# Patient Record
Sex: Female | Born: 1935 | Race: White | Hispanic: No | Smoking: Never smoker
Health system: Southern US, Community
[De-identification: ages and names within clinical notes are randomized; demographics above are authoritative.]

## PROBLEM LIST (undated history)

## (undated) DIAGNOSIS — H409 Unspecified glaucoma: Secondary | ICD-10-CM

## (undated) DIAGNOSIS — T8859XA Other complications of anesthesia, initial encounter: Secondary | ICD-10-CM

## (undated) DIAGNOSIS — T4145XA Adverse effect of unspecified anesthetic, initial encounter: Secondary | ICD-10-CM

## (undated) HISTORY — PX: CHOLECYSTECTOMY: SHX55

## (undated) HISTORY — PX: MASTOIDECTOMY: SHX711

## (undated) HISTORY — PX: ABDOMINAL HYSTERECTOMY: SHX81

---

## 1998-07-04 ENCOUNTER — Ambulatory Visit (HOSPITAL_COMMUNITY): Admission: RE | Admit: 1998-07-04 | Discharge: 1998-07-04 | Payer: Self-pay | Admitting: Obstetrics and Gynecology

## 1998-12-24 ENCOUNTER — Ambulatory Visit (HOSPITAL_COMMUNITY): Admission: RE | Admit: 1998-12-24 | Discharge: 1998-12-24 | Payer: Self-pay | Admitting: *Deleted

## 1999-07-25 ENCOUNTER — Ambulatory Visit (HOSPITAL_COMMUNITY): Admission: RE | Admit: 1999-07-25 | Discharge: 1999-07-25 | Payer: Self-pay | Admitting: Obstetrics and Gynecology

## 2000-08-02 ENCOUNTER — Ambulatory Visit (HOSPITAL_COMMUNITY): Admission: RE | Admit: 2000-08-02 | Discharge: 2000-08-02 | Payer: Self-pay | Admitting: Family Medicine

## 2000-08-09 ENCOUNTER — Encounter: Admission: RE | Admit: 2000-08-09 | Discharge: 2000-08-09 | Payer: Self-pay | Admitting: Family Medicine

## 2000-08-09 ENCOUNTER — Encounter: Payer: Self-pay | Admitting: Family Medicine

## 2001-08-15 ENCOUNTER — Ambulatory Visit (HOSPITAL_COMMUNITY): Admission: RE | Admit: 2001-08-15 | Discharge: 2001-08-15 | Payer: Self-pay | Admitting: Family Medicine

## 2002-09-18 ENCOUNTER — Ambulatory Visit (HOSPITAL_COMMUNITY): Admission: RE | Admit: 2002-09-18 | Discharge: 2002-09-18 | Payer: Self-pay | Admitting: Family Medicine

## 2002-09-18 ENCOUNTER — Encounter: Payer: Self-pay | Admitting: Family Medicine

## 2010-11-08 ENCOUNTER — Encounter: Payer: Self-pay | Admitting: Family Medicine

## 2013-09-28 ENCOUNTER — Emergency Department (HOSPITAL_COMMUNITY): Payer: Medicare Other

## 2013-09-28 ENCOUNTER — Inpatient Hospital Stay (HOSPITAL_COMMUNITY): Payer: Medicare Other

## 2013-09-28 ENCOUNTER — Inpatient Hospital Stay (HOSPITAL_COMMUNITY): Payer: Medicare Other | Admitting: Anesthesiology

## 2013-09-28 ENCOUNTER — Encounter (HOSPITAL_COMMUNITY): Payer: Self-pay | Admitting: Emergency Medicine

## 2013-09-28 ENCOUNTER — Inpatient Hospital Stay (HOSPITAL_COMMUNITY)
Admission: EM | Admit: 2013-09-28 | Discharge: 2013-10-02 | DRG: 470 | Disposition: A | Discharge status: Skilled Nursing Facility | Payer: Medicare Other | Attending: Internal Medicine | Admitting: Internal Medicine

## 2013-09-28 ENCOUNTER — Encounter (HOSPITAL_COMMUNITY): Payer: Medicare Other | Admitting: Anesthesiology

## 2013-09-28 ENCOUNTER — Encounter (HOSPITAL_COMMUNITY)
Admission: EM | Disposition: A | Discharge status: Skilled Nursing Facility | Payer: Self-pay | Source: Home / Self Care | Attending: Internal Medicine

## 2013-09-28 DIAGNOSIS — I10 Essential (primary) hypertension: Secondary | ICD-10-CM | POA: Diagnosis present

## 2013-09-28 DIAGNOSIS — K5900 Constipation, unspecified: Secondary | ICD-10-CM

## 2013-09-28 DIAGNOSIS — Z79899 Other long term (current) drug therapy: Secondary | ICD-10-CM

## 2013-09-28 DIAGNOSIS — N390 Urinary tract infection, site not specified: Secondary | ICD-10-CM | POA: Diagnosis present

## 2013-09-28 DIAGNOSIS — Z23 Encounter for immunization: Secondary | ICD-10-CM

## 2013-09-28 DIAGNOSIS — S72033A Displaced midcervical fracture of unspecified femur, initial encounter for closed fracture: Principal | ICD-10-CM | POA: Diagnosis present

## 2013-09-28 DIAGNOSIS — Y92009 Unspecified place in unspecified non-institutional (private) residence as the place of occurrence of the external cause: Secondary | ICD-10-CM

## 2013-09-28 DIAGNOSIS — H409 Unspecified glaucoma: Secondary | ICD-10-CM | POA: Diagnosis present

## 2013-09-28 DIAGNOSIS — S72009A Fracture of unspecified part of neck of unspecified femur, initial encounter for closed fracture: Secondary | ICD-10-CM

## 2013-09-28 DIAGNOSIS — S72002P Fracture of unspecified part of neck of left femur, subsequent encounter for closed fracture with malunion: Secondary | ICD-10-CM

## 2013-09-28 DIAGNOSIS — R296 Repeated falls: Secondary | ICD-10-CM | POA: Diagnosis present

## 2013-09-28 HISTORY — DX: Other complications of anesthesia, initial encounter: T88.59XA

## 2013-09-28 HISTORY — DX: Unspecified glaucoma: H40.9

## 2013-09-28 HISTORY — PX: HIP ARTHROPLASTY: SHX981

## 2013-09-28 HISTORY — DX: Adverse effect of unspecified anesthetic, initial encounter: T41.45XA

## 2013-09-28 LAB — CBC WITH DIFFERENTIAL/PLATELET
Basophils Absolute: 0 10*3/uL (ref 0.0–0.1)
Basophils Relative: 0 % (ref 0–1)
Eosinophils Absolute: 0 10*3/uL (ref 0.0–0.7)
HCT: 40 % (ref 36.0–46.0)
Hemoglobin: 13.3 g/dL (ref 12.0–15.0)
Lymphocytes Relative: 8 % — ABNORMAL LOW (ref 12–46)
Lymphs Abs: 1 10*3/uL (ref 0.7–4.0)
MCHC: 33.3 g/dL (ref 30.0–36.0)
Monocytes Absolute: 0.6 10*3/uL (ref 0.1–1.0)
Neutrophils Relative %: 87 % — ABNORMAL HIGH (ref 43–77)

## 2013-09-28 LAB — BASIC METABOLIC PANEL
BUN: 20 mg/dL (ref 6–23)
CO2: 24 mEq/L (ref 19–32)
GFR calc Af Amer: >90 mL/min (ref >90)
GFR calc non Af Amer: 81 mL/min — ABNORMAL LOW (ref >90)
Potassium: 3.4 mEq/L — ABNORMAL LOW (ref 3.5–5.1)
Sodium: 143 mEq/L (ref 135–145)

## 2013-09-28 LAB — ABO/RH: ABO/RH(D): O POS

## 2013-09-28 LAB — URINALYSIS, ROUTINE W REFLEX MICROSCOPIC
Bilirubin Urine: NEGATIVE
Glucose, UA: NEGATIVE mg/dL
Hgb urine dipstick: NEGATIVE
Ketones, ur: NEGATIVE mg/dL
pH: 7 (ref 5.0–8.0)

## 2013-09-28 LAB — PROTIME-INR
INR: 0.89 (ref 0.00–1.49)
Prothrombin Time: 11.9 seconds (ref 11.6–15.2)

## 2013-09-28 LAB — APTT: aPTT: 29 seconds (ref 24–37)

## 2013-09-28 LAB — URINE MICROSCOPIC-ADD ON

## 2013-09-28 LAB — TYPE AND SCREEN

## 2013-09-28 SURGERY — HEMIARTHROPLASTY, HIP, DIRECT ANTERIOR APPROACH, FOR FRACTURE
Anesthesia: General | Site: Hip | Laterality: Left

## 2013-09-28 MED ORDER — GLYCOPYRROLATE 0.2 MG/ML IJ SOLN
INTRAMUSCULAR | Status: DC | PRN
  Administered 2013-09-28: 0.4 mg via INTRAVENOUS

## 2013-09-28 MED ORDER — INFLUENZA VAC SPLIT QUAD 0.5 ML IM SUSP: 0.5000 mL | INTRAMUSCULAR | Status: DC

## 2013-09-28 MED ORDER — HYDRALAZINE HCL 20 MG/ML IJ SOLN
INTRAMUSCULAR | Status: AC
  Filled 2013-09-28: qty 2

## 2013-09-28 MED ORDER — ONDANSETRON HCL 4 MG PO TABS: 4.0000 mg | ORAL_TABLET | Freq: Four times a day (QID) | ORAL | Status: DC | PRN

## 2013-09-28 MED ORDER — EPHEDRINE SULFATE 50 MG/ML IJ SOLN
INTRAMUSCULAR | Status: AC
  Filled 2013-09-28: qty 1

## 2013-09-28 MED ORDER — SUCCINYLCHOLINE CHLORIDE 20 MG/ML IJ SOLN
INTRAMUSCULAR | Status: AC
  Filled 2013-09-28: qty 1

## 2013-09-28 MED ORDER — DEXTROSE 5 % IV SOLN
1.0000 g | INTRAVENOUS | Status: DC
  Administered 2013-09-28: 1 g via INTRAVENOUS
  Filled 2013-09-28: qty 10

## 2013-09-28 MED ORDER — HYDROCODONE-ACETAMINOPHEN 5-325 MG PO TABS: 1.0000 | ORAL_TABLET | Freq: Four times a day (QID) | ORAL | Status: DC | PRN

## 2013-09-28 MED ORDER — HYDROCODONE-ACETAMINOPHEN 5-325 MG PO TABS: 1.0000 | ORAL_TABLET | ORAL | Status: DC | PRN

## 2013-09-28 MED ORDER — CEFAZOLIN SODIUM-DEXTROSE 2-3 GM-% IV SOLR: INTRAVENOUS | Status: DC | PRN

## 2013-09-28 MED ORDER — ONDANSETRON HCL 4 MG/2ML IJ SOLN: 4.0000 mg | Freq: Four times a day (QID) | INTRAMUSCULAR | Status: DC | PRN

## 2013-09-28 MED ORDER — CEFAZOLIN SODIUM-DEXTROSE 2-3 GM-% IV SOLR: 2.0000 g | Freq: Once | INTRAVENOUS | Status: DC

## 2013-09-28 MED ORDER — CEFAZOLIN SODIUM-DEXTROSE 2-3 GM-% IV SOLR
INTRAVENOUS | Status: DC | PRN
  Administered 2013-09-28: 2 g via INTRAVENOUS

## 2013-09-28 MED ORDER — SODIUM CHLORIDE 0.9 % IJ SOLN
INTRAMUSCULAR | Status: AC
  Filled 2013-09-28: qty 10

## 2013-09-28 MED ORDER — ENOXAPARIN SODIUM 30 MG/0.3ML ~~LOC~~ SOLN: 40.0000 mg | SUBCUTANEOUS | Status: DC

## 2013-09-28 MED ORDER — FENTANYL CITRATE 0.05 MG/ML IJ SOLN
INTRAMUSCULAR | Status: AC
  Filled 2013-09-28: qty 2

## 2013-09-28 MED ORDER — ACETAMINOPHEN 325 MG PO TABS
650.0000 mg | ORAL_TABLET | Freq: Four times a day (QID) | ORAL | Status: DC | PRN
  Administered 2013-09-29 – 2013-10-01 (×6): 650 mg via ORAL
  Filled 2013-09-28 (×5): qty 2

## 2013-09-28 MED ORDER — BRINZOLAMIDE 1 % OP SUSP
1.0000 [drp] | Freq: Two times a day (BID) | OPHTHALMIC | Status: DC
  Administered 2013-09-28 – 2013-10-02 (×6): 1 [drp] via OPHTHALMIC
  Filled 2013-09-28: qty 10

## 2013-09-28 MED ORDER — ACETAMINOPHEN 650 MG RE SUPP: 650.0000 mg | Freq: Four times a day (QID) | RECTAL | Status: DC | PRN

## 2013-09-28 MED ORDER — PROPOFOL 10 MG/ML IV BOLUS
INTRAVENOUS | Status: AC
  Filled 2013-09-28: qty 20

## 2013-09-28 MED ORDER — 0.9 % SODIUM CHLORIDE (POUR BTL) OPTIME
TOPICAL | Status: DC | PRN
  Administered 2013-09-28: 1000 mL

## 2013-09-28 MED ORDER — NEOSTIGMINE METHYLSULFATE 1 MG/ML IJ SOLN
INTRAMUSCULAR | Status: DC | PRN
  Administered 2013-09-28: 3 mg via INTRAVENOUS

## 2013-09-28 MED ORDER — HYDRALAZINE HCL 20 MG/ML IJ SOLN: 5.0000 mg | Freq: Three times a day (TID) | INTRAMUSCULAR | Status: DC | PRN

## 2013-09-28 MED ORDER — LACTATED RINGERS IV SOLN
INTRAVENOUS | Status: DC | PRN
  Administered 2013-09-28: 19:00:00 via INTRAVENOUS

## 2013-09-28 MED ORDER — CEFAZOLIN SODIUM 1-5 GM-% IV SOLN
1.0000 g | Freq: Four times a day (QID) | INTRAVENOUS | Status: AC
  Administered 2013-09-28 – 2013-09-29 (×2): 1 g via INTRAVENOUS
  Filled 2013-09-28 (×2): qty 50

## 2013-09-28 MED ORDER — PHENYLEPHRINE 40 MCG/ML (10ML) SYRINGE FOR IV PUSH (FOR BLOOD PRESSURE SUPPORT)
PREFILLED_SYRINGE | INTRAVENOUS | Status: AC
  Filled 2013-09-28: qty 10

## 2013-09-28 MED ORDER — FENTANYL CITRATE 0.05 MG/ML IJ SOLN
25.0000 ug | INTRAMUSCULAR | Status: DC | PRN
Start: 2013-09-28 — End: 2013-09-28

## 2013-09-28 MED ORDER — BUPIVACAINE HCL 0.5 % IJ SOLN
INTRAMUSCULAR | Status: DC | PRN
  Administered 2013-09-28: 5 mL

## 2013-09-28 MED ORDER — CEFAZOLIN SODIUM-DEXTROSE 2-3 GM-% IV SOLR
INTRAVENOUS | Status: AC
  Filled 2013-09-28: qty 50

## 2013-09-28 MED ORDER — FENTANYL CITRATE 0.05 MG/ML IJ SOLN
INTRAMUSCULAR | Status: DC | PRN
  Administered 2013-09-28 (×4): 50 ug via INTRAVENOUS

## 2013-09-28 MED ORDER — BUPIVACAINE HCL (PF) 0.5 % IJ SOLN
INTRAMUSCULAR | Status: AC
  Filled 2013-09-28: qty 30

## 2013-09-28 MED ORDER — LIDOCAINE HCL (CARDIAC) 20 MG/ML IV SOLN
INTRAVENOUS | Status: DC | PRN
  Administered 2013-09-28: 65 mg via INTRAVENOUS

## 2013-09-28 MED ORDER — METOCLOPRAMIDE HCL 10 MG PO TABS: 5.0000 mg | ORAL_TABLET | Freq: Three times a day (TID) | ORAL | Status: DC | PRN

## 2013-09-28 MED ORDER — MORPHINE SULFATE 2 MG/ML IJ SOLN
0.5000 mg | INTRAMUSCULAR | Status: DC | PRN
Start: 2013-09-28 — End: 2013-10-02

## 2013-09-28 MED ORDER — LABETALOL HCL 5 MG/ML IV SOLN
INTRAVENOUS | Status: AC
  Filled 2013-09-28: qty 4

## 2013-09-28 MED ORDER — METOCLOPRAMIDE HCL 5 MG/ML IJ SOLN: 5.0000 mg | Freq: Three times a day (TID) | INTRAMUSCULAR | Status: DC | PRN

## 2013-09-28 MED ORDER — ENOXAPARIN SODIUM 40 MG/0.4ML ~~LOC~~ SOLN
40.0000 mg | SUBCUTANEOUS | Status: DC
  Administered 2013-09-29 – 2013-10-01 (×3): 40 mg via SUBCUTANEOUS
  Filled 2013-09-28 (×5): qty 0.4

## 2013-09-28 MED ORDER — PROPOFOL 10 MG/ML IV BOLUS
INTRAVENOUS | Status: DC | PRN
  Administered 2013-09-28: 100 mg via INTRAVENOUS

## 2013-09-28 MED ORDER — ENOXAPARIN SODIUM 40 MG/0.4ML ~~LOC~~ SOLN
40.0000 mg | SUBCUTANEOUS | Status: DC
  Filled 2013-09-28 (×2): qty 0.4

## 2013-09-28 MED ORDER — PHENOL 1.4 % MT LIQD: 1.0000 | OROMUCOSAL | Status: DC | PRN

## 2013-09-28 MED ORDER — POTASSIUM CHLORIDE CRYS ER 20 MEQ PO TBCR
40.0000 meq | EXTENDED_RELEASE_TABLET | Freq: Once | ORAL | Status: AC
  Administered 2013-09-28: 40 meq via ORAL
  Filled 2013-09-28: qty 2

## 2013-09-28 MED ORDER — EPHEDRINE SULFATE 50 MG/ML IJ SOLN
INTRAMUSCULAR | Status: DC | PRN
  Administered 2013-09-28 (×2): 10 mg via INTRAVENOUS

## 2013-09-28 MED ORDER — PHENYLEPHRINE HCL 10 MG/ML IJ SOLN
INTRAMUSCULAR | Status: DC | PRN
  Administered 2013-09-28: 20 ug via INTRAVENOUS

## 2013-09-28 MED ORDER — HYDRALAZINE HCL 10 MG PO TABS
10.0000 mg | ORAL_TABLET | Freq: Three times a day (TID) | ORAL | Status: DC
  Administered 2013-09-28 – 2013-09-29 (×2): 10 mg via ORAL
  Filled 2013-09-28 (×5): qty 1

## 2013-09-28 MED ORDER — MORPHINE SULFATE 2 MG/ML IJ SOLN: 0.5000 mg | INTRAMUSCULAR | Status: DC | PRN

## 2013-09-28 MED ORDER — CISATRACURIUM BESYLATE (PF) 10 MG/5ML IV SOLN
INTRAVENOUS | Status: DC | PRN
  Administered 2013-09-28: 3 mg via INTRAVENOUS

## 2013-09-28 MED ORDER — MENTHOL 3 MG MT LOZG: 1.0000 | LOZENGE | OROMUCOSAL | Status: DC | PRN

## 2013-09-28 SURGICAL SUPPLY — 54 items
BAG SPEC THK2 15X12 ZIP CLS (MISCELLANEOUS)
BAG ZIPLOCK 12X15 (MISCELLANEOUS) IMPLANT
BLADE SAW SGTL 18X1.27X75 (BLADE) ×2 IMPLANT
BRUSH FEMORAL CANAL (MISCELLANEOUS) ×1 IMPLANT
CAPT HIP HD POR BIPOL/UNIPOL ×1 IMPLANT
CHLORAPREP W/TINT 26ML (MISCELLANEOUS) IMPLANT
CLOTH 2% CHLOROHEXIDINE 3PK (PERSONAL CARE ITEMS) ×2 IMPLANT
DRAPE INCISE IOBAN 66X45 STRL (DRAPES) ×2 IMPLANT
DRAPE ORTHO SPLIT 77X108 STRL (DRAPES) ×4
DRAPE POUCH INSTRU U-SHP 10X18 (DRAPES) ×2 IMPLANT
DRAPE SURG ORHT 6 SPLT 77X108 (DRAPES) ×1 IMPLANT
DRAPE U-SHAPE 47X51 STRL (DRAPES) ×2 IMPLANT
DRSG AQUACEL AG ADV 3.5X 6 (GAUZE/BANDAGES/DRESSINGS) ×1 IMPLANT
DRSG EMULSION OIL 3X16 NADH (GAUZE/BANDAGES/DRESSINGS) IMPLANT
DURAPREP 26ML APPLICATOR (WOUND CARE) ×2 IMPLANT
ELECT REM PT RETURN 9FT ADLT (ELECTROSURGICAL) ×2
ELECTRODE REM PT RTRN 9FT ADLT (ELECTROSURGICAL) ×1 IMPLANT
EVACUATOR 1/8 PVC DRAIN (DRAIN) ×1 IMPLANT
FACESHIELD LNG OPTICON STERILE (SAFETY) ×4 IMPLANT
GLOVE BIOGEL PI IND STRL 7.0 (GLOVE) IMPLANT
GLOVE BIOGEL PI IND STRL 7.5 (GLOVE) ×1 IMPLANT
GLOVE BIOGEL PI IND STRL 8 (GLOVE) ×1 IMPLANT
GLOVE BIOGEL PI INDICATOR 7.0 (GLOVE) ×2
GLOVE BIOGEL PI INDICATOR 7.5 (GLOVE) ×1
GLOVE BIOGEL PI INDICATOR 8 (GLOVE) ×1
GLOVE SURG SS PI 6.5 STRL IVOR (GLOVE) ×1 IMPLANT
GLOVE SURG SS PI 7.5 STRL IVOR (GLOVE) ×4 IMPLANT
GLOVE SURG SS PI 8.0 STRL IVOR (GLOVE) ×4 IMPLANT
GOWN PREVENTION PLUS LG XLONG (DISPOSABLE) ×3 IMPLANT
GOWN PREVENTION PLUS XLARGE (GOWN DISPOSABLE) IMPLANT
GOWN STRL NON-REIN LRG LVL3 (GOWN DISPOSABLE) ×2 IMPLANT
GOWN STRL REIN XL XLG (GOWN DISPOSABLE) ×3 IMPLANT
HANDPIECE INTERPULSE COAX TIP (DISPOSABLE)
IMMOBILIZER KNEE 20 (SOFTGOODS) ×2
IMMOBILIZER KNEE 20 THIGH 36 (SOFTGOODS) IMPLANT
KIT BASIN OR (CUSTOM PROCEDURE TRAY) ×2 IMPLANT
MANIFOLD NEPTUNE II (INSTRUMENTS) ×2 IMPLANT
NS IRRIG 1000ML POUR BTL (IV SOLUTION) ×2 IMPLANT
PACK TOTAL JOINT (CUSTOM PROCEDURE TRAY) ×2 IMPLANT
PAD ABD 8X10 STRL (GAUZE/BANDAGES/DRESSINGS) ×1 IMPLANT
POSITIONER SURGICAL ARM (MISCELLANEOUS) ×2 IMPLANT
SET HNDPC FAN SPRY TIP SCT (DISPOSABLE) ×1 IMPLANT
SPONGE GAUZE 4X4 12PLY (GAUZE/BANDAGES/DRESSINGS) ×1 IMPLANT
STAPLER VISISTAT (STAPLE) ×2 IMPLANT
SUT ETHIBOND NAB CT1 #1 30IN (SUTURE) ×8 IMPLANT
SUT VIC AB 0 CT1 27 (SUTURE) ×4
SUT VIC AB 0 CT1 27XBRD ANTBC (SUTURE) ×2 IMPLANT
SUT VIC AB 1 CT1 27 (SUTURE) ×8
SUT VIC AB 1 CT1 27XBRD ANTBC (SUTURE) ×4 IMPLANT
SUT VIC AB 2-0 CT1 27 (SUTURE) ×4
SUT VIC AB 2-0 CT1 TAPERPNT 27 (SUTURE) ×2 IMPLANT
TOWER CARTRIDGE SMART MIX (DISPOSABLE) IMPLANT
TRAY FOLEY CATH 14FRSI W/METER (CATHETERS) ×2 IMPLANT
WATER STERILE IRR 1500ML POUR (IV SOLUTION) ×2 IMPLANT

## 2013-09-28 NOTE — Brief Op Note (Signed)
09/28/2013  8:14 PM  PATIENT:  Julia Hampton  79 y.o. female  PRE-OPERATIVE DIAGNOSIS:  LEFT FEMUR FRACTURE  POST-OPERATIVE DIAGNOSIS:  LEFT FEMUR FRACTURE  PROCEDURE:  Procedure(s): ARTHROPLASTY BIPOLAR HIP (Left)  SURGEON:  Surgeon(s) and Role:    * Javier Docker, MD - Primary  PHYSICIAN ASSISTANT:   ASSISTANTS: Bissell   ANESTHESIA:   general  EBL:     BLOOD ADMINISTERED:none  DRAINS: none   LOCAL MEDICATIONS USED:  MARCAINE     SPECIMEN:  No Specimen  DISPOSITION OF SPECIMEN:  N/A  COUNTS:  YES  TOURNIQUET:  * No tourniquets in log *  DICTATION: .Other Dictation: Dictation Number [id number]  PLAN OF CARE: Admit to inpatient   PATIENT DISPOSITION:  PACU - hemodynamically stable.   Delay start of Pharmacological VTE agent (>24hrs) due to surgical blood loss or risk of bleeding: no

## 2013-09-28 NOTE — ED Notes (Signed)
Foley Cath was not yet placed as pt requested to use the bedpan and did so w/o difficulty

## 2013-09-28 NOTE — Progress Notes (Signed)
Pt arrived to room 1618 from ED. Report received from Pence.

## 2013-09-28 NOTE — ED Notes (Signed)
Bed: EA54 Expected date:  Expected time:  Means of arrival:  Comments: EMS-left hip pain-fall

## 2013-09-28 NOTE — ED Notes (Signed)
Pt was on a walk with her new puppy who pulled her causing her to fall. C/O LT hip pain w/o any tenderness, rotation or shortening per EMS.  NO LOC or other areas of injury other than an abrasion to LT elbow.  PT c/o increased pain upon weight bearing.  A&Ox4.

## 2013-09-28 NOTE — Anesthesia Postprocedure Evaluation (Signed)
  Anesthesia Post-op Note  Patient: Julia Hampton  Procedure(s) Performed: Procedure(s) (LRB): ARTHROPLASTY BIPOLAR HIP (Left)  Patient Location: PACU  Anesthesia Type: General  Level of Consciousness: awake and alert   Airway and Oxygen Therapy: Patient Spontanous Breathing  Post-op Pain: mild  Post-op Assessment: Post-op Vital signs reviewed, Patient's Cardiovascular Status Stable, Respiratory Function Stable, Patent Airway and No signs of Nausea or vomiting  Last Vitals:  Filed Vitals:   09/28/13 2100  BP:   Pulse:   Temp: 36.4 C  Resp:     Post-op Vital Signs: stable   Complications: No apparent anesthesia complications

## 2013-09-28 NOTE — ED Provider Notes (Signed)
CSN: [id number]     Arrival date & time 09/28/13  1239 History   First MD Initiated Contact with Patient 09/28/13 1407     Chief Complaint  Patient presents with  . Fall  . Hip Pain   (Consider location/radiation/quality/duration/timing/severity/associated sxs/prior Treatment) Patient is a 79 y.o. female presenting with fall and hip pain. The history is provided by the patient.  Fall This is a new problem.  Hip Pain   patient presents after a fall. She states that came to the posterior dogs leash and fell onto her left hip. She's been unable to walk since. She did not hit her head. She does not have any other pains. No numbness or weakness. No chest or abdominal pain. No difficulty breathing. She is not eaten lunch yesterday.   Past Medical History  Diagnosis Date  . Glaucoma   . Complication of anesthesia     trouble waking patient up   Past Surgical History  Procedure Laterality Date  . Cholecystectomy    . Abdominal hysterectomy    . Mastoidectomy     History reviewed. No pertinent family history. History  Substance Use Topics  . Smoking status: Never Smoker   . Smokeless tobacco: Never Used  . Alcohol Use: No   OB History   Grav Para Term Preterm Abortions TAB SAB Ect Mult Living                 Review of Systems  Allergies  Review of patient's allergies indicates no known allergies.  Home Medications   Current Outpatient Rx  Name  Route  Sig  Dispense  Refill  . acetaminophen (TYLENOL) 500 MG tablet   Oral   Take 500 mg by mouth every 6 (six) hours as needed.         . bimatoprost (LUMIGAN) 0.01 % SOLN   Both Eyes   Place 1 drop into both eyes at bedtime.         . brinzolamide (AZOPT) 1 % ophthalmic suspension   Both Eyes   Place 1 drop into both eyes 2 (two) times daily.           BP 187/75  Pulse 87  Temp(Src) 97.8 F (36.6 C) (Oral)  Resp 16  SpO2 97% Physical Exam  Nursing note and vitals reviewed. Constitutional: She is oriented  to person, place, and time. She appears well-developed and well-nourished.  HENT:  Head: Normocephalic and atraumatic.  Eyes: EOM are normal. Pupils are equal, round, and reactive to light.  Neck: Normal range of motion. Neck supple.  Cardiovascular: Normal rate, regular rhythm and normal heart sounds.   No murmur heard. Pulmonary/Chest: Effort normal and breath sounds normal. No respiratory distress. She has no wheezes. She has no rales.  Abdominal: Soft. Bowel sounds are normal. She exhibits no distension. There is no tenderness. There is no rebound and no guarding.  Musculoskeletal:  Tenderness the left hip with shortening of the leg. Neurovascular intact distally. Decreased range of motion at the hip.  Neurological: She is alert and oriented to person, place, and time. No cranial nerve deficit.  Skin: Skin is warm and dry.  Psychiatric: She has a normal mood and affect. Her speech is normal.    ED Course  Procedures (including critical care time) Labs Review Labs Reviewed  URINALYSIS, ROUTINE W REFLEX MICROSCOPIC - Abnormal; Notable for the following:    APPearance CLOUDY (*)    Leukocytes, UA MODERATE (*)    All other components  within normal limits  PROTIME-INR  URINE MICROSCOPIC-ADD ON  BASIC METABOLIC PANEL  CBC WITH DIFFERENTIAL  TYPE AND SCREEN   Imaging Review Dg Hip Complete Left  09/28/2013   CLINICAL DATA:  Status post fall.  Left hip pain.  EXAM: LEFT HIP - COMPLETE 2+ VIEW  COMPARISON:  None.  FINDINGS: The patient has a subcapital left hip fracture. No other acute bony or joint abnormality is identified.  IMPRESSION: Acute subcapital left hip fracture.   Electronically Signed   By: Drusilla Kanner M.D.   On: 09/28/2013 14:13   Dg Chest Port 1 View  09/28/2013   CLINICAL DATA:  Left hip fracture. Pre-op respiratory exam.  EXAM: PORTABLE CHEST - 1 VIEW  COMPARISON:  None.  FINDINGS: The heart size and mediastinal contours are within normal limits. Both lungs are  clear. The visualized skeletal structures are unremarkable.  IMPRESSION: No active disease.   Electronically Signed   By: Myles Rosenthal M.D.   On: 09/28/2013 14:48    EKG Interpretation    Date/Time:  Thursday September 28 2013 14:26:15 EST Ventricular Rate:  101 PR Interval:  186 QRS Duration: 84 QT Interval:  371 QTC Calculation: 481 R Axis:   37 Text Interpretation:  Sinus tachycardia Confirmed by Izaan Kingbird  MD, Ninamarie Keel ([id number]) on 09/28/2013 3:14:42 PM            MDM   1. Hip fracture, unspecified laterality, closed, initial encounter    Patient with mechanical fall. Left subcapital hip fracture. Discussed with Dr. Shelle Iron, who will take patient to the OR, possibly today. Discussed with internal medicine who will admit the patient.    Juliet Rude. Rubin Payor, MD 09/28/13 1531

## 2013-09-28 NOTE — H&P (Signed)
[facility] History and Physical  Julia Hampton ZOX:[id number] DOB: [birth year] DOA: 09/28/2013  Referring physician: ED physician PCP: No primary provider on file.   Chief Complaint: left hip pain   HPI:  Pt is 79 yo female who presented to [facility] ED after an episode of fall at home, she fell on her left hip and has not been able to bear weight and walk. She denies any specific symptoms prior to the fall, no chest pain, no shortness of breath, no abdominal or urinary concerns, no specific focal neurological symptoms. In ED, she was found to have sustained left hip fracture and ortho team was consulted for further evaluation and management. [facility] asked to admit to medical floor.   Assessment and Plan:  Principal Problem:   Hip fracture - appreciate ortho input, plan for OR tonight - will place order for admission to medical floor - provide analgesia as needed - will need PT evaluation likely as early as tomorrow - will most likely need SNF for further rehabilitation  Active Problems:   HTN (hypertension) - accelerated - will place on Hydralazine scheduled and as needed   UTI (urinary tract infection) - suggestive based on UA but pt denies specific symptoms - will place on empiric ABX - obtain urine culture   Code Status: Full Family Communication: Pt at bedside Disposition Plan: Admit to medical floor    Review of Systems:  Constitutional: Negative for fever, chills and malaise/fatigue. Negative for diaphoresis.  HENT: Negative for hearing loss, ear pain, nosebleeds, congestion, sore throat, neck pain, tinnitus and ear discharge.   Eyes: Negative for blurred vision, double vision, photophobia, pain, discharge and redness.  Respiratory: Negative for cough, hemoptysis, sputum production, shortness of breath, wheezing and stridor.   Cardiovascular: Negative for chest pain, palpitations, orthopnea, claudication and leg swelling.  Gastrointestinal: Negative for nausea, vomiting  and abdominal pain. Negative for heartburn, constipation, blood in stool and melena.  Genitourinary: Negative for dysuria, urgency, frequency, hematuria and flank pain.  Musculoskeletal: Negative for myalgias.  Skin: Negative for itching and rash.  Neurological: Negative for tingling, tremors, sensory change, speech change, focal weakness, loss of consciousness and headaches.  Endo/Heme/Allergies: Negative for environmental allergies and polydipsia. Does not bruise/bleed easily.  Psychiatric/Behavioral: Negative for suicidal ideas. The patient is not nervous/anxious.      Past Medical History  Diagnosis Date  . Glaucoma   . Complication of anesthesia     trouble waking patient up    Past Surgical History  Procedure Laterality Date  . Cholecystectomy    . Abdominal hysterectomy    . Mastoidectomy      Social History:  reports that she has never smoked. She has never used smokeless tobacco. She reports that she does not drink alcohol or use illicit drugs.  No Known Allergies  History reviewed. No pertinent family history.  Prior to Admission medications   Medication Sig Start Date End Date Taking? Authorizing Provider  acetaminophen (TYLENOL) 500 MG tablet Take 500 mg by mouth every 6 (six) hours as needed.   Yes Historical Provider, MD  bimatoprost (LUMIGAN) 0.01 % SOLN Place 1 drop into both eyes at bedtime.   Yes Historical Provider, MD  brinzolamide (AZOPT) 1 % ophthalmic suspension Place 1 drop into both eyes 2 (two) times daily.    Yes Historical Provider, MD    Physical Exam: Filed Vitals:   09/28/13 1240 09/28/13 1300  BP: 207/78 187/75  Pulse: 80 87  Temp: 97.8 F (36.6 C)  TempSrc: Oral   Resp: 16   SpO2: 99% 97%    Physical Exam  Constitutional: Appears well-developed and well-nourished. No distress.  HENT: Normocephalic. External right and left ear normal. Oropharynx is clear and moist.  Eyes: Conjunctivae and EOM are normal. PERRLA, no scleral icterus.   Neck: Normal ROM. Neck supple. No JVD. No tracheal deviation. No thyromegaly.  CVS: RRR, S1/S2 +, no murmurs, no gallops, no carotid bruit.  Pulmonary: Effort and breath sounds normal, no stridor, rhonchi, wheezes, rales.  Abdominal: Soft. BS +,  no distension, tenderness, rebound or guarding.  Musculoskeletal: Normal range of motion. Tenderness over the left hip area.   Lymphadenopathy: No lymphadenopathy noted, cervical, inguinal. Neuro: Alert. Normal reflexes, muscle tone coordination. No cranial nerve deficit. Skin: Skin is warm and dry. No rash noted. Not diaphoretic. No erythema. No pallor.  Psychiatric: Normal mood and affect. Behavior, judgment, thought content normal.   Labs on Admission:  Basic Metabolic Panel: No results found for this basename: NA, K, CL, CO2, GLUCOSE, BUN, CREATININE, CALCIUM, MG, PHOS,  in the last 168 hours Liver Function Tests: No results found for this basename: AST, ALT, ALKPHOS, BILITOT, PROT, ALBUMIN,  in the last 168 hours No results found for this basename: LIPASE, AMYLASE,  in the last 168 hours No results found for this basename: AMMONIA,  in the last 168 hours CBC: No results found for this basename: WBC, NEUTROABS, HGB, HCT, MCV, PLT,  in the last 168 hours Cardiac Enzymes: No results found for this basename: CKTOTAL, CKMB, CKMBINDEX, TROPONINI,  in the last 168 hours BNP: No components found with this basename: POCBNP,  CBG: No results found for this basename: GLUCAP,  in the last 168 hours  Radiological Exams on Admission: Dg Hip Complete Left  09/28/2013   CLINICAL DATA:  Status post fall.  Left hip pain.  EXAM: LEFT HIP - COMPLETE 2+ VIEW  COMPARISON:  None.  FINDINGS: The patient has a subcapital left hip fracture. No other acute bony or joint abnormality is identified.  IMPRESSION: Acute subcapital left hip fracture.   Electronically Signed   By: Drusilla Kanner M.D.   On: 09/28/2013 14:13   Dg Chest Port 1 View  09/28/2013    CLINICAL DATA:  Left hip fracture. Pre-op respiratory exam.  EXAM: PORTABLE CHEST - 1 VIEW  COMPARISON:  None.  FINDINGS: The heart size and mediastinal contours are within normal limits. Both lungs are clear. The visualized skeletal structures are unremarkable.  IMPRESSION: No active disease.   Electronically Signed   By: Myles Rosenthal M.D.   On: 09/28/2013 14:48    EKG: Normal sinus rhythm, no ST/T wave changes  Debbora Presto, MD  [facility] Pager [phone]  If 7PM-7AM, please contact night-coverage [email or web address] Password [facility] 09/28/2013, 3:28 PM

## 2013-09-28 NOTE — Consult Note (Signed)
Reason for Consult: left hip  Pain   Referring Physician: EDP  Julia Hampton is an 79 y.o. female.  HPI: Larey Seat today No LOC. Hampton/O left hip pain  Past Medical History  Diagnosis Date  . Glaucoma   . Complication of anesthesia     trouble waking patient up    Past Surgical History  Procedure Laterality Date  . Cholecystectomy    . Abdominal hysterectomy    . Mastoidectomy      History reviewed. No pertinent family history.  Social History:  reports that she has never smoked. She has never used smokeless tobacco. She reports that she does not drink alcohol or use illicit drugs.  Allergies: No Known Allergies  Medications: I have reviewed the patient's current medications.  Results for orders placed during the hospital encounter of 09/28/13 (from the past 48 hour(s))  URINALYSIS, ROUTINE W REFLEX MICROSCOPIC     Status: Abnormal   Collection Time    09/28/13  2:39 PM      Result Value Range   Color, Urine YELLOW  YELLOW   APPearance CLOUDY (*) CLEAR   Specific Gravity, Urine 1.008  1.005 - 1.030   pH 7.0  5.0 - 8.0   Glucose, UA NEGATIVE  NEGATIVE mg/dL   Hgb urine dipstick NEGATIVE  NEGATIVE   Bilirubin Urine NEGATIVE  NEGATIVE   Ketones, ur NEGATIVE  NEGATIVE mg/dL   Protein, ur NEGATIVE  NEGATIVE mg/dL   Urobilinogen, UA 0.2  0.0 - 1.0 mg/dL   Nitrite NEGATIVE  NEGATIVE   Leukocytes, UA MODERATE (*) NEGATIVE  URINE MICROSCOPIC-ADD ON     Status: None   Collection Time    09/28/13  2:39 PM      Result Value Range   Squamous Epithelial / LPF RARE  RARE   WBC, UA 7-10  <3 WBC/hpf   Bacteria, UA RARE  RARE  BASIC METABOLIC PANEL     Status: Abnormal   Collection Time    09/28/13  2:56 PM      Result Value Range   Sodium 143  135 - 145 mEq/L   Potassium 3.4 (*) 3.5 - 5.1 mEq/L   Chloride 107  96 - 112 mEq/L   CO2 24  19 - 32 mEq/L   Glucose, Bld 95  70 - 99 mg/dL   BUN 20  6 - 23 mg/dL   Creatinine, Ser 1.61  0.50 - 1.10 mg/dL   Calcium 9.5  8.4 - 09.6 mg/dL   GFR calc non Af Amer 81 (*) >90 mL/min   GFR calc Af Amer >90  >90 mL/min   Comment: (NOTE)     The eGFR has been calculated using the CKD EPI equation.     This calculation has not been validated in all clinical situations.     eGFR's persistently <90 mL/min signify possible Chronic Kidney     Disease.  CBC WITH DIFFERENTIAL     Status: Abnormal   Collection Time    09/28/13  2:56 PM      Result Value Range   WBC 12.3 (*) 4.0 - 10.5 K/uL   RBC 4.41  3.87 - 5.11 MIL/uL   Hemoglobin 13.3  12.0 - 15.0 g/dL   HCT 04.5  40.9 - 81.1 %   MCV 90.7  78.0 - 100.0 fL   MCH 30.2  26.0 - 34.0 pg   MCHC 33.3  30.0 - 36.0 g/dL   RDW 91.4  78.2 - 95.6 %  Platelets 228  150 - 400 K/uL   Neutrophils Relative % 87 (*) 43 - 77 %   Lymphocytes Relative 8 (*) 12 - 46 %   Monocytes Relative 5  3 - 12 %   Eosinophils Relative 0  0 - 5 %   Basophils Relative 0  0 - 1 %   Neutro Abs 10.7 (*) 1.7 - 7.7 K/uL   Lymphs Abs 1.0  0.7 - 4.0 K/uL   Monocytes Absolute 0.6  0.1 - 1.0 K/uL   Eosinophils Absolute 0.0  0.0 - 0.7 K/uL   Basophils Absolute 0.0  0.0 - 0.1 K/uL   Smear Review MORPHOLOGY UNREMARKABLE    PROTIME-INR     Status: None   Collection Time    09/28/13  2:56 PM      Result Value Range   Prothrombin Time 11.9  11.6 - 15.2 seconds   INR 0.89  0.00 - 1.49  TYPE AND SCREEN     Status: None   Collection Time    09/28/13  2:56 PM      Result Value Range   ABO/RH(D) O POS     Antibody Screen NEG     Sample Expiration 10/01/2013    APTT     Status: None   Collection Time    09/28/13  3:00 PM      Result Value Range   aPTT 29  24 - 37 seconds    Dg Hip Complete Left  09/28/2013   CLINICAL DATA:  Status post fall.  Left hip pain.  EXAM: LEFT HIP - COMPLETE 2+ VIEW  COMPARISON:  None.  FINDINGS: The patient has a subcapital left hip fracture. No other acute bony or joint abnormality is identified.  IMPRESSION: Acute subcapital left hip fracture.   Electronically Signed   By: Drusilla Kanner M.D.   On: 09/28/2013 14:13   Dg Chest Port 1 View  09/28/2013   CLINICAL DATA:  Left hip fracture. Pre-op respiratory exam.  EXAM: PORTABLE CHEST - 1 VIEW  COMPARISON:  None.  FINDINGS: The heart size and mediastinal contours are within normal limits. Both lungs are clear. The visualized skeletal structures are unremarkable.  IMPRESSION: No active disease.   Electronically Signed   By: Myles Rosenthal M.D.   On: 09/28/2013 14:48    Review of Systems  Musculoskeletal: Positive for joint pain.  All other systems reviewed and are negative.   Blood pressure 176/72, pulse 79, temperature 98.5 F (36.9 Hampton), temperature source Oral, resp. rate 16, height 5' 1.5" (1.562 m), weight 43.999 kg (97 lb), SpO2 98.00%. Physical Exam  Constitutional: She is oriented to person, place, and time. She appears well-developed.  HENT:  Head: Normocephalic.  Eyes: Pupils are equal, round, and reactive to light.  Neck: Normal range of motion.  Cardiovascular: Normal rate.   Respiratory: Effort normal.  GI: Soft.  Musculoskeletal:  Left leg Er and short. Neuro intact. Pelvis stable. Pulses intact  Neurological: She is alert and oriented to person, place, and time.  Skin: Skin is warm and dry.  Psychiatric: She has a normal mood and affect.    Assessment/Plan: Left displaced femoral neck fracture. Plan left hip hemiarthroplasty. Risks discussed  Julia Hampton 09/28/2013, 5:06 PM

## 2013-09-28 NOTE — Transfer of Care (Signed)
Immediate Anesthesia Transfer of Care Note  Patient: Julia Hampton  Procedure(s) Performed: Procedure(s): ARTHROPLASTY BIPOLAR HIP (Left)  Patient Location: PACU  Anesthesia Type:General  Level of Consciousness: awake, alert , oriented and patient cooperative  Airway & Oxygen Therapy: Patient Spontanous Breathing and Patient connected to face mask oxygen  Post-op Assessment: Report given to PACU RN and Post -op Vital signs reviewed and stable  Post vital signs: stable  Complications: No apparent anesthesia complications

## 2013-09-28 NOTE — Anesthesia Preprocedure Evaluation (Signed)
Anesthesia Evaluation  Patient identified by MRN, date of birth, ID band Patient awake    Reviewed: Allergy & Precautions, H&P , NPO status , Patient's Chart, lab work & pertinent test results  Airway Mallampati: II TM Distance: >3 FB Neck ROM: Limited    Dental no notable dental hx.    Pulmonary neg pulmonary ROS,  breath sounds clear to auscultation  Pulmonary exam normal       Cardiovascular negative cardio ROS  Rhythm:Regular Rate:Tachycardia     Neuro/Psych negative neurological ROS  negative psych ROS   GI/Hepatic negative GI ROS, Neg liver ROS,   Endo/Other  negative endocrine ROS  Renal/GU negative Renal ROS  negative genitourinary   Musculoskeletal negative musculoskeletal ROS (+)   Abdominal   Peds negative pediatric ROS (+)  Hematology negative hematology ROS (+)   Anesthesia Other Findings   Reproductive/Obstetrics negative OB ROS                           Anesthesia Physical Anesthesia Plan  ASA: II  Anesthesia Plan: General   Post-op Pain Management:    Induction: Intravenous  Airway Management Planned: Oral ETT  Additional Equipment:   Intra-op Plan:   Post-operative Plan: Extubation in OR  Informed Consent: I have reviewed the patients History and Physical, chart, labs and discussed the procedure including the risks, benefits and alternatives for the proposed anesthesia with the patient or authorized representative who has indicated his/her understanding and acceptance.   Dental advisory given  Plan Discussed with: CRNA and Surgeon  Anesthesia Plan Comments:         Anesthesia Quick Evaluation

## 2013-09-28 NOTE — ED Notes (Signed)
Report received from Chyrel Masson RN.

## 2013-09-29 ENCOUNTER — Encounter (HOSPITAL_COMMUNITY): Payer: Self-pay | Admitting: Specialist

## 2013-09-29 DIAGNOSIS — I10 Essential (primary) hypertension: Secondary | ICD-10-CM

## 2013-09-29 LAB — BASIC METABOLIC PANEL
BUN: 17 mg/dL (ref 6–23)
Calcium: 8.3 mg/dL — ABNORMAL LOW (ref 8.4–10.5)
Chloride: 105 mEq/L (ref 96–112)
Creatinine, Ser: 0.62 mg/dL (ref 0.50–1.10)
GFR calc Af Amer: >90 mL/min (ref >90)
GFR calc non Af Amer: 82 mL/min — ABNORMAL LOW (ref >90)
Glucose, Bld: 145 mg/dL — ABNORMAL HIGH (ref 70–99)

## 2013-09-29 LAB — CBC
HCT: 33.9 % — ABNORMAL LOW (ref 36.0–46.0)
Hemoglobin: 11.1 g/dL — ABNORMAL LOW (ref 12.0–15.0)
MCH: 29.8 pg (ref 26.0–34.0)
MCHC: 32.7 g/dL (ref 30.0–36.0)
MCV: 91.1 fL (ref 78.0–100.0)
RBC: 3.72 MIL/uL — ABNORMAL LOW (ref 3.87–5.11)
RDW: 13.2 % (ref 11.5–15.5)

## 2013-09-29 LAB — VITAMIN D 25 HYDROXY (VIT D DEFICIENCY, FRACTURES): Vit D, 25-Hydroxy: 32 ng/mL (ref 30–89)

## 2013-09-29 LAB — URINE CULTURE: Colony Count: 30000

## 2013-09-29 MED ORDER — LISINOPRIL 5 MG PO TABS
5.0000 mg | ORAL_TABLET | Freq: Every day | ORAL | Status: DC
  Administered 2013-09-29 – 2013-10-02 (×4): 5 mg via ORAL
  Filled 2013-09-29 (×4): qty 1

## 2013-09-29 NOTE — Clinical Social Work Psychosocial (Signed)
     Clinical Social Work Department BRIEF PSYCHOSOCIAL ASSESSMENT 09/29/2013  Patient:  Julia Hampton, Julia Hampton     Account Number:  [id number]     Admit date:  09/28/2013  Clinical Social Worker:  Hattie Perch  Date/Time:  09/29/2013 12:00 M  Referred by:  Physician  Date Referred:  09/29/2013 Referred for  SNF Placement   Other Referral:   Interview type:  Patient Other interview type:    PSYCHOSOCIAL DATA Living Status:  ALONE Admitted from facility:   Level of care:   Primary support name:  Maree Krabbe Primary support relationship to patient:  CHILD, ADULT Degree of support available:   excellent    CURRENT CONCERNS Current Concerns  Post-Acute Placement   Other Concerns:    SOCIAL WORK ASSESSMENT / PLAN CSW met with patient. patient is alert and oriented X3. patient in need of snf replacement after hip repair surgery. Patient reports she fell while walking her dog and that she lives home alone. She states that she would like to go to [facility] when she is ready as she has friends who have previously been there.   Assessment/plan status:   Other assessment/ plan:   Information/referral to community resources:    PATIENTS/FAMILYS RESPONSE TO PLAN OF CARE: patient is pleasant and cooperative. Patient sitting up in chair. hopeful that she wont need to be at snf for long.

## 2013-09-29 NOTE — Progress Notes (Signed)
INITIAL NUTRITION ASSESSMENT  DOCUMENTATION CODES Per approved criteria  -Underweight   INTERVENTION: Provide [employer] Breakfast once daily Provide a snack once daily Encourage PO intake  NUTRITION DIAGNOSIS: Inadequate oral intake related to poor appetite as evidenced by pt's report and underweight BMI.   Goal: Pt to meet >/= 90% of their estimated nutrition needs   Monitor:  PO intake Weight trends Labs  Reason for Assessment: Consult  79 y.o. female  Admitting Dx: Hip fracture  ASSESSMENT: 79 yo female who presented to [facility] ED after an episode of fall at home, she fell on her left hip and has not been able to bear weight and walk.   Pt reports that she usually weighs 91 to 93 lbs and she usually eats crackers for breakfast, no lunch, and a small dinner. Pt states she never as much of an appetite but, reports her appetite is even worse today. Pt states that she drinks a lot of milk. Encouraged pt to drink [employer] to increase calories and protein in her diet; pt willing to try it while admitted.   Height: Ht Readings from Last 1 Encounters:  09/28/13 5' 1.5" (1.562 m)    Weight: Wt Readings from Last 1 Encounters:  09/28/13 97 lb (43.999 kg)    Ideal Body Weight: 108 lbs  % Ideal Body Weight: 90%  Wt Readings from Last 10 Encounters:  09/28/13 97 lb (43.999 kg)  09/28/13 97 lb (43.999 kg)    Usual Body Weight: 93 lbs  % Usual Body Weight: 104%  BMI:  Body mass index is 18.03 kg/(m^2). (Underweight)  Estimated Nutritional Needs: Kcal: 1370-1560 Protein: 50-60 grams Fluid: 1.3-1.5 L/day  Skin: left hip incision  Diet Order: General  EDUCATION NEEDS: -No education needs identified at this time   Intake/Output Summary (Last 24 hours) at 09/29/13 1256 Last data filed at 09/29/13 1100  Gross per 24 hour  Intake   1300 ml  Output   1330 ml  Net    -30 ml    Last BM: 12/11  Labs:   Recent Labs Lab 09/28/13 1456  09/29/13 0452  NA 143 137  K 3.4* 4.3  CL 107 105  CO2 24 23  BUN 20 17  CREATININE 0.66 0.62  CALCIUM 9.5 8.3*  GLUCOSE 95 145*    CBG (last 3)  No results found for this basename: GLUCAP,  in the last 72 hours  Scheduled Meds: . brinzolamide  1 drop Both Eyes BID  . enoxaparin (LOVENOX) injection  40 mg Subcutaneous Q24H  . lisinopril  5 mg Oral Daily    Continuous Infusions:   Past Medical History  Diagnosis Date  . Glaucoma   . Complication of anesthesia     trouble waking patient up    Past Surgical History  Procedure Laterality Date  . Cholecystectomy    . Abdominal hysterectomy    . Mastoidectomy      Ian Malkin RD, LDN Inpatient Clinical Dietitian Pager: [phone] After Hours Pager: [phone]

## 2013-09-29 NOTE — Evaluation (Signed)
Physical Therapy Evaluation Patient Details Name: Julia Hampton MRN: [id number] DOB: [birth year] Today's Date: 09/29/2013 Time: 4098-1191 PT Time Calculation (min): 30 min  PT Assessment / Plan / Recommendation History of Present Illness     Clinical Impression  Pt s/p L hip fx and hemiarthroplasty presents with decreased L LE strength/ROM, posterior THP and post op pain limiting functional mobility.  Pt would benefit from follow up rehab at SNF level prior to return home with limited assist    PT Assessment  Patient needs continued PT services    Follow Up Recommendations  SNF    Does the patient have the potential to tolerate intense rehabilitation      Barriers to Discharge        Equipment Recommendations  Rolling walker with 5" wheels    Recommendations for Other Services OT consult   Frequency 7X/week    Precautions / Restrictions Precautions Precautions: Posterior Hip;Fall Restrictions Weight Bearing Restrictions: No Other Position/Activity Restrictions: WBAT   Pertinent Vitals/Pain 3/10; premed, ice pack provided      Mobility  Bed Mobility Bed Mobility: Supine to Sit Supine to Sit: 4: Min assist;3: Mod assist Details for Bed Mobility Assistance: cues for sequence, use of R LE to self assist and adherence to THP Transfers Transfers: Sit to Stand;Stand to Sit Sit to Stand: 4: Min assist;3: Mod assist Stand to Sit: 4: Min assist Details for Transfer Assistance: cues for LE management and use of UEs to self assist Ambulation/Gait Ambulation/Gait Assistance: 4: Min assist;3: Mod assist Ambulation Distance (Feet): 45 Feet Assistive device: Rolling walker Ambulation/Gait Assistance Details: cues for sequence, posture, increased ER on L, and position from RW Gait Pattern: Step-to pattern;Decreased step length - right;Decreased step length - left;Shuffle;Antalgic    Exercises Total Joint Exercises Ankle Circles/Pumps: AROM;15 reps;Supine;Both Quad Sets:  AROM;Both;10 reps;Supine Heel Slides: AAROM;15 reps;Supine;Left Hip ABduction/ADduction: AAROM;Left;10 reps;Supine   PT Diagnosis: Difficulty walking  PT Problem List: Decreased strength;Decreased range of motion;Decreased activity tolerance;Decreased mobility;Decreased knowledge of use of DME;Pain PT Treatment Interventions: DME instruction;Gait training;Stair training;Functional mobility training;Therapeutic activities;Therapeutic exercise;Patient/family education     PT Goals(Current goals can be found in the care plan section) Acute Rehab PT Goals Patient Stated Goal: Rehab and home to resume active lifestyle PT Goal Formulation: With patient Time For Goal Achievement: 10/05/13 Potential to Achieve Goals: Good  Visit Information  Last PT Received On: 09/29/13 Assistance Needed: +1       Prior Functioning  Home Living Family/patient expects to be discharged to:: Skilled nursing facility Living Arrangements: Alone Prior Function Level of Independence: Independent Communication Communication: No difficulties Dominant Hand: Right    Cognition  Cognition Arousal/Alertness: Awake/alert Behavior During Therapy: WFL for tasks assessed/performed Overall Cognitive Status: Within Functional Limits for tasks assessed    Extremity/Trunk Assessment Upper Extremity Assessment Upper Extremity Assessment: Overall WFL for tasks assessed Lower Extremity Assessment Lower Extremity Assessment: LLE deficits/detail LLE Deficits / Details: hip strength 2+/5 with AAROM at hip to 85 flex and 15 abd   Balance    End of Session PT - End of Session Equipment Utilized During Treatment: Gait belt Activity Tolerance: Patient tolerated treatment well Patient left: in chair;with call bell/phone within reach;with family/visitor present Nurse Communication: Mobility status  GP     Shavontae Gibeault 09/29/2013, 1:06 PM

## 2013-09-29 NOTE — Progress Notes (Signed)
Utilization review completed.  

## 2013-09-29 NOTE — Progress Notes (Signed)
Physical Therapy Treatment Patient Details Name: Julia Hampton MRN: [id number] DOB: 1930-01-31 Today's Date: 09/29/2013 Time: 5621-3086 PT Time Calculation (min): 16 min  PT Assessment / Plan / Recommendation  History of Present Illness     PT Comments     Follow Up Recommendations  SNF     Does the patient have the potential to tolerate intense rehabilitation     Barriers to Discharge        Equipment Recommendations  Rolling walker with 5" wheels    Recommendations for Other Services OT consult  Frequency 7X/week   Progress towards PT Goals Progress towards PT goals: Progressing toward goals  Plan Current plan remains appropriate    Precautions / Restrictions Precautions Precautions: Posterior Hip;Fall Precaution Comments: Pt recalls 2/3 THP Restrictions Weight Bearing Restrictions: No Other Position/Activity Restrictions: WBAT   Pertinent Vitals/Pain 3/10; premed    Mobility  Transfers Transfers: Sit to Stand;Stand to Sit Sit to Stand: 4: Min assist;3: Mod assist Stand to Sit: 4: Min assist Details for Transfer Assistance: cues for LE management and use of UEs to self assist Ambulation/Gait Ambulation/Gait Assistance: 4: Min assist;3: Mod assist Ambulation Distance (Feet): 68 Feet Assistive device: Rolling walker Ambulation/Gait Assistance Details: cues for sequence, posture, position from RW and ER on L Gait Pattern: Step-to pattern;Decreased step length - right;Decreased step length - left;Shuffle;Antalgic Stairs: No    Exercises     PT Diagnosis:    PT Problem List:   PT Treatment Interventions:     PT Goals (current goals can now be found in the care plan section) Acute Rehab PT Goals Patient Stated Goal: Rehab and home to resume active lifestyle PT Goal Formulation: With patient Time For Goal Achievement: 10/05/13 Potential to Achieve Goals: Good  Visit Information  Last PT Received On: 09/29/13 Assistance Needed: +1    Subjective Data  Patient Stated Goal: Rehab and home to resume active lifestyle   Cognition  Cognition Arousal/Alertness: Awake/alert Behavior During Therapy: WFL for tasks assessed/performed Overall Cognitive Status: Within Functional Limits for tasks assessed    Balance     End of Session PT - End of Session Equipment Utilized During Treatment: Gait belt Activity Tolerance: Patient tolerated treatment well Patient left: Other (comment) (sitting at bedside with OT) Nurse Communication: Mobility status   GP     Julia Hampton 09/29/2013, 3:37 PM

## 2013-09-29 NOTE — Evaluation (Signed)
Occupational Therapy Evaluation Patient Details Name: Julia Hampton MRN: [id number] DOB: September 24, 1930 Today's Date: 09/29/2013 Time: 3086-5784 OT Time Calculation (min): 16 min  OT Assessment / Plan / Recommendation History of present illness admitted for L THA posterior approach   Clinical Impression   Pt was admitted for the above surgery. She will benefit from skilled OT to increase safety and independence with adls, following posterior thps.  Pt was independent with adls prior to admission and goals in acute are for supervision level    OT Assessment  Patient needs continued OT Services    Follow Up Recommendations  SNF    Barriers to Discharge      Equipment Recommendations  3 in 1 bedside comode    Recommendations for Other Services    Frequency  Min 2X/week    Precautions / Restrictions Precautions Precautions: Posterior Hip;Fall Precaution Comments: Pt recalls 2/3 THP Restrictions Weight Bearing Restrictions: No Other Position/Activity Restrictions: WBAT   Pertinent Vitals/Pain No c/o pain.   Declined ice, repositioned in bed    ADL  Grooming: Set up Where Assessed - Grooming: Unsupported sitting Upper Body Bathing: Set up Where Assessed - Upper Body Bathing: Unsupported sitting Lower Body Bathing: Minimal assistance (with AE) Where Assessed - Lower Body Bathing: Supported sit to stand Upper Body Dressing: Set up Where Assessed - Upper Body Dressing: Unsupported sitting Lower Body Dressing: Moderate assistance (with AE) Where Assessed - Lower Body Dressing: Supported sit to Statistician and Hygiene: Minimal assistance Where Assessed - Engineer, mining and Hygiene: Sit to stand from 3-in-1 or toilet Equipment Used: Reacher;Rolling walker;Sock aid Transfers/Ambulation Related to ADLs: sit to stand only from eob with min A and bed mobiity to return to supine ADL Comments: Educated on AE and THPs.  Introduced AE and pt  used Sports administrator and sock aid:  will need reinforcement.  Pt has a strong internal rotation tendency:  blanket rolled between legs in supine    OT Diagnosis: Generalized weakness  OT Problem List: Decreased activity tolerance;Decreased strength;Decreased knowledge of use of DME or AE;Decreased knowledge of precautions;Pain OT Treatment Interventions: Self-care/ADL training;DME and/or AE instruction;Therapeutic activities;Patient/family education   OT Goals(Current goals can be found in the care plan section) Acute Rehab OT Goals Patient Stated Goal: rehab then home to dog and cat OT Goal Formulation: With patient Time For Goal Achievement: 10/06/13 Potential to Achieve Goals: Good ADL Goals Pt Will Perform Lower Body Bathing: with supervision;sit to/from stand;with adaptive equipment Pt Will Perform Lower Body Dressing: with supervision;with adaptive equipment;sit to/from stand Pt Will Transfer to Toilet: with supervision;bedside commode;ambulating Pt Will Perform Toileting - Clothing Manipulation and hygiene: with supervision;sit to/from stand Additional ADL Goal #1: pt will recall 3/3 thps and not need cues during adls for these  Visit Information  Last OT Received On: 09/29/13 Assistance Needed: +1 History of Present Illness: admitted for L THA posterior approach       Prior Functioning     Home Living Family/patient expects to be discharged to:: Skilled nursing facility Living Arrangements: Alone Prior Function Level of Independence: Independent Communication Communication: No difficulties Dominant Hand: Right         Vision/Perception     Cognition  Cognition Arousal/Alertness: Awake/alert Behavior During Therapy: WFL for tasks assessed/performed Overall Cognitive Status: Within Functional Limits for tasks assessed    Extremity/Trunk Assessment Upper Extremity Assessment Upper Extremity Assessment: Overall WFL for tasks assessed     Mobility Bed Mobility Sit  to Supine:  4: Min assist;HOB flat Details for Bed Mobility Assistance: assist for LLE Transfers Sit to Stand: 4: Min assist;From bed;From elevated surface Stand to Sit: 4: Min assist Details for Transfer Assistance: cues for UE and LE position     Exercise     Balance     End of Session OT - End of Session Activity Tolerance: Patient tolerated treatment well Patient left: with call bell/phone within reach;with family/visitor present  GO     Vivek Grealish 09/29/2013, 4:07 PM Marica Otter, OTR/L [phone] 09/29/2013

## 2013-09-29 NOTE — Op Note (Signed)
NAMESIGNE, TACKITT               ACCOUNT NO.:  [id number]  MEDICAL RECORD NO.:  [id number]  LOCATION:  1618                         FACILITY:  [facility]  PHYSICIAN:  Jene Every, M.D.    DATE OF BIRTH:  23-Apr-1930  DATE OF PROCEDURE:  09/28/2013 DATE OF DISCHARGE:                              OPERATIVE REPORT   PREOPERATIVE DIAGNOSIS:  Left femoral neck fracture.  POSTOPERATIVE DIAGNOSIS:  Left femoral neck fracture.  PROCEDURE PERFORMED:  Left hip hemiarthroplasty.  ANESTHESIA:  General.  ASSISTANT:  Lanna Poche, PA.  COMPLEMENTS:  10.5 DePuy AML, 42 bipolar +1.5 neck.  ANESTHESIA:  General.  HISTORY:  An 79 year old fell sustaining a femoral neck fracture indicated for replacement by hemiarthroplasty.  Risks and benefits discussed including bleeding, infection, DVT, PE, dislocation, etc.  DESCRIPTION OF PROCEDURE:  With the patient in supine position, after induction of adequate general anesthesia, 2 g Kefzol, she was placed in right lateral decubitus position.  All bony prominences well padded. The hip holder utilized, well leg flexed and padded, Foley to gravity. Left lower extremity was prepped and draped in usual sterile fashion. Posterolateral approach to the hip was performed.  A curvilinear incision was placed on the trochanter, subcutaneous tissue was dissected by cautery utilized to achieve hemostasis.  Fascia lata identified, divided in line with skin incision.  Self-retaining retractors were placed.  Adductor tenotomy performed, external rotators identified, tagged, and reflected posteriorly.  Capsule was identified, T-shaped capsulotomy performed.  Fracture hematoma expressed.  The hip was dislocated.  The fracture noted.  Oscillating saw performed, osteotomy 1 fingerbreadth above the lesser trochanter.  I then initiated on box chisel to enter the femoral canal and then with the T-handle reamer after copiously irrigating it.  This was then reamed to a  10.5 diameter with good cortical contact.  I broached with a 10.5 broach, excellent purchase was noted, we lateralized this as well.  Attention was turned towards the acetabulum.  We removed the head.  It was measured at 42, inspected the acetabulum, good cartilage.  It was debrided and cauterized.  Copiously irrigated.  I placed the permanent 10.5 prosthesis in the appropriate anteversion, impacted in place with excellent purchase.  Cleaned the trunnion and impacted the 42 bipolar assembly upon it, reduced the hip, and it was found to be stable throughout a full range and good leg lengths.  Wound copiously irrigated.  We repaired the adductor tenotomy with 1 Vicryl.  The fascia lata with 1 Vicryl as well as Ethibond subcu with 2-0 skin with staples. Wound was dressed sterilely.  Placed supine on the hospital bed, knee immobilizer placed, good leg lengths.  Good pulses.  We had repaired the capsule as well with 1 Ethibond interrupted figure-of-eight sutures. The sciatic nerve was protected throughout the case.  The patient was transported to the recovery room in satisfactory condition.  Tolerated the procedure well.  Minimal blood loss.     Jene Every, M.D.     Cordelia Pen  D:  09/28/2013  T:  09/29/2013  Job:  [id number]

## 2013-09-29 NOTE — Progress Notes (Signed)
Subjective: 1 Day Post-Op Procedure(s) (LRB): ARTHROPLASTY BIPOLAR HIP (Left) Patient reports pain as mild.  Seen in AM rounds for Dr. Shelle Iron. Reports she is doing well, mild pain left lateral hip, incision site, no radiating pain. Denies numbness or tingling. No other c/o this AM.  Objective: Vital signs in last 24 hours: Temp:  [97.3 F (36.3 C)-98.5 F (36.9 C)] 97.9 F (36.6 C) (12/12 0537) Pulse Rate:  [75-92] 76 (12/12 0537) Resp:  [14-18] 15 (12/12 0537) BP: (127-207)/(71-85) 156/71 mmHg (12/12 0537) SpO2:  [96 %-100 %] 97 % (12/12 0537) Weight:  [43.999 kg (97 lb)] 43.999 kg (97 lb) (12/11 1625)  Intake/Output from previous day: 12/11 0701 - 12/12 0700 In: 1300 [P.O.:300; I.V.:950; IV Piggyback:50] Out: 880 [Urine:805; Blood:75] Intake/Output this shift:     Recent Labs  09/28/13 1456 09/29/13 0452  HGB 13.3 11.1*    Recent Labs  09/28/13 1456 09/29/13 0452  WBC 12.3* 6.8  RBC 4.41 3.72*  HCT 40.0 33.9*  PLT 228 188    Recent Labs  09/28/13 1456 09/29/13 0452  NA 143 137  K 3.4* 4.3  CL 107 105  CO2 24 23  BUN 20 17  CREATININE 0.66 0.62  GLUCOSE 95 145*  CALCIUM 9.5 8.3*    Recent Labs  09/28/13 1456  INR 0.89    Neurologically intact ABD soft Neurovascular intact Sensation intact distally Intact pulses distally Dorsiflexion/Plantar flexion intact Incision: dressing C/D/I and no drainage No cellulitis present Compartment soft no calf pain or sign of DVT  Assessment/Plan: 1 Day Post-Op Procedure(s) (LRB): ARTHROPLASTY BIPOLAR HIP (Left) Advance diet Up with therapy D/C IV fluids WBAT, posterior hip precautions, immobilizer while in bed Lovenox for DVT ppx Will discuss with Dr. Elissa Lovett, Julia M. 09/29/2013, 7:52 AM

## 2013-09-29 NOTE — Progress Notes (Signed)
[facility] PROGRESS NOTE  SHANEQUIA KENDRICK ZOX:[id number] DOB: [birth year] DOA: 09/28/2013 PCP: No primary provider on file.  Assessment/Plan: Left subcapital hip fracture -Status post left bipolar hip arthroplasty -Appreciate orthopedics -Continue physical therapy -Pain control -DVT prophylaxis per orthopedic recommendations Hypertension -Elevated blood pressure partly due to pain -However blood pressure remains elevated and pain is controlled at this time -Start lisinopril and monitor electrolytes Asymptomatic bacteriuria -No pyuria on UA -Discontinue antibiotics Glaucoma -Continue home eye drops    Family Communication:   Updated daughter at bedside Disposition Plan:   Skilled nursing facility on 10/02/2013   Procedures:  L-hip bipolar arthroplasty  09/28/13  Antibiotics:  Ceftriaxone 09/28/2013    Procedures/Studies: Dg Hip Complete Left  09/28/2013   CLINICAL DATA:  Status post fall.  Left hip pain.  EXAM: LEFT HIP - COMPLETE 2+ VIEW  COMPARISON:  None.  FINDINGS: The patient has a subcapital left hip fracture. No other acute bony or joint abnormality is identified.  IMPRESSION: Acute subcapital left hip fracture.   Electronically Signed   By: Drusilla Kanner M.D.   On: 09/28/2013 14:13   Dg Pelvis Portable  09/28/2013   CLINICAL DATA:  Postop left hip arthroplasty  EXAM: PORTABLE PELVIS 1-2 VIEWS  COMPARISON:  09/28/2013 at 1356 hr  FINDINGS: There is a new left hip prosthesis as well seated and aligned. There is no new fracture or evidence of an operative complication.  IMPRESSION: New left hip prosthesis is well-seated and aligned.   Electronically Signed   By: Amie Portland M.D.   On: 09/28/2013 21:01   Dg Chest Port 1 View  09/28/2013   CLINICAL DATA:  Left hip fracture. Pre-op respiratory exam.  EXAM: PORTABLE CHEST - 1 VIEW  COMPARISON:  None.  FINDINGS: The heart size and mediastinal contours are within normal limits. Both lungs are clear. The  visualized skeletal structures are unremarkable.  IMPRESSION: No active disease.   Electronically Signed   By: Myles Rosenthal M.D.   On: 09/28/2013 14:48   Dg Hip Portable 1 View Left  09/28/2013   CLINICAL DATA:  Postop total hip replacement.  EXAM: PORTABLE LEFT HIP - 1 VIEW  COMPARISON:  09/28/2013  FINDINGS: Status post left hip arthroplasty. Surgical clips overlie the hip. There is postoperative gas following surgery. On the frontal view, there is no evidence for dislocation. No new fractures are identified  IMPRESSION: Status post left hip arthroplasty.  No adverse features identified.   Electronically Signed   By: Rosalie Gums M.D.   On: 09/28/2013 23:18         Subjective: Patient is doing well. Pain is controlled. Denies any fevers, chills, chest discomfort, shortness of breath, nausea, vomiting, abdominal pain, diarrhea. Denies dysuria.  Objective: Filed Vitals:   09/29/13 0800 09/29/13 1158 09/29/13 1423 09/29/13 1517  BP:   146/77   Pulse:   92   Temp:   98.9 F (37.2 C)   TempSrc:   Oral   Resp: 16 18 18 14   Height:      Weight:      SpO2:   96%     Intake/Output Summary (Last 24 hours) at 09/29/13 1717 Last data filed at 09/29/13 1400  Gross per 24 hour  Intake   1710 ml  Output   1255 ml  Net    455 ml   Weight change:  Exam:   General:  Pt is alert, follows commands appropriately, not in acute distress  HEENT: No icterus,  No thrush, No neck mass, [place]/AT  Cardiovascular: RRR, S1/S2, no rubs, no gallops  Respiratory: CTA bilaterally, no wheezing, no crackles, no rhonchi  Abdomen: Soft/+BS, non tender, non distended, no guarding  Extremities: No edema, No lymphangitis, No petechiae, No rashes, no synovitis  Data Reviewed: Basic Metabolic Panel:  Recent Labs Lab 09/28/13 1456 09/29/13 0452  NA 143 137  K 3.4* 4.3  CL 107 105  CO2 24 23  GLUCOSE 95 145*  BUN 20 17  CREATININE 0.66 0.62  CALCIUM 9.5 8.3*   Liver Function Tests: No results  found for this basename: AST, ALT, ALKPHOS, BILITOT, PROT, ALBUMIN,  in the last 168 hours No results found for this basename: LIPASE, AMYLASE,  in the last 168 hours No results found for this basename: AMMONIA,  in the last 168 hours CBC:  Recent Labs Lab 09/28/13 1456 09/29/13 0452  WBC 12.3* 6.8  NEUTROABS 10.7*  --   HGB 13.3 11.1*  HCT 40.0 33.9*  MCV 90.7 91.1  PLT 228 188   Cardiac Enzymes: No results found for this basename: CKTOTAL, CKMB, CKMBINDEX, TROPONINI,  in the last 168 hours BNP: No components found with this basename: POCBNP,  CBG: No results found for this basename: GLUCAP,  in the last 168 hours  Recent Results (from the past 240 hour(s))  SURGICAL PCR SCREEN     Status: None   Collection Time    09/28/13  5:40 PM      Result Value Range Status   MRSA, PCR NEGATIVE  NEGATIVE Final   Staphylococcus aureus NEGATIVE  NEGATIVE Final   Comment:            The Xpert SA Assay (FDA     approved for NASAL specimens     in patients over 6 years of age),     is one component of     a comprehensive surveillance     program.  Test performance has     been validated by [employer] for patients greater     than or equal to 81 year old.     It is not intended     to diagnose infection nor to     guide or monitor treatment.     Scheduled Meds: . brinzolamide  1 drop Both Eyes BID  . enoxaparin (LOVENOX) injection  40 mg Subcutaneous Q24H  . lisinopril  5 mg Oral Daily   Continuous Infusions:    Ivah Girardot, DO  [facility] Pager [phone]  If 7PM-7AM, please contact night-coverage [email or web address] Password [email or web address] 09/29/2013, 5:17 PM   LOS: 1 day

## 2013-09-30 DIAGNOSIS — K5900 Constipation, unspecified: Secondary | ICD-10-CM

## 2013-09-30 LAB — CBC
HCT: 36 % (ref 36.0–46.0)
Hemoglobin: 11.7 g/dL — ABNORMAL LOW (ref 12.0–15.0)
MCH: 30 pg (ref 26.0–34.0)
MCV: 92.3 fL (ref 78.0–100.0)
Platelets: 210 10*3/uL (ref 150–400)
RBC: 3.9 MIL/uL (ref 3.87–5.11)
WBC: 11.8 10*3/uL — ABNORMAL HIGH (ref 4.0–10.5)

## 2013-09-30 LAB — BASIC METABOLIC PANEL
BUN: 16 mg/dL (ref 6–23)
CO2: 20 mEq/L (ref 19–32)
Chloride: 105 mEq/L (ref 96–112)
Creatinine, Ser: 0.58 mg/dL (ref 0.50–1.10)
GFR calc Af Amer: >90 mL/min (ref >90)
Glucose, Bld: 117 mg/dL — ABNORMAL HIGH (ref 70–99)

## 2013-09-30 MED ORDER — DOCUSATE SODIUM 100 MG PO CAPS
100.0000 mg | ORAL_CAPSULE | Freq: Two times a day (BID) | ORAL | Status: DC
  Administered 2013-09-30: 21:00:00 100 mg via ORAL
  Filled 2013-09-30 (×3): qty 1

## 2013-09-30 MED ORDER — SENNA 8.6 MG PO TABS
2.0000 | ORAL_TABLET | Freq: Every day | ORAL | Status: DC
  Filled 2013-09-30: qty 2

## 2013-09-30 NOTE — Progress Notes (Signed)
Physical Therapy Treatment Patient Details Name: Julia Hampton MRN: [id number] DOB: 12-09-1929 Today's Date: 09/30/2013 Time: 1227-1250 PT Time Calculation (min): 23 min  PT Assessment / Plan / Recommendation  History of Present Illness admitted for L THA posterior approach   PT Comments   Pt extremely motivated and progressing well.  Follow Up Recommendations  SNF     Does the patient have the potential to tolerate intense rehabilitation     Barriers to Discharge        Equipment Recommendations  Rolling walker with 5" wheels    Recommendations for Other Services OT consult  Frequency 7X/week   Progress towards PT Goals Progress towards PT goals: Progressing toward goals  Plan Current plan remains appropriate    Precautions / Restrictions Precautions Precautions: Posterior Hip;Fall Precaution Comments: Pt recalls 2/3 THP Restrictions Weight Bearing Restrictions: No Other Position/Activity Restrictions: WBAT   Pertinent Vitals/Pain 3/10; premed, ice pack offered but declined    Mobility  Bed Mobility Bed Mobility: Supine to Sit Supine to Sit: 4: Min assist Details for Bed Mobility Assistance: assist for LLE Transfers Transfers: Sit to Stand;Stand to Sit Sit to Stand: 4: Min assist;From bed;From elevated surface;From chair/3-in-1 Stand to Sit: 4: Min assist;To chair/3-in-1;With armrests;With upper extremity assist Details for Transfer Assistance: cues for UE and LE position Ambulation/Gait Ambulation/Gait Assistance: 4: Min assist Ambulation Distance (Feet): 100 Feet (and 20') Assistive device: Rolling walker Ambulation/Gait Assistance Details: cues for sequence, posture, position from RW, stride length, and ER on L Gait Pattern: Step-to pattern;Decreased step length - right;Decreased step length - left;Shuffle;Antalgic Stairs: No    Exercises     PT Diagnosis:    PT Problem List:   PT Treatment Interventions:     PT Goals (current goals can now be found  in the care plan section) Acute Rehab PT Goals Patient Stated Goal: rehab then home to dog and cat PT Goal Formulation: With patient Time For Goal Achievement: 10/05/13 Potential to Achieve Goals: Good  Visit Information  Last PT Received On: 09/30/13 Assistance Needed: +1 History of Present Illness: admitted for L THA posterior approach    Subjective Data  Patient Stated Goal: rehab then home to dog and cat   Cognition  Cognition Arousal/Alertness: Awake/alert Behavior During Therapy: WFL for tasks assessed/performed Overall Cognitive Status: Within Functional Limits for tasks assessed    Balance     End of Session PT - End of Session Equipment Utilized During Treatment: Gait belt Activity Tolerance: Patient tolerated treatment well Patient left: in chair;with call bell/phone within reach Nurse Communication: Mobility status   GP     Masson Nalepa 09/30/2013, 1:36 PM

## 2013-09-30 NOTE — Progress Notes (Signed)
[facility] PROGRESS NOTE  Julia Hampton ZOX:[id number] DOB: Feb 01, 1930 DOA: 09/28/2013 PCP: No primary provider on file.  Assessment/Plan: Left subcapital hip fracture  -Status post left bipolar hip arthroplasty  -Appreciate orthopedics  -Continue physical therapy  -Pain control  -DVT prophylaxis per orthopedic recommendations  Hypertension  -Elevated blood pressure partly due to pain  -However blood pressure remains elevated and pain is controlled at this time  -continue lisinopril and monitor electrolytes  Asymptomatic bacteriuria  -No pyuria on UA  -Discontinue antibiotics  Glaucoma  -Continue home eye drops Constipation -start cathartics  Family Communication:   None today Disposition Plan:   SNF       Procedures/Studies: Dg Hip Complete Left  09/28/2013   CLINICAL DATA:  Status post fall.  Left hip pain.  EXAM: LEFT HIP - COMPLETE 2+ VIEW  COMPARISON:  None.  FINDINGS: The patient has a subcapital left hip fracture. No other acute bony or joint abnormality is identified.  IMPRESSION: Acute subcapital left hip fracture.   Electronically Signed   By: Julia Hampton M.D.   On: 09/28/2013 14:13   Dg Pelvis Portable  09/28/2013   CLINICAL DATA:  Postop left hip arthroplasty  EXAM: PORTABLE PELVIS 1-2 VIEWS  COMPARISON:  09/28/2013 at 1356 hr  FINDINGS: There is a new left hip prosthesis as well seated and aligned. There is no new fracture or evidence of an operative complication.  IMPRESSION: New left hip prosthesis is well-seated and aligned.   Electronically Signed   By: Julia Hampton M.D.   On: 09/28/2013 21:01   Dg Chest Port 1 View  09/28/2013   CLINICAL DATA:  Left hip fracture. Pre-op respiratory exam.  EXAM: PORTABLE CHEST - 1 VIEW  COMPARISON:  None.  FINDINGS: The heart size and mediastinal contours are within normal limits. Both lungs are clear. The visualized skeletal structures are unremarkable.  IMPRESSION: No active disease.   Electronically Signed    By: Julia Hampton M.D.   On: 09/28/2013 14:48   Dg Hip Portable 1 View Left  09/28/2013   CLINICAL DATA:  Postop total hip replacement.  EXAM: PORTABLE LEFT HIP - 1 VIEW  COMPARISON:  09/28/2013  FINDINGS: Status post left hip arthroplasty. Surgical clips overlie the hip. There is postoperative gas following surgery. On the frontal view, there is no evidence for dislocation. No new fractures are identified  IMPRESSION: Status post left hip arthroplasty.  No adverse features identified.   Electronically Signed   By: Julia Hampton M.D.   On: 09/28/2013 23:18         Subjective: Patient states that her pain is controlled. Denies fevers, chills, chest discomfort, shortness breath, nausea, vomiting, diarrhea, abdominal pain, dysuria.  Objective: Filed Vitals:   09/30/13 0500 09/30/13 0800 09/30/13 1200 09/30/13 1432  BP: 142/73   131/73  Pulse: 100   83  Temp: 99 F (37.2 C)   98.7 F (37.1 C)  TempSrc: Oral   Oral  Resp: 16 16 16 16   Height:      Weight:      SpO2: 96% 96% 96% 98%    Intake/Output Summary (Last 24 hours) at 09/30/13 1543 Last data filed at 09/30/13 1519  Gross per 24 hour  Intake    360 ml  Output   1000 ml  Net   -640 ml   Weight change:  Exam:   General:  Pt is alert, follows commands appropriately, not in acute distress  HEENT: No icterus, No thrush,  [place]/AT  Cardiovascular: RRR, S1/S2, no rubs, no gallops  Respiratory: Diminished breath sounds at the bases. No wheezing. Good air movement.  Abdomen: Soft/+BS, non tender, non distended, no guarding  Extremities: trace LE edema, No lymphangitis, No petechiae, No rashes, no synovitis  Data Reviewed: Basic Metabolic Panel:  Recent Labs Lab 09/28/13 1456 09/29/13 0452 09/30/13 0414  NA 143 137 136  K 3.4* 4.3 3.9  CL 107 105 105  CO2 24 23 20   GLUCOSE 95 145* 117*  BUN 20 17 16   CREATININE 0.66 0.62 0.58  CALCIUM 9.5 8.3* 8.9   Liver Function Tests: No results found for this basename: AST,  ALT, ALKPHOS, BILITOT, PROT, ALBUMIN,  in the last 168 hours No results found for this basename: LIPASE, AMYLASE,  in the last 168 hours No results found for this basename: AMMONIA,  in the last 168 hours CBC:  Recent Labs Lab 09/28/13 1456 09/29/13 0452 09/30/13 0414  WBC 12.3* 6.8 11.8*  NEUTROABS 10.7*  --   --   HGB 13.3 11.1* 11.7*  HCT 40.0 33.9* 36.0  MCV 90.7 91.1 92.3  PLT 228 188 210   Cardiac Enzymes: No results found for this basename: CKTOTAL, CKMB, CKMBINDEX, TROPONINI,  in the last 168 hours BNP: No components found with this basename: POCBNP,  CBG: No results found for this basename: GLUCAP,  in the last 168 hours  Recent Results (from the past 240 hour(s))  URINE CULTURE     Status: None   Collection Time    09/28/13  3:39 PM      Result Value Range Status   Specimen Description URINE, RANDOM   Final   Special Requests NONE   Final   Culture  Setup Time     Final   Value: 09/28/2013 21:55     Performed at [facility] Count     Final   Value: 30,000 COLONIES/ML     Performed at [facility]   Culture     Final   Value: Multiple bacterial morphotypes present, none predominant. Suggest appropriate recollection if clinically indicated.     Performed at [facility]   Report Status 09/29/2013 FINAL   Final  SURGICAL PCR SCREEN     Status: None   Collection Time    09/28/13  5:40 PM      Result Value Range Status   MRSA, PCR NEGATIVE  NEGATIVE Final   Staphylococcus aureus NEGATIVE  NEGATIVE Final   Comment:            The Xpert SA Assay (FDA     approved for NASAL specimens     in patients over 67 years of age),     is one component of     a comprehensive surveillance     program.  Test performance has     been validated by [employer] [facility] for patients greater     than or equal to 90 year old.     It is not intended     to diagnose infection nor to     guide or monitor treatment.     Scheduled Meds: .  brinzolamide  1 drop Both Eyes BID  . docusate sodium  100 mg Oral BID  . enoxaparin (LOVENOX) injection  40 mg Subcutaneous Q24H  . lisinopril  5 mg Oral Daily  . senna  2 tablet Oral Daily   Continuous Infusions:    Julia Hampton,  Julia Kaplan, DO  [facility] Pager [phone]  If 7PM-7AM, please contact night-coverage [email or web address] Password [email or web address] 09/30/2013, 3:43 PM   LOS: 2 days

## 2013-09-30 NOTE — Plan of Care (Signed)
Problem: Phase II Progression Outcomes Goal: Discharge plan established Outcome: Progressing For SNF, plans to go to [facility]

## 2013-09-30 NOTE — Progress Notes (Signed)
Subjective: 2 Days Post-Op Procedure(s) (LRB): ARTHROPLASTY BIPOLAR HIP (Left) Patient reports pain as 3 on 0-10 scale.    Objective: Vital signs in last 24 hours: Temp:  [98.9 F (37.2 C)-99.4 F (37.4 C)] 99 F (37.2 C) (12/13 0500) Pulse Rate:  [64-100] 100 (12/13 0500) Resp:  [14-18] 16 (12/13 0500) BP: (131-162)/(73-83) 142/73 mmHg (12/13 0500) SpO2:  [96 %-98 %] 96 % (12/13 0500)  Intake/Output from previous day: 12/12 0701 - 12/13 0700 In: 410 [P.O.:360; IV Piggyback:50] Out: 1750 [Urine:1750] Intake/Output this shift:     Recent Labs  09/28/13 1456 09/29/13 0452 09/30/13 0414  HGB 13.3 11.1* 11.7*    Recent Labs  09/29/13 0452 09/30/13 0414  WBC 6.8 11.8*  RBC 3.72* 3.90  HCT 33.9* 36.0  PLT 188 210    Recent Labs  09/29/13 0452 09/30/13 0414  NA 137 136  K 4.3 3.9  CL 105 105  CO2 23 20  BUN 17 16  CREATININE 0.62 0.58  GLUCOSE 145* 117*  CALCIUM 8.3* 8.9    Recent Labs  09/28/13 1456  INR 0.89    Neurologically intact  Assessment/Plan: 2 Days Post-Op Procedure(s) (LRB): ARTHROPLASTY BIPOLAR HIP (Left) Advance diet Up with therapy Discharge to SNF when avail  Armas Mcbee C 09/30/2013, 7:53 AM

## 2013-10-01 LAB — CBC
HCT: 34.8 % — ABNORMAL LOW (ref 36.0–46.0)
Hemoglobin: 11.3 g/dL — ABNORMAL LOW (ref 12.0–15.0)
MCH: 30.4 pg (ref 26.0–34.0)
MCV: 93.5 fL (ref 78.0–100.0)
Platelets: 190 10*3/uL (ref 150–400)
RDW: 13.3 % (ref 11.5–15.5)
WBC: 9.8 10*3/uL (ref 4.0–10.5)

## 2013-10-01 MED ORDER — DSS 100 MG PO CAPS: 100.0000 mg | ORAL_CAPSULE | Freq: Two times a day (BID) | ORAL | Status: DC | PRN

## 2013-10-01 MED ORDER — LATANOPROST 0.005 % OP SOLN
1.0000 [drp] | Freq: Every day | OPHTHALMIC | Status: DC
  Administered 2013-10-01: 1 [drp] via OPHTHALMIC
  Filled 2013-10-01: qty 2.5

## 2013-10-01 MED ORDER — LISINOPRIL 5 MG PO TABS: 5.0000 mg | ORAL_TABLET | Freq: Every day | ORAL | Status: DC

## 2013-10-01 NOTE — Progress Notes (Signed)
[facility] PROGRESS NOTE  JAMYIA FORTUNE EAV:[id number] DOB: 1930/02/09 DOA: 09/28/2013 PCP: No primary provider on file.  Assessment/Plan: Left subcapital hip fracture  -Status post left bipolar hip arthroplasty 09/28/13 -Appreciate orthopedics  -Continue physical therapy  -Pain control  -DVT prophylaxis per orthopedic recommendations  Hypertension  -Elevated blood pressure partly due to pain  -However blood pressure remains elevated and pain is controlled at this time  -continue lisinopril and monitor electrolytes  Asymptomatic bacteriuria  -No pyuria on UA  -Discontinue antibiotics  Glaucoma  -Continue home eye drops  Constipation  -start cathartics--->+BM Family Communication: Granddaughter at bedside Disposition Plan: SNF on 10/02/13         Procedures/Studies: Dg Hip Complete Left  09/28/2013   CLINICAL DATA:  Status post fall.  Left hip pain.  EXAM: LEFT HIP - COMPLETE 2+ VIEW  COMPARISON:  None.  FINDINGS: The patient has a subcapital left hip fracture. No other acute bony or joint abnormality is identified.  IMPRESSION: Acute subcapital left hip fracture.   Electronically Signed   By: Drusilla Kanner M.D.   On: 09/28/2013 14:13   Dg Pelvis Portable  09/28/2013   CLINICAL DATA:  Postop left hip arthroplasty  EXAM: PORTABLE PELVIS 1-2 VIEWS  COMPARISON:  09/28/2013 at 1356 hr  FINDINGS: There is a new left hip prosthesis as well seated and aligned. There is no new fracture or evidence of an operative complication.  IMPRESSION: New left hip prosthesis is well-seated and aligned.   Electronically Signed   By: Amie Portland M.D.   On: 09/28/2013 21:01   Dg Chest Port 1 View  09/28/2013   CLINICAL DATA:  Left hip fracture. Pre-op respiratory exam.  EXAM: PORTABLE CHEST - 1 VIEW  COMPARISON:  None.  FINDINGS: The heart size and mediastinal contours are within normal limits. Both lungs are clear. The visualized skeletal structures are unremarkable.  IMPRESSION: No  active disease.   Electronically Signed   By: Myles Rosenthal M.D.   On: 09/28/2013 14:48   Dg Hip Portable 1 View Left  09/28/2013   CLINICAL DATA:  Postop total hip replacement.  EXAM: PORTABLE LEFT HIP - 1 VIEW  COMPARISON:  09/28/2013  FINDINGS: Status post left hip arthroplasty. Surgical clips overlie the hip. There is postoperative gas following surgery. On the frontal view, there is no evidence for dislocation. No new fractures are identified  IMPRESSION: Status post left hip arthroplasty.  No adverse features identified.   Electronically Signed   By: Rosalie Gums M.D.   On: 09/28/2013 23:18         Subjective: Patient states that she had a bowel movement. She denies any fevers, chills, chest discomfort, shortness breath, nausea, vomiting, diarrhea, abdominal pain.  Objective: Filed Vitals:   09/30/13 1600 09/30/13 2130 10/01/13 0550 10/01/13 1432  BP:  152/72 163/80 129/75  Pulse:  91 80 98  Temp:  99.7 F (37.6 C) 98.3 F (36.8 C) 98.4 F (36.9 C)  TempSrc:  Oral Oral Oral  Resp: 16 16 16 16   Height:      Weight:      SpO2: 98% 97% 97% 98%    Intake/Output Summary (Last 24 hours) at 10/01/13 1659 Last data filed at 10/01/13 1440  Gross per 24 hour  Intake    480 ml  Output      0 ml  Net    480 ml   Weight change:  Exam:   General:  Pt is alert, follows commands  appropriately, not in acute distress  HEENT: No icterus, No thrush,  [place]/AT  Cardiovascular: RRR, S1/S2, no rubs, no gallops  Respiratory: CTA bilaterally, no wheezing, no crackles, no rhonchi  Abdomen: Soft/+BS, non tender, non distended, no guarding  Extremities: No edema, No lymphangitis, No petechiae, No rashes, no synovitis  Data Reviewed: Basic Metabolic Panel:  Recent Labs Lab 09/28/13 1456 09/29/13 0452 09/30/13 0414  NA 143 137 136  K 3.4* 4.3 3.9  CL 107 105 105  CO2 24 23 20   GLUCOSE 95 145* 117*  BUN 20 17 16   CREATININE 0.66 0.62 0.58  CALCIUM 9.5 8.3* 8.9   Liver Function  Tests: No results found for this basename: AST, ALT, ALKPHOS, BILITOT, PROT, ALBUMIN,  in the last 168 hours No results found for this basename: LIPASE, AMYLASE,  in the last 168 hours No results found for this basename: AMMONIA,  in the last 168 hours CBC:  Recent Labs Lab 09/28/13 1456 09/29/13 0452 09/30/13 0414 10/01/13 0509  WBC 12.3* 6.8 11.8* 9.8  NEUTROABS 10.7*  --   --   --   HGB 13.3 11.1* 11.7* 11.3*  HCT 40.0 33.9* 36.0 34.8*  MCV 90.7 91.1 92.3 93.5  PLT 228 188 210 190   Cardiac Enzymes: No results found for this basename: CKTOTAL, CKMB, CKMBINDEX, TROPONINI,  in the last 168 hours BNP: No components found with this basename: POCBNP,  CBG: No results found for this basename: GLUCAP,  in the last 168 hours  Recent Results (from the past 240 hour(s))  URINE CULTURE     Status: None   Collection Time    09/28/13  3:39 PM      Result Value Range Status   Specimen Description URINE, RANDOM   Final   Special Requests NONE   Final   Culture  Setup Time     Final   Value: 09/28/2013 21:55     Performed at [facility] Count     Final   Value: 30,000 COLONIES/ML     Performed at [facility]   Culture     Final   Value: Multiple bacterial morphotypes present, none predominant. Suggest appropriate recollection if clinically indicated.     Performed at [facility]   Report Status 09/29/2013 FINAL   Final  SURGICAL PCR SCREEN     Status: None   Collection Time    09/28/13  5:40 PM      Result Value Range Status   MRSA, PCR NEGATIVE  NEGATIVE Final   Staphylococcus aureus NEGATIVE  NEGATIVE Final   Comment:            The Xpert SA Assay (FDA     approved for NASAL specimens     in patients over 77 years of age),     is one component of     a comprehensive surveillance     program.  Test performance has     been validated by [employer] for patients greater     than or equal to 39 year old.     It is not intended      to diagnose infection nor to     guide or monitor treatment.     Scheduled Meds: . brinzolamide  1 drop Both Eyes BID  . docusate sodium  100 mg Oral BID  . enoxaparin (LOVENOX) injection  40 mg Subcutaneous Q24H  . latanoprost  1 drop  Both Eyes QHS  . lisinopril  5 mg Oral Daily   Continuous Infusions:    Alayna Mabe, DO  [facility] Pager [phone]  If 7PM-7AM, please contact night-coverage [email or web address] Password [email or web address] 10/01/2013, 4:59 PM   LOS: 3 days

## 2013-10-01 NOTE — Discharge Summary (Signed)
Physician Discharge Summary  Julia Hampton:[id number] DOB: [birth year] DOA: 09/28/2013  PCP: No primary provider on file.  Admit date: 09/28/2013 Discharge date: 10/02/2013  Recommendations for Outpatient Follow-up:  1. Pt will need to follow up with PCP in 2 weeks post discharge 2. Please obtain BMP to evaluate electrolytes and kidney function 3. Please also check CBC to evaluate Hg and Hct levels 4. Follow up with Dr. Shelle Iron in 2 weeks  Discharge Diagnoses:  Principal Problem:   Hip fracture Active Problems:   HTN (hypertension)   UTI (urinary tract infection)   Unspecified constipation Left subcapital hip fracture  -Status post left bipolar hip arthroplasty on 09/28/13--Dr. Shelle Iron -Appreciate orthopedics  -Continue physical therapy  -Pain contro-->Norco -DVT prophylaxis per orthopedic recommendations-->Lovenox [place] 40mg  [place] daily  Hypertension  -Elevated blood pressure partly due to pain  -However blood pressure remains elevated and pain is controlled at this time  -started lisinopril 5 mg and monitor electrolytes  Asymptomatic bacteriuria  -No pyuria on UA  -Discontinued antibiotics-->remained stable and afebrile  Glaucoma  -Continue home eye drops--Azopt and Lumigan Constipation  -start cathartics--->+BM   Discharge Condition: stable  Disposition:  Follow-up Information   Follow up with BEANE,JEFFREY C, MD In 2 weeks.   Specialty:  Orthopedic Surgery   Contact information:   [address] [place] [address] [phone]       Diet:regular Wt Readings from Last 3 Encounters:  09/28/13 43.999 kg (97 lb)  09/28/13 43.999 kg (97 lb)    History of present illness:  79 yo female who presented to [facility] ED after an episode of fall at home, she fell on her left hip and has not been able to bear weight and walk. Patient states that she was pulled by her dog as her dog ran on his leash.  The dog dragged her causing her to fall. She denies any  specific symptoms prior to the fall, no chest pain, no shortness of breath, no abdominal or urinary concerns, syncope, no specific focal neurological symptoms. In ED, she was found to have sustained left hip fracture and ortho team was consulted for further evaluation and management. [facility] asked to admit to medical floor.        Consultants: [facility]--Dr. Shelle Iron  Discharge Exam: Filed Vitals:   10/02/13 0953  BP: 145/78  Pulse: 91  Temp:   Resp:    Filed Vitals:   10/01/13 1432 10/01/13 2130 10/02/13 0515 10/02/13 0953  BP: 129/75 130/73 121/72 145/78  Pulse: 98 108 94 91  Temp: 98.4 F (36.9 C) 98.9 F (37.2 C) 98 F (36.7 C)   TempSrc: Oral Oral Oral   Resp: 16 18 18    Height:      Weight:      SpO2: 98% 96% 96%    General: A&O x 3, NAD, pleasant, cooperative Cardiovascular: RRR, no rub, no gallop, no S3 Respiratory: CTAB, no wheeze, no rhonchi Abdomen:soft, nontender, nondistended, positive bowel sounds Extremities: No edema, No lymphangitis, no petechiae  Discharge Instructions      Discharge Orders   Future Orders Complete By Expires   Full weight bearing  As directed    Questions:     Laterality:     Extremity:         Medication List         acetaminophen 500 MG tablet  Commonly known as:  TYLENOL  Take 500 mg by mouth every 6 (six) hours as needed.  bimatoprost 0.01 % Soln  Commonly known as:  LUMIGAN  Place 1 drop into both eyes at bedtime.     brinzolamide 1 % ophthalmic suspension  Commonly known as:  AZOPT  Place 1 drop into both eyes 2 (two) times daily.     DSS 100 MG Caps  Take 100 mg by mouth 2 (two) times daily as needed for mild constipation.     enoxaparin 30 MG/0.3ML injection  Commonly known as:  LOVENOX  Inject 0.4 mLs (40 mg total) into the skin daily.     HYDROcodone-acetaminophen 5-325 MG per tablet  Commonly known as:  NORCO/VICODIN  Take 1-2 tablets by mouth every 4 (four) hours as needed.     lisinopril 5 MG  tablet  Commonly known as:  PRINIVIL,ZESTRIL  Take 1 tablet (5 mg total) by mouth daily.         The results of significant diagnostics from this hospitalization (including imaging, microbiology, ancillary and laboratory) are listed below for reference.    Significant Diagnostic Studies: Dg Hip Complete Left  09/28/2013   CLINICAL DATA:  Status post fall.  Left hip pain.  EXAM: LEFT HIP - COMPLETE 2+ VIEW  COMPARISON:  None.  FINDINGS: The patient has a subcapital left hip fracture. No other acute bony or joint abnormality is identified.  IMPRESSION: Acute subcapital left hip fracture.   Electronically Signed   By: Drusilla Kanner M.D.   On: 09/28/2013 14:13   Dg Pelvis Portable  09/28/2013   CLINICAL DATA:  Postop left hip arthroplasty  EXAM: PORTABLE PELVIS 1-2 VIEWS  COMPARISON:  09/28/2013 at 1356 hr  FINDINGS: There is a new left hip prosthesis as well seated and aligned. There is no new fracture or evidence of an operative complication.  IMPRESSION: New left hip prosthesis is well-seated and aligned.   Electronically Signed   By: Amie Portland M.D.   On: 09/28/2013 21:01   Dg Chest Port 1 View  09/28/2013   CLINICAL DATA:  Left hip fracture. Pre-op respiratory exam.  EXAM: PORTABLE CHEST - 1 VIEW  COMPARISON:  None.  FINDINGS: The heart size and mediastinal contours are within normal limits. Both lungs are clear. The visualized skeletal structures are unremarkable.  IMPRESSION: No active disease.   Electronically Signed   By: Myles Rosenthal M.D.   On: 09/28/2013 14:48   Dg Hip Portable 1 View Left  09/28/2013   CLINICAL DATA:  Postop total hip replacement.  EXAM: PORTABLE LEFT HIP - 1 VIEW  COMPARISON:  09/28/2013  FINDINGS: Status post left hip arthroplasty. Surgical clips overlie the hip. There is postoperative gas following surgery. On the frontal view, there is no evidence for dislocation. No new fractures are identified  IMPRESSION: Status post left hip arthroplasty.  No adverse  features identified.   Electronically Signed   By: Rosalie Gums M.D.   On: 09/28/2013 23:18     Microbiology: Recent Results (from the past 240 hour(s))  URINE CULTURE     Status: None   Collection Time    09/28/13  3:39 PM      Result Value Range Status   Specimen Description URINE, RANDOM   Final   Special Requests NONE   Final   Culture  Setup Time     Final   Value: 09/28/2013 21:55     Performed at [facility]     Final   Value: 30,000 COLONIES/ML     Performed at  [facility]     Final   Value: Multiple bacterial morphotypes present, none predominant. Suggest appropriate recollection if clinically indicated.     Performed at [facility]   Report Status 09/29/2013 FINAL   Final  SURGICAL PCR SCREEN     Status: None   Collection Time    09/28/13  5:40 PM      Result Value Range Status   MRSA, PCR NEGATIVE  NEGATIVE Final   Staphylococcus aureus NEGATIVE  NEGATIVE Final   Comment:            The Xpert SA Assay (FDA     approved for NASAL specimens     in patients over 2 years of age),     is one component of     a comprehensive surveillance     program.  Test performance has     been validated by [employer] for patients greater     than or equal to 57 year old.     It is not intended     to diagnose infection nor to     guide or monitor treatment.     Labs: Basic Metabolic Panel:  Recent Labs Lab 09/28/13 1456 09/29/13 0452 09/30/13 0414  NA 143 137 136  K 3.4* 4.3 3.9  CL 107 105 105  CO2 24 23 20   GLUCOSE 95 145* 117*  BUN 20 17 16   CREATININE 0.66 0.62 0.58  CALCIUM 9.5 8.3* 8.9   Liver Function Tests: No results found for this basename: AST, ALT, ALKPHOS, BILITOT, PROT, ALBUMIN,  in the last 168 hours No results found for this basename: LIPASE, AMYLASE,  in the last 168 hours No results found for this basename: AMMONIA,  in the last 168 hours CBC:  Recent Labs Lab 09/28/13 1456  09/29/13 0452 09/30/13 0414 10/01/13 0509  WBC 12.3* 6.8 11.8* 9.8  NEUTROABS 10.7*  --   --   --   HGB 13.3 11.1* 11.7* 11.3*  HCT 40.0 33.9* 36.0 34.8*  MCV 90.7 91.1 92.3 93.5  PLT 228 188 210 190   Cardiac Enzymes: No results found for this basename: CKTOTAL, CKMB, CKMBINDEX, TROPONINI,  in the last 168 hours BNP: No components found with this basename: POCBNP,  CBG: No results found for this basename: GLUCAP,  in the last 168 hours  Time coordinating discharge:  Greater than 30 minutes  Signed:  Yohana Bartha, DO [facility] Pager: [phone] 10/02/2013, 11:12 AM

## 2013-10-01 NOTE — Progress Notes (Signed)
   Subjective: 3 Days Post-Op Procedure(s) (LRB): ARTHROPLASTY BIPOLAR HIP (Left)  S/p left hip hemi doing well Patient reports pain as mild.  Objective:   VITALS:   Filed Vitals:   10/01/13 0550  BP: 163/80  Pulse: 80  Temp: 98.3 F (36.8 C)  Resp: 16    Left hip incision Healing well nv intact distally No rashes or edema  LABS  Recent Labs  09/29/13 0452 09/30/13 0414 10/01/13 0509  HGB 11.1* 11.7* 11.3*  HCT 33.9* 36.0 34.8*  WBC 6.8 11.8* 9.8  PLT 188 210 190     Recent Labs  09/28/13 1456 09/29/13 0452 09/30/13 0414  NA 143 137 136  K 3.4* 4.3 3.9  BUN 20 17 16   CREATININE 0.66 0.62 0.58  GLUCOSE 95 145* 117*     Assessment/Plan: 3 Days Post-Op Procedure(s) (LRB): ARTHROPLASTY BIPOLAR HIP (Left)  Pt doing well D/c in the next few days once medically stable F/u with ortho in 2 weeks   Alphonsa Overall, MPAS, PA-C  10/01/2013, 7:48 AM

## 2013-10-02 DIAGNOSIS — IMO0002 Reserved for concepts with insufficient information to code with codable children: Secondary | ICD-10-CM

## 2013-10-02 MED ORDER — ENOXAPARIN SODIUM 30 MG/0.3ML ~~LOC~~ SOLN
30.0000 mg | SUBCUTANEOUS | Status: DC
  Filled 2013-10-02 (×2): qty 0.3

## 2013-10-02 NOTE — Care Management Note (Signed)
    Page 1 of 1   10/02/2013     11:28:54 AM   CARE MANAGEMENT NOTE 10/02/2013  Patient:  Julia Hampton, Julia Hampton   Account Number:  [id number]  Date Initiated:  10/02/2013  Documentation initiated by:  Lorenda Ishihara  Subjective/Objective Assessment:   79 yo female admitted s/p fall, s/p left bipolar hip arthroplasty on 09/28/13. PTA lived at home alone.     Action/Plan:   SNF for rehab   Anticipated DC Date:  10/02/2013   Anticipated DC Plan:  SKILLED NURSING FACILITY  In-house referral  Clinical Social Worker      DC Planning Services  CM consult      Choice offered to / List presented to:             Status of service:  Completed, signed off Medicare Important Message given?   (If response is "NO", the following Medicare IM given date fields will be blank) Date Medicare IM given:   Date Additional Medicare IM given:    Discharge Disposition:  SKILLED NURSING FACILITY  Per UR Regulation:  Reviewed for med. necessity/level of care/duration of stay  If discussed at Long Length of Stay Meetings, dates discussed:    Comments:

## 2013-10-02 NOTE — Progress Notes (Signed)
Physical Therapy Treatment Patient Details Name: Julia Hampton MRN: [id number] DOB: Mar 22, 1930 Today's Date: 10/02/2013 Time: 4098-1191 PT Time Calculation (min): 16 min  PT Assessment / Plan / Recommendation  History of Present Illness L hip hemiarthoplasty   PT Comments   **Pt progressing well with mobility. She is ready to DC to SNF from PT standpoint. *  Follow Up Recommendations  SNF     Does the patient have the potential to tolerate intense rehabilitation     Barriers to Discharge        Equipment Recommendations  Rolling walker with 5" wheels    Recommendations for Other Services OT consult  Frequency 7X/week   Progress towards PT Goals Progress towards PT goals: Progressing toward goals  Plan Current plan remains appropriate    Precautions / Restrictions Precautions Precautions: Posterior Hip;Fall Precaution Comments: Pt recalls 2/3 THP REviewed THP Restrictions Weight Bearing Restrictions: No Other Position/Activity Restrictions: WBAT   Pertinent Vitals/Pain *1/10 L hip Ice applied**    Mobility  Bed Mobility Bed Mobility: Not assessed Details for Bed Mobility Assistance: assist for LLE Transfers Transfers: Sit to Stand;Stand to Sit Sit to Stand: From chair/3-in-1;5: Supervision Stand to Sit: To chair/3-in-1;With armrests;With upper extremity assist;5: Supervision Details for Transfer Assistance: cues for UE and LE position Ambulation/Gait Ambulation/Gait Assistance: 5: Supervision Ambulation Distance (Feet): 110 Feet Assistive device: Rolling walker Gait Pattern: Step-to pattern;Decreased step length - right;Decreased step length - left Gait velocity: decr General Gait Details: steady, no LOB, good sequencing and posture Stairs: No    Exercises Total Joint Exercises Ankle Circles/Pumps: AROM;15 reps;Supine;Both Short Arc Quad: AROM;Left;15 reps;Supine Heel Slides: AAROM;15 reps;Supine;Left Hip ABduction/ADduction: AAROM;Left;10 reps;Supine    PT Diagnosis:    PT Problem List:   PT Treatment Interventions:     PT Goals (current goals can now be found in the care plan section) Acute Rehab PT Goals Patient Stated Goal: rehab then home to dog and cat PT Goal Formulation: With patient Time For Goal Achievement: 10/05/13 Potential to Achieve Goals: Good  Visit Information  Last PT Received On: 10/02/13 Assistance Needed: +1 History of Present Illness: L hip hemiarthoplasty    Subjective Data  Patient Stated Goal: rehab then home to dog and cat   Cognition  Cognition Arousal/Alertness: Awake/alert Behavior During Therapy: WFL for tasks assessed/performed Overall Cognitive Status: Within Functional Limits for tasks assessed    Balance     End of Session PT - End of Session Equipment Utilized During Treatment: Gait belt Activity Tolerance: Patient tolerated treatment well Patient left: in chair;with call bell/phone within reach Nurse Communication: Mobility status   GP     Ralene Bathe Kistler 10/02/2013, 11:42 AM [phone]

## 2013-10-02 NOTE — Progress Notes (Signed)
Patient awaiting for family member to bring clothes for transport to [facility] for rehab. Prefers to have family member transfer her via vehicle vs ambulance. Report called to receiving nurse at [facility]. No c/o pain/discomfort to left hip at this time. Will medicate for pain prior transfer prn. Patient has discharge packet in room to give to receiving nurse upon arrival to facility.

## 2013-10-02 NOTE — Clinical Social Work Placement (Signed)
       Clinical Social Work Department CLINICAL SOCIAL WORK PLACEMENT NOTE 10/02/2013  Patient:  Julia Hampton, Julia Hampton  Account Number:  [id number] Admit date:  09/28/2013  Clinical Social Worker:  Becky Sax, LCSW  Date/time:  10/02/2013 12:00 M  Clinical Social Work is seeking post-discharge placement for this patient at the following level of care:   SKILLED NURSING   (*CSW will update this form in Epic as items are completed)   10/02/2013  Patient/family provided with [facility] Department of Clinical Social Works list of facilities offering this level of care within the geographic area requested by the patient (or if unable, by the patients family).  10/02/2013  Patient/family informed of their freedom to choose among providers that offer the needed level of care, that participate in Medicare, Medicaid or managed care program needed by the patient, have an available bed and are willing to accept the patient.  10/02/2013  Patient/family informed of [facility] ownership interest in [facility], as well as of the fact that they are under no obligation to receive care at this facility.  PASARR submitted to EDS on 10/02/2013 PASARR number received from EDS on 10/02/2013  FL2 transmitted to all facilities in geographic area requested by pt/family on  10/02/2013 FL2 transmitted to all facilities within larger geographic area on   Patient informed that his/her managed care company has contracts with or will negotiate with  certain facilities, including the following:     Patient/family informed of bed offers received:  10/02/2013 Patient chooses bed at [facility] Physician recommends and patient chooses bed at    Patient to be transferred to [facility] on  10/02/2013 Patient to be transferred to facility by car  The following physician request were entered in Epic:   Additional Comments:

## 2015-06-27 ENCOUNTER — Ambulatory Visit: Payer: Self-pay | Admitting: Rehabilitative and Restorative Service Providers"

## 2015-08-28 ENCOUNTER — Other Ambulatory Visit: Payer: Self-pay

## 2015-08-28 DIAGNOSIS — Z1231 Encounter for screening mammogram for malignant neoplasm of breast: Secondary | ICD-10-CM

## 2015-10-04 ENCOUNTER — Ambulatory Visit
Admission: RE | Admit: 2015-10-04 | Discharge: 2015-10-04 | Disposition: A | Discharge status: Home / Self Care | Payer: Medicare Other | Source: Ambulatory Visit

## 2015-10-04 DIAGNOSIS — Z1231 Encounter for screening mammogram for malignant neoplasm of breast: Secondary | ICD-10-CM

## 2017-01-14 ENCOUNTER — Encounter (HOSPITAL_COMMUNITY): Payer: Self-pay

## 2017-01-14 ENCOUNTER — Inpatient Hospital Stay (HOSPITAL_COMMUNITY): Payer: Medicare Other

## 2017-01-14 ENCOUNTER — Inpatient Hospital Stay (HOSPITAL_COMMUNITY)
Admission: AD | Admit: 2017-01-14 | Discharge: 2017-01-17 | DRG: 193 | Disposition: A | Discharge status: Home / Self Care | Payer: Medicare Other | Source: Ambulatory Visit | Attending: Internal Medicine | Admitting: Internal Medicine

## 2017-01-14 DIAGNOSIS — Z8249 Family history of ischemic heart disease and other diseases of the circulatory system: Secondary | ICD-10-CM | POA: Diagnosis not present

## 2017-01-14 DIAGNOSIS — I10 Essential (primary) hypertension: Secondary | ICD-10-CM | POA: Diagnosis present

## 2017-01-14 DIAGNOSIS — R05 Cough: Secondary | ICD-10-CM

## 2017-01-14 DIAGNOSIS — Z681 Body mass index (BMI) 19 or less, adult: Secondary | ICD-10-CM | POA: Diagnosis not present

## 2017-01-14 DIAGNOSIS — R059 Cough, unspecified: Secondary | ICD-10-CM

## 2017-01-14 DIAGNOSIS — J189 Pneumonia, unspecified organism: Secondary | ICD-10-CM | POA: Diagnosis present

## 2017-01-14 DIAGNOSIS — Z888 Allergy status to other drugs, medicaments and biological substances status: Secondary | ICD-10-CM | POA: Diagnosis not present

## 2017-01-14 DIAGNOSIS — R636 Underweight: Secondary | ICD-10-CM | POA: Insufficient documentation

## 2017-01-14 DIAGNOSIS — E43 Unspecified severe protein-calorie malnutrition: Secondary | ICD-10-CM | POA: Diagnosis present

## 2017-01-14 DIAGNOSIS — H409 Unspecified glaucoma: Secondary | ICD-10-CM | POA: Diagnosis present

## 2017-01-14 LAB — CBC
HEMATOCRIT: 36.1 % (ref 36.0–46.0)
Hemoglobin: 11.8 g/dL — ABNORMAL LOW (ref 12.0–15.0)
MCH: 29.6 pg (ref 26.0–34.0)
MCHC: 32.7 g/dL (ref 30.0–36.0)
MCV: 90.5 fL (ref 78.0–100.0)
Platelets: 516 10*3/uL — ABNORMAL HIGH (ref 150–400)
RBC: 3.99 MIL/uL (ref 3.87–5.11)
RDW: 12.8 % (ref 11.5–15.5)
WBC: 23.4 10*3/uL — ABNORMAL HIGH (ref 4.0–10.5)

## 2017-01-14 LAB — CREATININE, SERUM
Creatinine, Ser: 0.54 mg/dL (ref 0.44–1.00)
GFR calc Af Amer: >60 mL/min (ref >60)
GFR calc non Af Amer: >60 mL/min (ref >60)

## 2017-01-14 MED ORDER — ASPIRIN EC 81 MG PO TBEC
81.0000 mg | DELAYED_RELEASE_TABLET | Freq: Every day | ORAL | Status: DC
  Administered 2017-01-14 – 2017-01-17 (×4): 81 mg via ORAL
  Filled 2017-01-14 (×4): qty 1

## 2017-01-14 MED ORDER — ONDANSETRON HCL 4 MG PO TABS
4.0000 mg | ORAL_TABLET | Freq: Four times a day (QID) | ORAL | Status: DC | PRN
  Administered 2017-01-15 – 2017-01-16 (×2): 4 mg via ORAL
  Filled 2017-01-14 (×2): qty 1

## 2017-01-14 MED ORDER — ENSURE ENLIVE PO LIQD
237.0000 mL | Freq: Two times a day (BID) | ORAL | Status: DC
  Administered 2017-01-14 – 2017-01-15 (×2): 237 mL via ORAL

## 2017-01-14 MED ORDER — ACETAMINOPHEN 325 MG PO TABS
650.0000 mg | ORAL_TABLET | Freq: Four times a day (QID) | ORAL | Status: DC | PRN
  Administered 2017-01-15: 650 mg via ORAL
  Filled 2017-01-14: qty 2

## 2017-01-14 MED ORDER — AZITHROMYCIN 500 MG IV SOLR
500.0000 mg | INTRAVENOUS | Status: DC
  Administered 2017-01-15 – 2017-01-16 (×2): 500 mg via INTRAVENOUS
  Filled 2017-01-14 (×3): qty 500

## 2017-01-14 MED ORDER — LISINOPRIL 10 MG PO TABS
10.0000 mg | ORAL_TABLET | Freq: Every day | ORAL | Status: DC
  Administered 2017-01-15: 10 mg via ORAL
  Filled 2017-01-14: qty 1

## 2017-01-14 MED ORDER — LATANOPROST 0.005 % OP SOLN
1.0000 [drp] | Freq: Every day | OPHTHALMIC | Status: DC
  Administered 2017-01-14 – 2017-01-16 (×3): 1 [drp] via OPHTHALMIC
  Filled 2017-01-14: qty 2.5

## 2017-01-14 MED ORDER — ADULT MULTIVITAMIN W/MINERALS CH
1.0000 | ORAL_TABLET | Freq: Every day | ORAL | Status: DC
  Administered 2017-01-15 – 2017-01-17 (×3): 1 via ORAL
  Filled 2017-01-14 (×3): qty 1

## 2017-01-14 MED ORDER — HEPARIN SODIUM (PORCINE) 5000 UNIT/ML IJ SOLN
5000.0000 [IU] | Freq: Three times a day (TID) | INTRAMUSCULAR | Status: DC
  Administered 2017-01-14 – 2017-01-17 (×8): 5000 [IU] via SUBCUTANEOUS
  Filled 2017-01-14 (×9): qty 1

## 2017-01-14 MED ORDER — DEXTROSE 5 % IV SOLN
1.0000 g | INTRAVENOUS | Status: DC
  Administered 2017-01-15 – 2017-01-16 (×2): 1 g via INTRAVENOUS
  Filled 2017-01-14 (×3): qty 10

## 2017-01-14 MED ORDER — POLYETHYLENE GLYCOL 3350 17 G PO PACK: 17.0000 g | PACK | Freq: Every day | ORAL | Status: DC | PRN

## 2017-01-14 MED ORDER — ONDANSETRON HCL 4 MG/2ML IJ SOLN: 4.0000 mg | Freq: Four times a day (QID) | INTRAMUSCULAR | Status: DC | PRN

## 2017-01-14 MED ORDER — DEXTROSE 5 % IV SOLN
1.0000 g | INTRAVENOUS | Status: DC
  Administered 2017-01-14: 1 g via INTRAVENOUS
  Filled 2017-01-14: qty 10

## 2017-01-14 MED ORDER — BRINZOLAMIDE 1 % OP SUSP
1.0000 [drp] | Freq: Two times a day (BID) | OPHTHALMIC | Status: DC
  Administered 2017-01-14 – 2017-01-17 (×6): 1 [drp] via OPHTHALMIC
  Filled 2017-01-14: qty 10

## 2017-01-14 MED ORDER — ACETAMINOPHEN 500 MG PO TABS: 500.0000 mg | ORAL_TABLET | Freq: Four times a day (QID) | ORAL | Status: DC | PRN

## 2017-01-14 MED ORDER — SODIUM CHLORIDE 0.9 % IV SOLN
INTRAVENOUS | Status: AC
  Administered 2017-01-14: 16:00:00 via INTRAVENOUS

## 2017-01-14 MED ORDER — ACETAMINOPHEN 650 MG RE SUPP: 650.0000 mg | Freq: Four times a day (QID) | RECTAL | Status: DC | PRN

## 2017-01-14 MED ORDER — DEXTROSE 5 % IV SOLN
500.0000 mg | INTRAVENOUS | Status: DC
  Administered 2017-01-14: 500 mg via INTRAVENOUS
  Filled 2017-01-14: qty 500

## 2017-01-14 MED ORDER — TIMOLOL MALEATE 0.5 % OP SOLN
1.0000 [drp] | Freq: Every day | OPHTHALMIC | Status: DC
  Administered 2017-01-15 – 2017-01-16 (×3): 1 [drp] via OPHTHALMIC
  Filled 2017-01-14: qty 5

## 2017-01-14 NOTE — Progress Notes (Signed)
83 year old female goes to the PCP office for 2 weeks of cough and SOB progressively getting worst. Denies fever, sat at the PCP office was 90% on RA, BP 118/68 and RR 16, hr nor check. CXR done at PCP office that showed multifocal PNA. She is admitted to med-surg floor.

## 2017-01-14 NOTE — H&P (Signed)
History and Physical    Julia Hampton YWV:[id number] DOB: [birth year] DOA: 01/14/2017  PCP: Anthoney Harada, MD  Patient coming from: home   Chief Complaint: cough  HPI: Julia Hampton is a 83 y.o. female with medical history significant of past medical history of hypertension who comes in today PCPs office for cough and shortness of breath that started 2 weeks prior to admission, she relates she been getting progressively short of breath with a productive cough she has been sleeping in a recliner for the last 2 days due to the cough, with generalized weakness decreased appetite she denies any fever, nausea, vomiting, diarrhea she denies any chest pain. An x-ray was done at the PCPs office that show multifocal pneumonias were consulted for direct admission and further evaluation.  Review of Systems: As per HPI otherwise 10 point review of systems negative.    Past Medical History:  Diagnosis Date  . Complication of anesthesia    trouble waking patient up  . Glaucoma     Past Surgical History:  Procedure Laterality Date  . ABDOMINAL HYSTERECTOMY    . CHOLECYSTECTOMY    . HIP ARTHROPLASTY Left 09/28/2013   Procedure: ARTHROPLASTY BIPOLAR HIP;  Surgeon: Johnn Hai, MD;  Location: [facility];  Service: Orthopedics;  Laterality: Left;  Marland Kitchen MASTOIDECTOMY       reports that she has never smoked. She has never used smokeless tobacco. She reports that she does not drink alcohol or use drugs.  Allergies  Allergen Reactions  . Other Other (See Comments)    Glaucoma drop with a dark green top - not sure of name -    Family History  Problem Relation Age of Onset  . Heart failure Mother   . Parkinson's disease Father     Prior to Admission medications   Medication Sig Start Date End Date Taking? Authorizing Provider  acetaminophen (TYLENOL) 500 MG tablet Take 500 mg by mouth every 6 (six) hours as needed.   Yes Historical Provider, MD  Ascorbic Acid (VITAMIN C PO) Take by  mouth.   Yes Historical Provider, MD  aspirin EC 81 MG tablet Take 81 mg by mouth daily.   Yes Historical Provider, MD  bimatoprost (LUMIGAN) 0.01 % SOLN Place 1 drop into both eyes at bedtime.   Yes Historical Provider, MD  brinzolamide (AZOPT) 1 % ophthalmic suspension Place 1 drop into both eyes 2 (two) times daily.    Yes Historical Provider, MD  lisinopril (PRINIVIL,ZESTRIL) 10 MG tablet Take 10 mg by mouth daily.   Yes Historical Provider, MD  Multiple Vitamin (MULTIVITAMIN WITH MINERALS) TABS tablet Take 1 tablet by mouth daily.   Yes Historical Provider, MD  timolol (TIMOPTIC) 0.5 % ophthalmic solution Place 1 drop into both eyes daily after lunch.   Yes Historical Provider, MD    Physical Exam: Vitals:   01/14/17 1422 01/14/17 1442  BP: (!) 171/85 (!) 154/69  Pulse: (!) 105 90  Resp: 17 18  Temp: 98.2 F (36.8 C) 98.7 F (37.1 C)  TempSrc: Oral Oral  SpO2: 96% 97%  Weight: 36.7 kg (81 lb)   Height: 5' 1"  (1.549 m)     Constitutional: NAD, calm, comfortable, Cachectic appearing Vitals:   01/14/17 1422 01/14/17 1442  BP: (!) 171/85 (!) 154/69  Pulse: (!) 105 90  Resp: 17 18  Temp: 98.2 F (36.8 C) 98.7 F (37.1 C)  TempSrc: Oral Oral  SpO2: 96% 97%  Weight: 36.7 kg (81 lb)   Height:  5' 1"  (1.549 m)    Eyes: PERRL, lids and conjunctivae normal ENMT: Mucous membranes are moist. Posterior pharynx clear of any exudate or lesions.Normal dentition.  Neck: normal, supple, no masses, no thyromegaly Respiratory: Good air movement with diffuse crackles bilaterally no wheezing normal respiratory effort. Cardiovascular: Regular rate and rhythm, no murmurs / rubs / gallops.   Abdomen: no tenderness, no masses palpated. No hepatosplenomegaly. Bowel sounds positive.  Musculoskeletal: no clubbing / cyanosis. No joint deformity upper and lower extremities. Good ROM, no contractures. Normal muscle tone.  Skin: no rashes, lesions, ulcers. No induration Neurologic: CN 2-12 grossly  intact. Sensation intact, DTR normal. Strength 5/5 in all 4.  Psychiatric: Normal judgment and insight. Alert and oriented x 3. Normal mood.     Labs on Admission: I have personally reviewed following labs and imaging studies  CBC: No results for input(s): WBC, NEUTROABS, HGB, HCT, MCV, PLT in the last 168 hours. Basic Metabolic Panel: No results for input(s): NA, K, CL, CO2, GLUCOSE, BUN, CREATININE, CALCIUM, MG, PHOS in the last 168 hours. GFR: CrCl cannot be calculated (Patient's most recent lab result is older than the maximum 21 days allowed.). Liver Function Tests: No results for input(s): AST, ALT, ALKPHOS, BILITOT, PROT, ALBUMIN in the last 168 hours. No results for input(s): LIPASE, AMYLASE in the last 168 hours. No results for input(s): AMMONIA in the last 168 hours. Coagulation Profile: No results for input(s): INR, PROTIME in the last 168 hours. Cardiac Enzymes: No results for input(s): CKTOTAL, CKMB, CKMBINDEX, TROPONINI in the last 168 hours. BNP (last 3 results) No results for input(s): PROBNP in the last 8760 hours. HbA1C: No results for input(s): HGBA1C in the last 72 hours. CBG: No results for input(s): GLUCAP in the last 168 hours. Lipid Profile: No results for input(s): CHOL, HDL, LDLCALC, TRIG, CHOLHDL, LDLDIRECT in the last 72 hours. Thyroid Function Tests: No results for input(s): TSH, T4TOTAL, FREET4, T3FREE, THYROIDAB in the last 72 hours. Anemia Panel: No results for input(s): VITAMINB12, FOLATE, FERRITIN, TIBC, IRON, RETICCTPCT in the last 72 hours. Urine analysis:    Component Value Date/Time   COLORURINE YELLOW 09/28/2013 1439   APPEARANCEUR CLOUDY (A) 09/28/2013 1439   LABSPEC 1.008 09/28/2013 1439   PHURINE 7.0 09/28/2013 1439   GLUCOSEU NEGATIVE 09/28/2013 1439   HGBUR NEGATIVE 09/28/2013 1439   BILIRUBINUR NEGATIVE 09/28/2013 1439   KETONESUR NEGATIVE 09/28/2013 1439   PROTEINUR NEGATIVE 09/28/2013 1439   UROBILINOGEN 0.2 09/28/2013 1439     NITRITE NEGATIVE 09/28/2013 1439   LEUKOCYTESUR MODERATE (A) 09/28/2013 1439    Radiological Exams on Admission: No results found.  EKG: Independently reviewed.none  Assessment/Plan CAP (community acquired pneumonia): Sounds like community-acquired pneumonia, she has crackles on lungs with a productive cough, she is weak on physical exam her vitals are stable. I will start her on empiric antibiotics Rocephin and azithromycin get a sputum culture in the blood cultures. Get urine antigens. CBC, chest x-ray to be met. Allow a  diet.  Essential  HTN (hypertension) Continue current home regimen.  DVT prophylaxis: heparin Code Status: full Family Communication: Daughters Disposition Plan: inpatient Consults called: none Admission status: inpatient   Charlynne Cousins MD [facility] Pager [phone]  If 7PM-7AM, please contact night-coverage [email or web address] Password [facility]  01/14/2017, 3:32 PM

## 2017-01-15 DIAGNOSIS — R05 Cough: Secondary | ICD-10-CM

## 2017-01-15 LAB — CBC
HEMATOCRIT: 30.7 % — AB (ref 36.0–46.0)
HEMOGLOBIN: 10.2 g/dL — AB (ref 12.0–15.0)
MCH: 30.3 pg (ref 26.0–34.0)
MCHC: 33.2 g/dL (ref 30.0–36.0)
MCV: 91.1 fL (ref 78.0–100.0)
Platelets: 519 10*3/uL — ABNORMAL HIGH (ref 150–400)
RBC: 3.37 MIL/uL — ABNORMAL LOW (ref 3.87–5.11)
RDW: 12.9 % (ref 11.5–15.5)
WBC: 18.9 10*3/uL — ABNORMAL HIGH (ref 4.0–10.5)

## 2017-01-15 LAB — BASIC METABOLIC PANEL
Anion gap: 7 (ref 5–15)
BUN: 12 mg/dL (ref 6–20)
CHLORIDE: 102 mmol/L (ref 101–111)
CO2: 26 mmol/L (ref 22–32)
CREATININE: 0.46 mg/dL (ref 0.44–1.00)
Calcium: 8.3 mg/dL — ABNORMAL LOW (ref 8.9–10.3)
GFR calc Af Amer: >60 mL/min (ref >60)
GFR calc non Af Amer: >60 mL/min (ref >60)
Glucose, Bld: 103 mg/dL — ABNORMAL HIGH (ref 65–99)
POTASSIUM: 3.6 mmol/L (ref 3.5–5.1)
Sodium: 135 mmol/L (ref 135–145)

## 2017-01-15 LAB — BRAIN NATRIURETIC PEPTIDE: B Natriuretic Peptide: 161.7 pg/mL — ABNORMAL HIGH (ref 0.0–100.0)

## 2017-01-15 LAB — HIV ANTIBODY (ROUTINE TESTING W REFLEX): HIV SCREEN 4TH GENERATION: NONREACTIVE

## 2017-01-15 MED ORDER — CALCIUM POLYCARBOPHIL 625 MG PO TABS
1250.0000 mg | ORAL_TABLET | Freq: Every day | ORAL | Status: DC
  Administered 2017-01-16 – 2017-01-17 (×3): 1250 mg via ORAL
  Filled 2017-01-15 (×4): qty 2

## 2017-01-15 MED ORDER — ENSURE ENLIVE PO LIQD
237.0000 mL | Freq: Three times a day (TID) | ORAL | Status: DC
  Administered 2017-01-15 – 2017-01-17 (×5): 237 mL via ORAL

## 2017-01-15 MED ORDER — IPRATROPIUM-ALBUTEROL 0.5-2.5 (3) MG/3ML IN SOLN: 3.0000 mL | RESPIRATORY_TRACT | Status: DC | PRN

## 2017-01-15 NOTE — Progress Notes (Signed)
PROGRESS NOTE    Julia Hampton  WUJ:[id number] DOB: 1930-04-19 DOA: 01/14/2017 PCP: Redmond Baseman, MD   Brief Narrative:  83 year old female with past medical history of hypertension was admitted yesterday with complaints of cough and shortness of breath which had been ongoing for about 2 weeks. She went to go see her PCP and was noted to have multifocal pneumonia therefore referred for admission to the hospital. Upon admission there was suspicion for possible community-acquired pneumonia therefore started on ceftriaxone and azithromycin.   Assessment & Plan:   Active Problems:   HTN (hypertension)   CAP (community acquired pneumonia)  Mild shortness of breath and productive cough/possible community-acquired pneumonia -Sputum culture has been ordered, blood cultures sent -Day 2 of ceftriaxone and azithromycin. -Urine strep is negative at this time. -We will add DuoNeb as needed and incentive spirometry -If her symptoms does not improve with above treatment she may also need an echocardiogram for possible evaluation of underlying heart failure. But given elevated white count and chest x-ray findings, will pursue and treat pneumonia. -Antitussin medication  Hypertension -Continue aspirin and lisinopril at this time  She does feel some generalized weakness therefore will order for physical therapy. Weakness is likely due to underlying infection at this time. Encourage by mouth diet.    DVT prophylaxis:  Heparin subcutaneous Code Status: Full Family Communication:  Patient comprehends well.  Disposition Plan: Continue inpatient stay.  Consultants:   None  Procedures:   None  Antimicrobials:    Rocephin and azithromycin day 2  Subjective: Patient states she feels little better than yesterday but still continues to feel overall weakness. She tells me she never had fevers at home but does have this cough bringing up whitish to clear mucus. She also states she mostly  feels short of breath when she lays flat otherwise no lower extremity swelling, chest pain and other complaints. She denies any cardiac history at this time.  Objective: Vitals:   01/14/17 1422 01/14/17 1442 01/14/17 2043 01/15/17 0432  BP: (!) 171/85 (!) 154/69 (!) 145/68 (!) 146/68  Pulse: (!) 105 90 (!) 110 (!) 101  Resp: Temp: 98.2 F (36.8 C) 98.7 F (37.1 C) 98.7 F (37.1 C) 99 F (37.2 C)  TempSrc: Oral Oral Oral Oral  SpO2: 96% 97% 98% 99%  Weight: 36.7 kg (81 lb)     Height:  (1.549 m)       Intake/Output Summary (Last 24 hours) at 01/15/17 1244 Last data filed at 01/15/17 0945  Gross per 24 hour  Intake          1853.75 ml  Output                0 ml  Net          1853.75 ml   Filed Weights   01/14/17 1422  Weight: 36.7 kg (81 lb)    Examination:  General exam: Appears calm and comfortable  Respiratory system: Bibasilar crackles but no wheezing. Cardiovascular system: S1 & S2 heard, RRR. No JVD, + systolic murmurs, rubs, gallops or clicks. No pedal edema. Gastrointestinal system: Abdomen is nondistended, soft and nontender. No organomegaly or masses felt. Normal bowel sounds heard. Central nervous system: Alert and oriented. No focal neurological deficits. Extremities: Symmetric 5 x 5 power. Skin: No rashes, lesions or ulcers Psychiatry: Judgement and insight appear normal. Mood & affect appropriate.     Data Reviewed:   CBC:  Recent Labs Lab 01/14/17 1557  01/15/17 0354  WBC 23.4* 18.9*  HGB 11.8* 10.2*  HCT 36.1 30.7*  MCV 90.5 91.1  PLT 516* 519*   Basic Metabolic Panel:  Recent Labs Lab 01/14/17 1557 01/15/17 0354  NA  --  135  K  --  3.6  CL  --  102  CO2  --  26  GLUCOSE  --  103*  BUN  --  12  CREATININE 0.54 0.46  CALCIUM  --  8.3*   GFR: Estimated Creatinine Clearance: 29.8 mL/min (by C-G formula based on SCr of 0.46 mg/dL). Liver Function Tests: No results for input(s): AST, ALT, ALKPHOS, BILITOT, PROT,  ALBUMIN in the last 168 hours. No results for input(s): LIPASE, AMYLASE in the last 168 hours. No results for input(s): AMMONIA in the last 168 hours. Coagulation Profile: No results for input(s): INR, PROTIME in the last 168 hours. Cardiac Enzymes: No results for input(s): CKTOTAL, CKMB, CKMBINDEX, TROPONINI in the last 168 hours. BNP (last 3 results) No results for input(s): PROBNP in the last 8760 hours. HbA1C: No results for input(s): HGBA1C in the last 72 hours. CBG: No results for input(s): GLUCAP in the last 168 hours. Lipid Profile: No results for input(s): CHOL, HDL, LDLCALC, TRIG, CHOLHDL, LDLDIRECT in the last 72 hours. Thyroid Function Tests: No results for input(s): TSH, T4TOTAL, FREET4, T3FREE, THYROIDAB in the last 72 hours. Anemia Panel: No results for input(s): VITAMINB12, FOLATE, FERRITIN, TIBC, IRON, RETICCTPCT in the last 72 hours. Sepsis Labs: No results for input(s): PROCALCITON, LATICACIDVEN in the last 168 hours.  No results found for this or any previous visit (from the past 240 hour(s)).       Radiology Studies: Dg Chest 2 View  Result Date: 01/14/2017 CLINICAL DATA:  Subacute onset of cough and shortness of breath. Generalized weakness and decreased appetite. Initial encounter. EXAM: CHEST  2 VIEW COMPARISON:  Chest radiograph from 09/28/2013 FINDINGS: The lungs are well-aerated. Bibasilar airspace opacities raise concern for pneumonia. There is no evidence of pleural effusion or pneumothorax. The heart is borderline normal in size. No acute osseous abnormalities are seen. Clips are noted within the right upper quadrant, reflecting prior cholecystectomy. IMPRESSION: Bibasilar pneumonia noted. Electronically Signed   By: Roanna Raider M.D.   On: 01/14/2017 20:11        Scheduled Meds: . aspirin EC  81 mg Oral Daily  . azithromycin  500 mg Intravenous Q24H  . brinzolamide  1 drop Both Eyes BID  . cefTRIAXone (ROCEPHIN)  IV  1 g Intravenous Q24H  .  feeding supplement (ENSURE ENLIVE)  237 mL Oral BID BM  . heparin  5,000 Units Subcutaneous Q8H  . latanoprost  1 drop Both Eyes QHS  . lisinopril  10 mg Oral Daily  . multivitamin with minerals  1 tablet Oral Daily  . timolol  1 drop Both Eyes QPC lunch   Continuous Infusions:   LOS: 1 day    Time spent: 35 mins     Keishia Ground Joline Maxcy, MD [facility] Pager [phone]   If 7PM-7AM, please contact night-coverage [email or web address] Password [facility] 01/15/2017, 12:44 PM

## 2017-01-15 NOTE — Progress Notes (Signed)
Initial Nutrition Assessment  DOCUMENTATION CODES:   Severe malnutrition in context of chronic illness  INTERVENTION:   Ensure Enlive po TID, each supplement provides 350 kcal and 20 grams of protein  MVI  NUTRITION DIAGNOSIS:   Malnutrition (severe) related to other (see comment) (advanced age), pneumonia as evidenced by 16 percent weight loss in 6 months, severe depletion of muscle mass over entire body, moderate depletion of body fat in upper arms and chest.  GOAL:   Patient will meet greater than or equal to 90% of their needs  MONITOR:   PO intake, Supplement acceptance, Labs, Weight trends  REASON FOR ASSESSMENT:   Malnutrition Screening Tool    ASSESSMENT:   83 y.o. female with medical history significant of past medical history of hypertension who comes in today PCPs office for cough and shortness of breath that started 2 weeks prior to admission. Found to have CAP   Met with pt in room today. Pt reports slow decrease in appetite over the past year but reports no appetite for the past two weeks. Pt relates recent decreased appetite to pneumonia and increased amounts of phlegm. Pt reports that she weighed 96lbs six months ago; this means that pt has lost 15lbs(16%) in 6 months which is severe. Pt is currently eating 75-100% of meals and drinking one Ensure per day. Pt reports that she used to drink Ensure at home but had quit. RD discussed with pt the importance of adequate protein intake to preserve lean muscle mass. Pt is willing to drink Ensure at home again.   Medications reviewed and include: aspirin, azithromycin, ceftriaxone, heparin, MVI  Labs reviewed: Ca 8.3(L) Wbc- 18.9(H)  Nutrition-Focused physical exam completed. Findings are moderate fat depletion in upper arms and chest, severe muscle depletion over entire body, and no edema.   Diet Order:  Diet regular Room service appropriate? Yes; Fluid consistency: Thin  Skin:  Reviewed, no issues  Last BM:   3/29  Height:   Ht Readings from Last 1 Encounters:  01/14/17 5' 1"  (1.549 m)    Weight:   Wt Readings from Last 1 Encounters:  01/14/17 81 lb (36.7 kg)    Ideal Body Weight:  47.7 kg  BMI:  Body mass index is 15.3 kg/m.  Estimated Nutritional Needs:   Kcal:  1200-1400kcal/day   Protein:  55-63g/day   Fluid:  >1.2L/day   EDUCATION NEEDS:   No education needs identified at this time  Koleen Distance, RD, LDN Pager #[phone]

## 2017-01-15 NOTE — Evaluation (Signed)
Physical Therapy One Time Evaluation Patient Details Name: Julia Hampton MRN: [id number] DOB: Feb 07, 1930 Today's Date: 01/15/2017   History of Present Illness  83 y.o. female with past medical history of hypertension and L hip hemiarthroplasty and admitted for CAP.  Clinical Impression  Patient evaluated by Physical Therapy with no further acute PT needs identified. All education has been completed and the patient has no further questions.  Pt mobilizing well.  Pt reports not quite at her baseline however close.  Pt typically very involved in community, works/volunteers at [employer] and very eager to return.  Encouarged pt to slowly progress her activity as she is very independent typically. See below for any follow-up Physical Therapy or equipment needs. PT is signing off. Thank you for this referral.     Follow Up Recommendations No PT follow up    Equipment Recommendations  None recommended by PT    Recommendations for Other Services       Precautions / Restrictions Precautions Precautions: None      Mobility  Bed Mobility Overal bed mobility: Modified Independent                Transfers Overall transfer level: Modified independent                  Ambulation/Gait Ambulation/Gait assistance: Supervision;Modified independent (Device/Increase time) Ambulation Distance (Feet): 400 Feet Assistive device: None Gait Pattern/deviations: Step-through pattern;Decreased stride length     General Gait Details: slow but steady pace, pt reports not her baseline but close  Stairs            Wheelchair Mobility    Modified Rankin (Stroke Patients Only)       Balance Overall balance assessment: Modified Independent (no significant deficits observed, denies recent falls)                                           Pertinent Vitals/Pain Pain Assessment: No/denies pain    Home Living Family/patient expects to be discharged to::  Private residence Living Arrangements: Children (daughter)   Type of Home: House       Home Layout: One level Home Equipment: None      Prior Function Level of Independence: Independent         Comments: frequently working at [employer]        Extremity/Trunk Assessment        Lower Extremity Assessment Lower Extremity Assessment: Generalized weakness    Cervical / Trunk Assessment Cervical / Trunk Assessment: Kyphotic  Communication   Communication: No difficulties  Cognition Arousal/Alertness: Awake/alert Behavior During Therapy: WFL for tasks assessed/performed Overall Cognitive Status: Within Functional Limits for tasks assessed                                        General Comments      Exercises     Assessment/Plan    PT Assessment Patent does not need any further PT services  PT Problem List         PT Treatment Interventions      PT Goals (Current goals can be found in the Care Plan section)  Acute Rehab PT Goals PT Goal Formulation: All assessment and education complete, DC therapy  Frequency     Barriers to discharge        Co-evaluation               End of Session Equipment Utilized During Treatment: Gait belt Activity Tolerance: Patient tolerated treatment well Patient left: in chair;with call bell/phone within reach;with chair alarm set Nurse Communication: Mobility status PT Visit Diagnosis: Difficulty in walking, not elsewhere classified (R26.2)    Time: 1308-6578 PT Time Calculation (min) (ACUTE ONLY): 17 min   Charges:   PT Evaluation $PT Eval Low Complexity: 1 Procedure     PT G CodesZenovia Jarred, PT, DPT 01/15/2017 Pager: [phone]   Maida Sale E 01/15/2017, 12:10 PM

## 2017-01-16 DIAGNOSIS — R636 Underweight: Secondary | ICD-10-CM | POA: Insufficient documentation

## 2017-01-16 DIAGNOSIS — E43 Unspecified severe protein-calorie malnutrition: Secondary | ICD-10-CM | POA: Insufficient documentation

## 2017-01-16 LAB — CBC WITH DIFFERENTIAL/PLATELET
Basophils Absolute: 0 K/uL (ref 0.0–0.1)
Basophils Relative: 0 %
Eosinophils Absolute: 0.3 K/uL (ref 0.0–0.7)
Eosinophils Relative: 2 %
HCT: 32.1 % — ABNORMAL LOW (ref 36.0–46.0)
Hemoglobin: 10.9 g/dL — ABNORMAL LOW (ref 12.0–15.0)
Lymphocytes Relative: 9 %
Lymphs Abs: 1.2 K/uL (ref 0.7–4.0)
MCH: 30.9 pg (ref 26.0–34.0)
MCHC: 34 g/dL (ref 30.0–36.0)
MCV: 90.9 fL (ref 78.0–100.0)
Monocytes Absolute: 0.9 K/uL (ref 0.1–1.0)
Monocytes Relative: 7 %
Neutro Abs: 11 K/uL — ABNORMAL HIGH (ref 1.7–7.7)
Neutrophils Relative %: 82 %
Platelets: 561 K/uL — ABNORMAL HIGH (ref 150–400)
RBC: 3.53 MIL/uL — ABNORMAL LOW (ref 3.87–5.11)
RDW: 12.9 % (ref 11.5–15.5)
WBC: 13.4 K/uL — ABNORMAL HIGH (ref 4.0–10.5)

## 2017-01-16 MED ORDER — GUAIFENESIN ER 600 MG PO TB12
600.0000 mg | ORAL_TABLET | Freq: Two times a day (BID) | ORAL | Status: DC
  Administered 2017-01-16 – 2017-01-17 (×3): 600 mg via ORAL
  Filled 2017-01-16 (×3): qty 1

## 2017-01-16 NOTE — Progress Notes (Signed)
PROGRESS NOTE    Julia Hampton  ZOX:[id number] DOB: Feb 05, 1930 DOA: 01/14/2017 PCP: Redmond Baseman, MD   Brief Narrative:  83 year old female with past medical history of hypertension was admitted yesterday with complaints of cough and shortness of breath which had been ongoing for about 2 weeks. She went to go see her PCP and was noted to have multifocal pneumonia therefore referred for admission to the hospital. Upon admission there was suspicion for possible community-acquired pneumonia therefore started on ceftriaxone and azithromycin.   Assessment & Plan:   Mild shortness of breath and productive cough/possible community-acquired pneumonia -Sputum culture has been ordered, blood cultures sent -Continue ceftriaxone and azithromycin. -Urine strep is negative at this time. -We will add DuoNeb as needed and incentive spirometry -Antitussin medication blood cultures negative for 48 hours.  Hypertension -Continue aspirin and lisinopril at this time  Generalized weakness. Definitely better after initiation of antibiotics. PT recommends no follow-up.  DVT prophylaxis:  Heparin subcutaneous Code Status: Full Family Communication:  No family at bedside Disposition Plan: Continue inpatient stay. Discharge tomorrow  Consultants:   None  Procedures:   None  Antimicrobials:    Rocephin and azithromycin  Subjective: Appetite improving, feeling better, still has shortness of breath and cough.  Objective: Vitals:   01/16/17 0451 01/16/17 0800 01/16/17 1439 01/16/17 1700  BP: 107/66 135/69 (!) 149/72 (!) 141/60  Pulse: 76 91 (!) 110 (!) 108  Resp: Temp: 98.6 F (37 C) 97.9 F (36.6 C) 99.7 F (37.6 C) 99.7 F (37.6 C)  TempSrc: Oral Oral Oral Oral  SpO2: 99% 97% 95% 97%  Weight:      Height:        Intake/Output Summary (Last 24 hours) at 01/16/17 1803 Last data filed at 01/16/17 1439  Gross per 24 hour  Intake              960 ml  Output               700 ml  Net              260 ml   Filed Weights   01/14/17 1422  Weight: 36.7 kg (81 lb)    Examination:  General exam: Appears calm and comfortable  Respiratory system: Bibasilar crackles but no wheezing. Cardiovascular system: S1 & S2 heard, RRR. No JVD, + systolic murmurs, rubs, gallops or clicks. No pedal edema. Gastrointestinal system: Abdomen is nondistended, soft and nontender. No organomegaly or masses felt. Normal bowel sounds heard. Central nervous system: Alert and oriented. No focal neurological deficits. Extremities: Symmetric 5 x 5 power. Skin: No rashes, lesions or ulcers Psychiatry: Judgement and insight appear normal. Mood & affect appropriate.     Data Reviewed:   CBC:  Recent Labs Lab 01/14/17 1557 01/15/17 0354 01/16/17 0848  WBC 23.4* 18.9* 13.4*  NEUTROABS  --   --  11.0*  HGB 11.8* 10.2* 10.9*  HCT 36.1 30.7* 32.1*  MCV 90.5 91.1 90.9  PLT 516* 519* 561*   Basic Metabolic Panel:  Recent Labs Lab 01/14/17 1557 01/15/17 0354  NA  --  135  K  --  3.6  CL  --  102  CO2  --  26  GLUCOSE  --  103*  BUN  --  12  CREATININE 0.54 0.46  CALCIUM  --  8.3*   GFR: Estimated Creatinine Clearance: 29.8 mL/min (by C-G formula based on SCr of 0.46 mg/dL). Liver Function Tests: No results  for input(s): AST, ALT, ALKPHOS, BILITOT, PROT, ALBUMIN in the last 168 hours. No results for input(s): LIPASE, AMYLASE in the last 168 hours. No results for input(s): AMMONIA in the last 168 hours. Coagulation Profile: No results for input(s): INR, PROTIME in the last 168 hours. Cardiac Enzymes: No results for input(s): CKTOTAL, CKMB, CKMBINDEX, TROPONINI in the last 168 hours. BNP (last 3 results) No results for input(s): PROBNP in the last 8760 hours. HbA1C: No results for input(s): HGBA1C in the last 72 hours. CBG: No results for input(s): GLUCAP in the last 168 hours. Lipid Profile: No results for input(s): CHOL, HDL, LDLCALC, TRIG, CHOLHDL,  LDLDIRECT in the last 72 hours. Thyroid Function Tests: No results for input(s): TSH, T4TOTAL, FREET4, T3FREE, THYROIDAB in the last 72 hours. Anemia Panel: No results for input(s): VITAMINB12, FOLATE, FERRITIN, TIBC, IRON, RETICCTPCT in the last 72 hours. Sepsis Labs: No results for input(s): PROCALCITON, LATICACIDVEN in the last 168 hours.  Recent Results (from the past 240 hour(s))  Culture, blood (routine x 2) Call MD if unable to obtain prior to antibiotics being given     Status: None (Preliminary result)   Collection Time: 01/14/17  3:50 PM  Result Value Ref Range Status   Specimen Description BLOOD LEFT ANTECUBITAL  Final   Special Requests IN PEDIATRIC BOTTLE 3CC  Final   Culture   Final    NO GROWTH 2 DAYS Performed at [facility], [address], [place], [place] [address]    Report Status PENDING  Incomplete  Culture, blood (routine x 2) Call MD if unable to obtain prior to antibiotics being given     Status: None (Preliminary result)   Collection Time: 01/14/17  4:00 PM  Result Value Ref Range Status   Specimen Description BLOOD LEFT ARM  Final   Special Requests IN PEDIATRIC BOTTLE 2CC  Final   Culture   Final    NO GROWTH 2 DAYS Performed at [facility], [address], [place], [place] [address]    Report Status PENDING  Incomplete         Radiology Studies: Dg Chest 2 View  Result Date: 01/14/2017 CLINICAL DATA:  Subacute onset of cough and shortness of breath. Generalized weakness and decreased appetite. Initial encounter. EXAM: CHEST  2 VIEW COMPARISON:  Chest radiograph from 09/28/2013 FINDINGS: The lungs are well-aerated. Bibasilar airspace opacities raise concern for pneumonia. There is no evidence of pleural effusion or pneumothorax. The heart is borderline normal in size. No acute osseous abnormalities are seen. Clips are noted within the right upper quadrant, reflecting prior cholecystectomy. IMPRESSION: Bibasilar pneumonia noted.  Electronically Signed   By: Roanna Raider M.D.   On: 01/14/2017 20:11        Scheduled Meds: . aspirin EC  81 mg Oral Daily  . azithromycin  500 mg Intravenous Q24H  . brinzolamide  1 drop Both Eyes BID  . cefTRIAXone (ROCEPHIN)  IV  1 g Intravenous Q24H  . feeding supplement (ENSURE ENLIVE)  237 mL Oral TID BM  . guaiFENesin  600 mg Oral BID  . heparin  5,000 Units Subcutaneous Q8H  . latanoprost  1 drop Both Eyes QHS  . multivitamin with minerals  1 tablet Oral Daily  . polycarbophil  1,250 mg Oral Daily  . timolol  1 drop Both Eyes QPC lunch   Continuous Infusions:   LOS: 2 days    Time spent: 30 mins  Author:  Lynden Oxford, MD [facility]  Pager: [phone] 01/16/2017 6:06 PM     If 7PM-7AM, please contact night-coverage [email or web address] Password [facility] 01/16/2017, 6:03 PM

## 2017-01-17 LAB — CBC
HEMATOCRIT: 31.6 % — AB (ref 36.0–46.0)
Hemoglobin: 10.2 g/dL — ABNORMAL LOW (ref 12.0–15.0)
MCH: 30.1 pg (ref 26.0–34.0)
MCHC: 32.3 g/dL (ref 30.0–36.0)
MCV: 93.2 fL (ref 78.0–100.0)
Platelets: 531 10*3/uL — ABNORMAL HIGH (ref 150–400)
RBC: 3.39 MIL/uL — ABNORMAL LOW (ref 3.87–5.11)
RDW: 13 % (ref 11.5–15.5)
WBC: 18.5 10*3/uL — AB (ref 4.0–10.5)

## 2017-01-17 LAB — BASIC METABOLIC PANEL
ANION GAP: 7 (ref 5–15)
BUN: 14 mg/dL (ref 6–20)
CALCIUM: 8.7 mg/dL — AB (ref 8.9–10.3)
CO2: 28 mmol/L (ref 22–32)
Chloride: 100 mmol/L — ABNORMAL LOW (ref 101–111)
Creatinine, Ser: 0.54 mg/dL (ref 0.44–1.00)
GFR calc Af Amer: >60 mL/min (ref >60)
GLUCOSE: 105 mg/dL — AB (ref 65–99)
POTASSIUM: 4.2 mmol/L (ref 3.5–5.1)
Sodium: 135 mmol/L (ref 135–145)

## 2017-01-17 MED ORDER — ENSURE ENLIVE PO LIQD: 237.0000 mL | Freq: Three times a day (TID) | ORAL | 12 refills | Status: DC

## 2017-01-17 MED ORDER — POLYETHYLENE GLYCOL 3350 17 G PO PACK: 17.0000 g | PACK | Freq: Every day | ORAL | 0 refills | Status: DC | PRN

## 2017-01-17 MED ORDER — GUAIFENESIN ER 600 MG PO TB12: 600.0000 mg | ORAL_TABLET | Freq: Two times a day (BID) | ORAL | 0 refills | Status: DC

## 2017-01-17 MED ORDER — AMOXICILLIN-POT CLAVULANATE 500-125 MG PO TABS: 1.0000 | ORAL_TABLET | Freq: Three times a day (TID) | ORAL | 0 refills | Status: AC

## 2017-01-17 MED ORDER — SACCHAROMYCES BOULARDII 250 MG PO CAPS: 250.0000 mg | ORAL_CAPSULE | Freq: Two times a day (BID) | ORAL | 0 refills | Status: DC

## 2017-01-17 NOTE — Care Management Important Message (Signed)
Important Message  Patient Details  Name: Julia Hampton MRN: [id number] Date of Birth: 1930/04/30   Medicare Important Message Given:  Yes    Elliot Cousin, RN 01/17/2017, 10:37 AM

## 2017-01-19 LAB — CULTURE, BLOOD (ROUTINE X 2)
CULTURE: NO GROWTH
CULTURE: NO GROWTH

## 2017-01-19 NOTE — Discharge Summary (Signed)
[facility] Discharge Summary   Patient: Julia Hampton ZOX:[id number]   PCP: Redmond Baseman, MD DOB: [birth year]   Date of admission: 01/14/2017   Date of discharge: 01/17/2017    Discharge Diagnoses:  Active Problems:   HTN (hypertension)   CAP (community acquired pneumonia)   Protein-calorie malnutrition, severe   Admitted From: home Disposition:  home  Recommendations for Outpatient Follow-up:  1. Follow-up with PCP in one   Follow-up Information    Redmond Baseman, MD. Schedule an appointment as soon as possible for a visit in 1 week(s).   Specialty:  Family Medicine Contact information: [address] [place] [address] [phone]          Diet recommendation: Cardiac diet  Activity: The patient is advised to gradually reintroduce usual activities.  Discharge Condition: good  Code Status: Full code  History of present illness: As per the H and P dictated on admission, " Julia Hampton is a 83 y.o. female with medical history significant of past medical history of hypertension who comes in today PCPs office for cough and shortness of breath that started 2 weeks prior to admission, she relates she been getting progressively short of breath with a productive cough she has been sleeping in a recliner for the last 2 days due to the cough, with generalized weakness decreased appetite she denies any fever, nausea, vomiting, diarrhea she denies any chest pain. An x-ray was done at the PCPs office that show multifocal pneumonias were consulted for direct admission and further evaluation."  Hospital Course:  Summary of her active problems in the hospital is as following. community-acquired pneumonia -Sputum culture unavailable, blood culture unremarkable. Treated with ceftriaxone and azithromycin. I would change to oral Augmentin -Urine strep is negative at this time. Continue incentive spirometry -Antitussin medication  Hypertension -Continue  aspirin and lisinopril at this time  Generalized weakness. Definitely better after initiation of antibiotics. PT recommends no follow-up.  All other chronic medical condition were stable during the hospitalization.  Patient was ambulatory without any assistance. Was seen by physical therapy, who recommended no therapy On the day of the discharge the patient's vitals were stable, and no other acute medical condition were reported by patient. the patient was felt safe to be discharge at home with family.  Procedures and Results:  none   Consultations:  none  DISCHARGE MEDICATION: Discharge Medication List as of 01/17/2017 11:05 AM    START taking these medications   Details  amoxicillin-clavulanate (AUGMENTIN) 500-125 MG tablet Take 1 tablet (500 mg total) by mouth 3 (three) times daily., Starting Sun 01/17/2017, Until Fri 01/22/2017, Normal    feeding supplement, ENSURE ENLIVE, (ENSURE ENLIVE) LIQD Take 237 mLs by mouth 3 (three) times daily between meals., Starting Sun 01/17/2017, Normal    guaiFENesin (MUCINEX) 600 MG 12 hr tablet Take 1 tablet (600 mg total) by mouth 2 (two) times daily., Starting Sun 01/17/2017, Normal    polyethylene glycol (MIRALAX / GLYCOLAX) packet Take 17 g by mouth daily as needed for mild constipation., Starting Sun 01/17/2017, Normal    saccharomyces boulardii (FLORASTOR) 250 MG capsule Take 1 capsule (250 mg total) by mouth 2 (two) times daily., Starting Sun 01/17/2017, Normal      CONTINUE these medications which have NOT CHANGED   Details  acetaminophen (TYLENOL) 500 MG tablet Take 500 mg by mouth every 6 (six) hours as needed., Historical Med    Ascorbic Acid (VITAMIN C PO) Take by mouth., Historical Med  aspirin EC 81 MG tablet Take 81 mg by mouth daily., Historical Med    bimatoprost (LUMIGAN) 0.01 % SOLN Place 1 drop into both eyes at bedtime., Historical Med    brinzolamide (AZOPT) 1 % ophthalmic suspension Place 1 drop into both eyes 2 (two) times  daily. , Historical Med    Multiple Vitamin (MULTIVITAMIN WITH MINERALS) TABS tablet Take 1 tablet by mouth daily., Historical Med    timolol (TIMOPTIC) 0.5 % ophthalmic solution Place 1 drop into both eyes daily after lunch., Historical Med      STOP taking these medications     lisinopril (PRINIVIL,ZESTRIL) 10 MG tablet        Allergies  Allergen Reactions  . Other Other (See Comments)    Glaucoma drop with a dark green top - not sure of name -   Discharge Instructions    Diet - low sodium heart healthy    Complete by:  As directed    Discharge instructions    Complete by:  As directed    It is important that you read following instructions as well as go over your medication list with RN to help you understand your care after this hospitalization.  Discharge Instructions: Please follow-up with PCP in one week  Please request your primary care physician to go over all Hospital Tests and Procedure/Radiological results at the follow up,  Please get all Hospital records sent to your PCP by signing hospital release before you go home.   Do not take more than prescribed Pain, Sleep and Anxiety Medications. You were cared for by a hospitalist during your hospital stay. If you have any questions about your discharge medications or the care you received while you were in the hospital after you are discharged, you can call the unit and ask to speak with the hospitalist on call if the hospitalist that took care of you is not available.  Once you are discharged, your primary care physician will handle any further medical issues. Please note that NO REFILLS for any discharge medications will be authorized once you are discharged, as it is imperative that you return to your primary care physician (or establish a relationship with a primary care physician if you do not have one) for your aftercare needs so that they can reassess your need for medications and monitor your lab values. You Must  read complete instructions/literature along with all the possible adverse reactions/side effects for all the Medicines you take and that have been prescribed to you. Take any new Medicines after you have completely understood and accept all the possible adverse reactions/side effects. Wear Seat belts while driving. If you have smoked or chewed Tobacco in the last 2 yrs please stop smoking and/or stop any Recreational drug use.   Increase activity slowly    Complete by:  As directed      Discharge Exam: Filed Weights   01/14/17 1422  Weight: 36.7 kg (81 lb)   Vitals:   01/16/17 2101 01/17/17 0536  BP: 139/75 118/67  Pulse: (!) 102 94  Resp: 18 17  Temp: 98.4 F (36.9 C) 97.6 F (36.4 C)   General: Appear in no distress, no Rash; Oral Mucosa moist. Cardiovascular: S1 and S2 Present, aortic systolic Murmur, no JVD Respiratory: Bilateral Air entry present and bilateralCrackles, no wheezes Abdomen: Bowel Sound present, Soft and no tenderness Extremities: no Pedal edema, no calf tenderness Neurology: Grossly no focal neuro deficit.  The results of significant diagnostics from this hospitalization (including  imaging, microbiology, ancillary and laboratory) are listed below for reference.    Significant Diagnostic Studies: Dg Chest 2 View  Result Date: 01/14/2017 CLINICAL DATA:  Subacute onset of cough and shortness of breath. Generalized weakness and decreased appetite. Initial encounter. EXAM: CHEST  2 VIEW COMPARISON:  Chest radiograph from 09/28/2013 FINDINGS: The lungs are well-aerated. Bibasilar airspace opacities raise concern for pneumonia. There is no evidence of pleural effusion or pneumothorax. The heart is borderline normal in size. No acute osseous abnormalities are seen. Clips are noted within the right upper quadrant, reflecting prior cholecystectomy. IMPRESSION: Bibasilar pneumonia noted. Electronically Signed   By: Roanna Raider M.D.   On: 01/14/2017 20:11     Microbiology: Recent Results (from the past 240 hour(s))  Culture, blood (routine x 2) Call MD if unable to obtain prior to antibiotics being given     Status: None   Collection Time: 01/14/17  3:50 PM  Result Value Ref Range Status   Specimen Description BLOOD LEFT ANTECUBITAL  Final   Special Requests IN PEDIATRIC BOTTLE 3CC  Final   Culture   Final    NO GROWTH 5 DAYS Performed at [facility], [address], [place], [place] [address]    Report Status 01/19/2017 FINAL  Final  Culture, blood (routine x 2) Call MD if unable to obtain prior to antibiotics being given     Status: None   Collection Time: 01/14/17  4:00 PM  Result Value Ref Range Status   Specimen Description BLOOD LEFT ARM  Final   Special Requests IN PEDIATRIC BOTTLE 2CC  Final   Culture   Final    NO GROWTH 5 DAYS Performed at [facility], [address], [place], [place] [address]    Report Status 01/19/2017 FINAL  Final     Labs: CBC:  Recent Labs Lab 01/14/17 1557 01/15/17 0354 01/16/17 0848 01/17/17 0353  WBC 23.4* 18.9* 13.4* 18.5*  NEUTROABS  --   --  11.0*  --   HGB 11.8* 10.2* 10.9* 10.2*  HCT 36.1 30.7* 32.1* 31.6*  MCV 90.5 91.1 90.9 93.2  PLT 516* 519* 561* 531*   Basic Metabolic Panel:  Recent Labs Lab 01/14/17 1557 01/15/17 0354 01/17/17 0353  NA  --  135 135  K  --  3.6 4.2  CL  --  102 100*  CO2  --  26 28  GLUCOSE  --  103* 105*  BUN  --  12 14  CREATININE 0.54 0.46 0.54  CALCIUM  --  8.3* 8.7*   Liver Function Tests: No results for input(s): AST, ALT, ALKPHOS, BILITOT, PROT, ALBUMIN in the last 168 hours. No results for input(s): LIPASE, AMYLASE in the last 168 hours. No results for input(s): AMMONIA in the last 168 hours. Cardiac Enzymes: No results for input(s): CKTOTAL, CKMB, CKMBINDEX, TROPONINI in the last 168 hours. BNP (last 3 results)  Recent Labs  01/15/17 1333  BNP 161.7*   CBG: No results for input(s): GLUCAP in the last 168  hours. Time spent: 30 minutes  Signed:  Lynden Oxford  [facility] 01/17/2017  5:34 PM

## 2017-01-22 ENCOUNTER — Emergency Department (HOSPITAL_COMMUNITY)
Admission: EM | Admit: 2017-01-22 | Discharge: 2017-01-22 | Disposition: A | Discharge status: Home / Self Care | Payer: Medicare Other | Attending: Emergency Medicine | Admitting: Emergency Medicine

## 2017-01-22 ENCOUNTER — Encounter (HOSPITAL_COMMUNITY): Payer: Self-pay | Admitting: *Deleted

## 2017-01-22 ENCOUNTER — Emergency Department (HOSPITAL_COMMUNITY): Payer: Medicare Other

## 2017-01-22 DIAGNOSIS — R531 Weakness: Secondary | ICD-10-CM | POA: Diagnosis present

## 2017-01-22 DIAGNOSIS — R197 Diarrhea, unspecified: Secondary | ICD-10-CM | POA: Insufficient documentation

## 2017-01-22 DIAGNOSIS — Z96642 Presence of left artificial hip joint: Secondary | ICD-10-CM | POA: Insufficient documentation

## 2017-01-22 DIAGNOSIS — E860 Dehydration: Secondary | ICD-10-CM | POA: Insufficient documentation

## 2017-01-22 DIAGNOSIS — E871 Hypo-osmolality and hyponatremia: Secondary | ICD-10-CM | POA: Diagnosis not present

## 2017-01-22 DIAGNOSIS — I10 Essential (primary) hypertension: Secondary | ICD-10-CM | POA: Diagnosis not present

## 2017-01-22 DIAGNOSIS — Z7982 Long term (current) use of aspirin: Secondary | ICD-10-CM | POA: Diagnosis not present

## 2017-01-22 DIAGNOSIS — R0602 Shortness of breath: Secondary | ICD-10-CM | POA: Diagnosis not present

## 2017-01-22 DIAGNOSIS — Z79899 Other long term (current) drug therapy: Secondary | ICD-10-CM | POA: Diagnosis not present

## 2017-01-22 LAB — BASIC METABOLIC PANEL
Anion gap: 7 (ref 5–15)
BUN: 17 mg/dL (ref 6–20)
CALCIUM: 9.1 mg/dL (ref 8.9–10.3)
CHLORIDE: 96 mmol/L — AB (ref 101–111)
CO2: 26 mmol/L (ref 22–32)
CREATININE: 0.67 mg/dL (ref 0.44–1.00)
GFR calc non Af Amer: >60 mL/min (ref >60)
Glucose, Bld: 121 mg/dL — ABNORMAL HIGH (ref 65–99)
Potassium: 4.5 mmol/L (ref 3.5–5.1)
Sodium: 129 mmol/L — ABNORMAL LOW (ref 135–145)

## 2017-01-22 LAB — CBC WITH DIFFERENTIAL/PLATELET
BASOS PCT: 0 %
Basophils Absolute: 0.1 10*3/uL (ref 0.0–0.1)
Eosinophils Absolute: 0.1 10*3/uL (ref 0.0–0.7)
Eosinophils Relative: 1 %
HCT: 36.5 % (ref 36.0–46.0)
HEMOGLOBIN: 11.7 g/dL — AB (ref 12.0–15.0)
LYMPHS PCT: 12 %
Lymphs Abs: 1.5 10*3/uL (ref 0.7–4.0)
MCH: 29.8 pg (ref 26.0–34.0)
MCHC: 32.1 g/dL (ref 30.0–36.0)
MCV: 93.1 fL (ref 78.0–100.0)
Monocytes Absolute: 1.4 10*3/uL — ABNORMAL HIGH (ref 0.1–1.0)
Monocytes Relative: 11 %
NEUTROS ABS: 9.9 10*3/uL — AB (ref 1.7–7.7)
Neutrophils Relative %: 76 %
Platelets: 660 10*3/uL — ABNORMAL HIGH (ref 150–400)
RBC: 3.92 MIL/uL (ref 3.87–5.11)
RDW: 13.2 % (ref 11.5–15.5)
WBC: 12.9 10*3/uL — AB (ref 4.0–10.5)

## 2017-01-22 LAB — BLOOD GAS, VENOUS
ACID-BASE EXCESS: 3.1 mmol/L — AB (ref 0.0–2.0)
BICARBONATE: 28.4 mmol/L — AB (ref 20.0–28.0)
O2 Saturation: 30.3 %
PATIENT TEMPERATURE: 98.6
pCO2, Ven: 47.9 mmHg (ref 44.0–60.0)
pH, Ven: 7.391 (ref 7.250–7.430)

## 2017-01-22 LAB — I-STAT TROPONIN, ED: Troponin i, poc: 0 ng/mL (ref 0.00–0.08)

## 2017-01-22 LAB — BRAIN NATRIURETIC PEPTIDE: B NATRIURETIC PEPTIDE 5: 22.6 pg/mL (ref 0.0–100.0)

## 2017-01-22 MED ORDER — BISMUTH SUBSALICYLATE 262 MG/15ML PO SUSP: 30.0000 mL | Freq: Four times a day (QID) | ORAL | 0 refills | Status: AC | PRN

## 2017-01-22 MED ORDER — CALCIUM POLYCARBOPHIL 625 MG PO TABS: 625.0000 mg | ORAL_TABLET | Freq: Every day | ORAL | 0 refills | Status: DC

## 2017-01-22 MED ORDER — SODIUM CHLORIDE 0.9 % IV BOLUS (SEPSIS)
1000.0000 mL | Freq: Once | INTRAVENOUS | Status: AC
  Administered 2017-01-22: 1000 mL via INTRAVENOUS

## 2017-01-22 NOTE — ED Provider Notes (Signed)
[facility]-EMERGENCY DEPT Provider Note   CSN: [id number] Arrival date & time: 01/22/17  1217     History   Chief Complaint Chief Complaint  Patient presents with  . Abnormal Lab    high K  . Shortness of Breath    HPI Julia Hampton is a 83 y.o. female.  The history is provided by the patient.  Abnormal Lab  Time since result:  Yesterday Patient referred by:  PCP Resulting agency:  External Resulting agency details:  6.2 Result type: chemistry   Chemistry:    Potassium:  High Weakness  Primary symptoms include no focal weakness. This is a new (since leaving hospital with loss of appetite and poor intake) problem. The current episode started 2 days ago. The problem has not changed since onset.There was no focality noted. There has been no fever. Associated symptoms include shortness of breath (with known pneumonia being treated with augmentin since dc). Pertinent negatives include no altered mental status and no confusion. Associated medical issues comments: recent admission for CAP.    Past Medical History:  Diagnosis Date  . Complication of anesthesia    trouble waking patient up  . Glaucoma     Patient Active Problem List   Diagnosis Date Noted  . Protein-calorie malnutrition, severe 01/16/2017  . CAP (community acquired pneumonia) 01/14/2017  . Unspecified constipation 09/30/2013  . Hip fracture (HCC) 09/28/2013  . HTN (hypertension) 09/28/2013  . UTI (urinary tract infection) 09/28/2013    Past Surgical History:  Procedure Laterality Date  . ABDOMINAL HYSTERECTOMY    . CHOLECYSTECTOMY    . HIP ARTHROPLASTY Left 09/28/2013   Procedure: ARTHROPLASTY BIPOLAR HIP;  Surgeon: Javier Docker, MD;  Location: [facility] ORS;  Service: Orthopedics;  Laterality: Left;  Marland Kitchen MASTOIDECTOMY      OB History    No data available       Home Medications    Prior to Admission medications   Medication Sig Start Date End Date Taking? Authorizing Provider  acetaminophen (TYLENOL)  500 MG tablet Take 500 mg by mouth every 6 (six) hours as needed.    Historical Provider, MD  amoxicillin-clavulanate (AUGMENTIN) 500-125 MG tablet Take 1 tablet (500 mg total) by mouth 3 (three) times daily. 01/17/17 01/22/17  Rolly Salter, MD  Ascorbic Acid (VITAMIN C PO) Take by mouth.    Historical Provider, MD  aspirin EC 81 MG tablet Take 81 mg by mouth daily.    Historical Provider, MD  bimatoprost (LUMIGAN) 0.01 % SOLN Place 1 drop into both eyes at bedtime.    Historical Provider, MD  bismuth subsalicylate (PEPTO-BISMOL) 262 MG/15ML suspension Take 30 mLs by mouth every 6 (six) hours as needed. 01/22/17   Lyndal Pulley, MD  brinzolamide (AZOPT) 1 % ophthalmic suspension Place 1 drop into both eyes 2 (two) times daily.     Historical Provider, MD  feeding supplement, ENSURE ENLIVE, (ENSURE ENLIVE) LIQD Take 237 mLs by mouth 3 (three) times daily between meals. 01/17/17   Rolly Salter, MD  guaiFENesin (MUCINEX) 600 MG 12 hr tablet Take 1 tablet (600 mg total) by mouth 2 (two) times daily. 01/17/17   Rolly Salter, MD  Multiple Vitamin (MULTIVITAMIN WITH MINERALS) TABS tablet Take 1 tablet by mouth daily.    Historical Provider, MD  polycarbophil (FIBERCON) 625 MG tablet Take 1 tablet (625 mg total) by mouth daily. 01/22/17   Lyndal Pulley, MD  polyethylene glycol [facility]) packet Take 17 g by mouth daily as  needed for mild constipation. 01/17/17   Rolly Salter, MD  saccharomyces boulardii (FLORASTOR) 250 MG capsule Take 1 capsule (250 mg total) by mouth 2 (two) times daily. 01/17/17   Rolly Salter, MD  timolol (TIMOPTIC) 0.5 % ophthalmic solution Place 1 drop into both eyes daily after lunch.    Historical Provider, MD    Family History Family History  Problem Relation Age of Onset  . Heart failure Mother   . Parkinson's disease Father     Social History Social History  Substance Use Topics  . Smoking status: Never Smoker  . Smokeless tobacco: Never Used  . Alcohol use No      Allergies   Other   Review of Systems Review of Systems  Respiratory: Positive for shortness of breath (with known pneumonia being treated with augmentin since dc).   Neurological: Positive for weakness. Negative for focal weakness.  Psychiatric/Behavioral: Negative for confusion.     Physical Exam Updated Vital Signs BP 121/63   Pulse 83   Temp 97.7 F (36.5 C) (Oral)   Resp 18   SpO2 99%   Physical Exam   ED Treatments / Results  Labs (all labs ordered are listed, but only abnormal results are displayed) Labs Reviewed  CBC WITH DIFFERENTIAL/PLATELET - Abnormal; Notable for the following:       Result Value   WBC 12.9 (*)    Hemoglobin 11.7 (*)    Platelets 660 (*)    Neutro Abs 9.9 (*)    Monocytes Absolute 1.4 (*)    All other components within normal limits  BASIC METABOLIC PANEL - Abnormal; Notable for the following:    Sodium 129 (*)    Chloride 96 (*)    Glucose, Bld 121 (*)    All other components within normal limits  BLOOD GAS, VENOUS - Abnormal; Notable for the following:    Bicarbonate 28.4 (*)    Acid-Base Excess 3.1 (*)    All other components within normal limits  BRAIN NATRIURETIC PEPTIDE  I-STAT TROPOININ, ED    EKG  EKG Interpretation  Date/Time:  Friday January 22 2017 12:31:11 EDT Ventricular Rate:  88 PR Interval:    QRS Duration: 78 QT Interval:  348 QTC Calculation: 421 R Axis:   -5 Text Interpretation:  Sinus rhythm LVH by voltage No significant change since last tracing Confirmed by Zahari Fazzino MD, Alpha Mysliwiec [phone]) on 01/22/2017 3:26:12 PM       Radiology Dg Chest 2 View  Result Date: 01/22/2017 CLINICAL DATA:  Shortness of breath for 3 weeks. Nonsmoker. History of hypertension. EXAM: CHEST  2 VIEW COMPARISON:  Radiographs 01/14/2017 and 09/28/2013. FINDINGS: There are persistent bibasilar airspace opacities suspicious for pneumonia. There has been partial clearing of the right lung base. On the lateral view, right lower lobe  airspace opacity appears more focal and weight shaped. Biapical scarring appears unchanged. There is no pleural effusion or pneumothorax. The heart size and mediastinal contours are stable. IMPRESSION: Persistent bibasilar airspace opacities most consistent with pneumonia. There has been partial clearing on the right. Assuming the patient has clinical evidence of pneumonia, an additional followup PA and lateral chest X-ray is recommended in 3-4 weeks following trial of antibiotic therapy to ensure resolution and exclude underlying malignancy. Electronically Signed   By: Carey Bullocks M.D.   On: 01/22/2017 12:54    Procedures Procedures (including critical care time)  Medications Ordered in ED Medications  sodium chloride 0.9 % bolus 1,000 mL (1,000 mLs Intravenous  New Bag/Given 01/22/17 1446)     Initial Impression / Assessment and Plan / ED Course  I have reviewed the triage vital signs and the nursing notes.  Pertinent labs & imaging results that were available during my care of the patient were reviewed by me and considered in my medical decision making (see chart for details).     83 year old female presents after her potassium was found to be 6.2 and an outside lab value after recent admission for community acquired pneumonia.  On redraw here the patient's potassium is normal so I suspect lab error as the cause. Notably the patient does have a hyponatremic hypochloremic dehydration. She admits to poor by mouth intake since leaving and having some mild diarrhea with Augmentin which is likely secondary to clauvalanic acid. Treatment to this point appears appropriate as patient is afebrile with stable vital signs and has downtrending white blood cell count from 18 to 12. Plan for providing a bolus of IV fluid here, starting this results and fiber for diarrheal symptoms at home and close follow-up with primary care physician to recheck at a later date.  Plan to follow up with PCP as needed and  return precautions discussed for worsening or new concerning symptoms.   Final Clinical Impressions(s) / ED Diagnoses   Final diagnoses:  Dehydration  Hyponatremia  Diarrhea, unspecified type    New Prescriptions New Prescriptions   BISMUTH SUBSALICYLATE (PEPTO-BISMOL) 262 MG/15ML SUSPENSION    Take 30 mLs by mouth every 6 (six) hours as needed.   POLYCARBOPHIL (FIBERCON) 625 MG TABLET    Take 1 tablet (625 mg total) by mouth daily.     Lyndal Pulley, MD 01/22/17 [phone]

## 2017-01-22 NOTE — ED Triage Notes (Addendum)
Pt sent by her PCP due to elevated potassium. Pt was dc'd from hospital 5 days ago for pneumonia. Pt states she has shortness of breath when walking and laying down flat and has difficulty eating. Pt denies chest pain.    PCP reports pt's potassium was 6.2 and saw fluid on lungs from x-ray performed yesterday.

## 2017-01-22 NOTE — ED Notes (Signed)
Patient transported to X-ray 

## 2017-01-22 NOTE — ED Notes (Signed)
Discharge instructions, follow up care, and rx x2 reviewed with patient. Patient verbalized understanding. 

## 2018-11-15 ENCOUNTER — Ambulatory Visit (HOSPITAL_BASED_OUTPATIENT_CLINIC_OR_DEPARTMENT_OTHER)
Admission: RE | Admit: 2018-11-15 | Discharge: 2018-11-15 | Disposition: A | Discharge status: Home / Self Care | Payer: Medicare Other | Source: Ambulatory Visit | Attending: Family Medicine | Admitting: Family Medicine

## 2018-11-15 ENCOUNTER — Other Ambulatory Visit (HOSPITAL_BASED_OUTPATIENT_CLINIC_OR_DEPARTMENT_OTHER): Payer: Self-pay | Admitting: Family Medicine

## 2018-11-15 DIAGNOSIS — R609 Edema, unspecified: Secondary | ICD-10-CM | POA: Insufficient documentation

## 2018-11-15 DIAGNOSIS — R52 Pain, unspecified: Secondary | ICD-10-CM

## 2020-12-23 DIAGNOSIS — H401122 Primary open-angle glaucoma, left eye, moderate stage: Secondary | ICD-10-CM | POA: Diagnosis not present

## 2020-12-23 DIAGNOSIS — H401113 Primary open-angle glaucoma, right eye, severe stage: Secondary | ICD-10-CM | POA: Diagnosis not present

## 2020-12-31 DIAGNOSIS — J340 Abscess, furuncle and carbuncle of nose: Secondary | ICD-10-CM | POA: Diagnosis not present

## 2020-12-31 DIAGNOSIS — R1319 Other dysphagia: Secondary | ICD-10-CM | POA: Diagnosis not present

## 2021-01-03 ENCOUNTER — Other Ambulatory Visit: Payer: Self-pay | Admitting: Gastroenterology

## 2021-01-03 DIAGNOSIS — R1310 Dysphagia, unspecified: Secondary | ICD-10-CM | POA: Diagnosis not present

## 2021-01-03 DIAGNOSIS — Z800 Family history of malignant neoplasm of digestive organs: Secondary | ICD-10-CM | POA: Diagnosis not present

## 2021-01-03 DIAGNOSIS — K5900 Constipation, unspecified: Secondary | ICD-10-CM | POA: Diagnosis not present

## 2021-01-03 DIAGNOSIS — R1013 Epigastric pain: Secondary | ICD-10-CM | POA: Diagnosis not present

## 2021-01-06 DIAGNOSIS — D485 Neoplasm of uncertain behavior of skin: Secondary | ICD-10-CM | POA: Diagnosis not present

## 2021-01-06 DIAGNOSIS — L821 Other seborrheic keratosis: Secondary | ICD-10-CM | POA: Diagnosis not present

## 2021-01-06 DIAGNOSIS — C44311 Basal cell carcinoma of skin of nose: Secondary | ICD-10-CM | POA: Diagnosis not present

## 2021-01-06 DIAGNOSIS — D229 Melanocytic nevi, unspecified: Secondary | ICD-10-CM | POA: Diagnosis not present

## 2021-01-09 DIAGNOSIS — H401113 Primary open-angle glaucoma, right eye, severe stage: Secondary | ICD-10-CM | POA: Diagnosis not present

## 2021-01-09 DIAGNOSIS — H401122 Primary open-angle glaucoma, left eye, moderate stage: Secondary | ICD-10-CM | POA: Diagnosis not present

## 2021-02-05 ENCOUNTER — Ambulatory Visit
Admission: RE | Admit: 2021-02-05 | Discharge: 2021-02-05 | Disposition: A | Discharge status: Home / Self Care | Payer: Medicare Other | Source: Ambulatory Visit | Attending: Gastroenterology | Admitting: Gastroenterology

## 2021-02-05 DIAGNOSIS — K2289 Other specified disease of esophagus: Secondary | ICD-10-CM | POA: Diagnosis not present

## 2021-02-05 DIAGNOSIS — R1310 Dysphagia, unspecified: Secondary | ICD-10-CM

## 2021-02-12 DIAGNOSIS — C44311 Basal cell carcinoma of skin of nose: Secondary | ICD-10-CM | POA: Diagnosis not present

## 2021-02-24 DIAGNOSIS — R198 Other specified symptoms and signs involving the digestive system and abdomen: Secondary | ICD-10-CM | POA: Diagnosis not present

## 2021-02-24 DIAGNOSIS — R634 Abnormal weight loss: Secondary | ICD-10-CM | POA: Diagnosis not present

## 2021-02-24 DIAGNOSIS — K219 Gastro-esophageal reflux disease without esophagitis: Secondary | ICD-10-CM | POA: Diagnosis not present

## 2021-02-24 DIAGNOSIS — R1310 Dysphagia, unspecified: Secondary | ICD-10-CM | POA: Diagnosis not present

## 2021-03-12 DIAGNOSIS — H401113 Primary open-angle glaucoma, right eye, severe stage: Secondary | ICD-10-CM | POA: Diagnosis not present

## 2021-03-12 DIAGNOSIS — H401122 Primary open-angle glaucoma, left eye, moderate stage: Secondary | ICD-10-CM | POA: Diagnosis not present

## 2021-05-01 DIAGNOSIS — R42 Dizziness and giddiness: Secondary | ICD-10-CM | POA: Diagnosis not present

## 2021-05-01 DIAGNOSIS — R739 Hyperglycemia, unspecified: Secondary | ICD-10-CM | POA: Diagnosis not present

## 2021-05-01 DIAGNOSIS — I10 Essential (primary) hypertension: Secondary | ICD-10-CM | POA: Diagnosis not present

## 2021-05-01 DIAGNOSIS — K219 Gastro-esophageal reflux disease without esophagitis: Secondary | ICD-10-CM | POA: Diagnosis not present

## 2021-05-16 DIAGNOSIS — R6883 Chills (without fever): Secondary | ICD-10-CM | POA: Diagnosis not present

## 2021-05-16 DIAGNOSIS — Z681 Body mass index (BMI) 19 or less, adult: Secondary | ICD-10-CM | POA: Diagnosis not present

## 2021-06-12 DIAGNOSIS — H401122 Primary open-angle glaucoma, left eye, moderate stage: Secondary | ICD-10-CM | POA: Diagnosis not present

## 2021-06-12 DIAGNOSIS — H401113 Primary open-angle glaucoma, right eye, severe stage: Secondary | ICD-10-CM | POA: Diagnosis not present

## 2021-06-24 DIAGNOSIS — H52223 Regular astigmatism, bilateral: Secondary | ICD-10-CM | POA: Diagnosis not present

## 2021-06-24 DIAGNOSIS — Z9849 Cataract extraction status, unspecified eye: Secondary | ICD-10-CM | POA: Diagnosis not present

## 2021-06-24 DIAGNOSIS — Z961 Presence of intraocular lens: Secondary | ICD-10-CM | POA: Diagnosis not present

## 2021-06-24 DIAGNOSIS — H353131 Nonexudative age-related macular degeneration, bilateral, early dry stage: Secondary | ICD-10-CM | POA: Diagnosis not present

## 2021-06-24 DIAGNOSIS — H3530 Unspecified macular degeneration: Secondary | ICD-10-CM | POA: Diagnosis not present

## 2021-06-24 DIAGNOSIS — H5203 Hypermetropia, bilateral: Secondary | ICD-10-CM | POA: Diagnosis not present

## 2021-06-24 DIAGNOSIS — H524 Presbyopia: Secondary | ICD-10-CM | POA: Diagnosis not present

## 2021-08-03 DIAGNOSIS — K529 Noninfective gastroenteritis and colitis, unspecified: Secondary | ICD-10-CM | POA: Diagnosis not present

## 2021-08-03 DIAGNOSIS — J8410 Pulmonary fibrosis, unspecified: Secondary | ICD-10-CM | POA: Diagnosis not present

## 2021-08-03 DIAGNOSIS — Z79899 Other long term (current) drug therapy: Secondary | ICD-10-CM | POA: Diagnosis not present

## 2021-08-03 DIAGNOSIS — Z888 Allergy status to other drugs, medicaments and biological substances status: Secondary | ICD-10-CM | POA: Diagnosis not present

## 2021-08-03 DIAGNOSIS — M6281 Muscle weakness (generalized): Secondary | ICD-10-CM | POA: Diagnosis not present

## 2021-08-03 DIAGNOSIS — K5730 Diverticulosis of large intestine without perforation or abscess without bleeding: Secondary | ICD-10-CM | POA: Diagnosis not present

## 2021-08-03 DIAGNOSIS — I77810 Thoracic aortic ectasia: Secondary | ICD-10-CM | POA: Diagnosis not present

## 2021-08-03 DIAGNOSIS — Z9049 Acquired absence of other specified parts of digestive tract: Secondary | ICD-10-CM | POA: Diagnosis not present

## 2021-08-03 DIAGNOSIS — I2510 Atherosclerotic heart disease of native coronary artery without angina pectoris: Secondary | ICD-10-CM | POA: Diagnosis not present

## 2021-08-03 DIAGNOSIS — I10 Essential (primary) hypertension: Secondary | ICD-10-CM | POA: Diagnosis not present

## 2021-08-03 DIAGNOSIS — H401130 Primary open-angle glaucoma, bilateral, stage unspecified: Secondary | ICD-10-CM | POA: Diagnosis not present

## 2021-08-03 DIAGNOSIS — Z681 Body mass index (BMI) 19 or less, adult: Secondary | ICD-10-CM | POA: Diagnosis not present

## 2021-08-03 DIAGNOSIS — R918 Other nonspecific abnormal finding of lung field: Secondary | ICD-10-CM | POA: Diagnosis not present

## 2021-08-03 DIAGNOSIS — R112 Nausea with vomiting, unspecified: Secondary | ICD-10-CM | POA: Diagnosis not present

## 2021-08-03 DIAGNOSIS — R1084 Generalized abdominal pain: Secondary | ICD-10-CM | POA: Diagnosis not present

## 2021-08-03 DIAGNOSIS — N200 Calculus of kidney: Secondary | ICD-10-CM | POA: Diagnosis not present

## 2021-08-03 DIAGNOSIS — E43 Unspecified severe protein-calorie malnutrition: Secondary | ICD-10-CM | POA: Diagnosis not present

## 2021-08-04 DIAGNOSIS — K529 Noninfective gastroenteritis and colitis, unspecified: Secondary | ICD-10-CM | POA: Diagnosis not present

## 2021-08-05 DIAGNOSIS — E43 Unspecified severe protein-calorie malnutrition: Secondary | ICD-10-CM | POA: Diagnosis not present

## 2021-08-05 DIAGNOSIS — K5730 Diverticulosis of large intestine without perforation or abscess without bleeding: Secondary | ICD-10-CM | POA: Diagnosis not present

## 2021-08-05 DIAGNOSIS — K529 Noninfective gastroenteritis and colitis, unspecified: Secondary | ICD-10-CM | POA: Diagnosis not present

## 2021-08-05 DIAGNOSIS — H401130 Primary open-angle glaucoma, bilateral, stage unspecified: Secondary | ICD-10-CM | POA: Diagnosis not present

## 2021-08-05 DIAGNOSIS — I10 Essential (primary) hypertension: Secondary | ICD-10-CM | POA: Diagnosis not present

## 2021-08-05 DIAGNOSIS — E441 Mild protein-calorie malnutrition: Secondary | ICD-10-CM | POA: Diagnosis not present

## 2021-08-05 DIAGNOSIS — R918 Other nonspecific abnormal finding of lung field: Secondary | ICD-10-CM | POA: Diagnosis not present

## 2021-08-07 DIAGNOSIS — R918 Other nonspecific abnormal finding of lung field: Secondary | ICD-10-CM | POA: Diagnosis not present

## 2021-08-07 DIAGNOSIS — K5730 Diverticulosis of large intestine without perforation or abscess without bleeding: Secondary | ICD-10-CM | POA: Diagnosis not present

## 2021-08-07 DIAGNOSIS — H401130 Primary open-angle glaucoma, bilateral, stage unspecified: Secondary | ICD-10-CM | POA: Diagnosis not present

## 2021-08-07 DIAGNOSIS — E441 Mild protein-calorie malnutrition: Secondary | ICD-10-CM | POA: Diagnosis not present

## 2021-08-07 DIAGNOSIS — I10 Essential (primary) hypertension: Secondary | ICD-10-CM | POA: Diagnosis not present

## 2021-08-07 DIAGNOSIS — K529 Noninfective gastroenteritis and colitis, unspecified: Secondary | ICD-10-CM | POA: Diagnosis not present

## 2021-08-08 DIAGNOSIS — E441 Mild protein-calorie malnutrition: Secondary | ICD-10-CM | POA: Diagnosis not present

## 2021-08-08 DIAGNOSIS — I10 Essential (primary) hypertension: Secondary | ICD-10-CM | POA: Diagnosis not present

## 2021-08-08 DIAGNOSIS — K5730 Diverticulosis of large intestine without perforation or abscess without bleeding: Secondary | ICD-10-CM | POA: Diagnosis not present

## 2021-08-08 DIAGNOSIS — H401130 Primary open-angle glaucoma, bilateral, stage unspecified: Secondary | ICD-10-CM | POA: Diagnosis not present

## 2021-08-08 DIAGNOSIS — K529 Noninfective gastroenteritis and colitis, unspecified: Secondary | ICD-10-CM | POA: Diagnosis not present

## 2021-08-08 DIAGNOSIS — R918 Other nonspecific abnormal finding of lung field: Secondary | ICD-10-CM | POA: Diagnosis not present

## 2021-08-11 DIAGNOSIS — H401130 Primary open-angle glaucoma, bilateral, stage unspecified: Secondary | ICD-10-CM | POA: Diagnosis not present

## 2021-08-11 DIAGNOSIS — K5730 Diverticulosis of large intestine without perforation or abscess without bleeding: Secondary | ICD-10-CM | POA: Diagnosis not present

## 2021-08-11 DIAGNOSIS — R918 Other nonspecific abnormal finding of lung field: Secondary | ICD-10-CM | POA: Diagnosis not present

## 2021-08-11 DIAGNOSIS — E441 Mild protein-calorie malnutrition: Secondary | ICD-10-CM | POA: Diagnosis not present

## 2021-08-11 DIAGNOSIS — I10 Essential (primary) hypertension: Secondary | ICD-10-CM | POA: Diagnosis not present

## 2021-08-11 DIAGNOSIS — K529 Noninfective gastroenteritis and colitis, unspecified: Secondary | ICD-10-CM | POA: Diagnosis not present

## 2021-08-12 DIAGNOSIS — I10 Essential (primary) hypertension: Secondary | ICD-10-CM | POA: Diagnosis not present

## 2021-08-12 DIAGNOSIS — K529 Noninfective gastroenteritis and colitis, unspecified: Secondary | ICD-10-CM | POA: Diagnosis not present

## 2021-08-12 DIAGNOSIS — K219 Gastro-esophageal reflux disease without esophagitis: Secondary | ICD-10-CM | POA: Diagnosis not present

## 2021-08-12 DIAGNOSIS — E440 Moderate protein-calorie malnutrition: Secondary | ICD-10-CM | POA: Diagnosis not present

## 2021-08-12 DIAGNOSIS — R918 Other nonspecific abnormal finding of lung field: Secondary | ICD-10-CM | POA: Diagnosis not present

## 2021-08-14 DIAGNOSIS — R918 Other nonspecific abnormal finding of lung field: Secondary | ICD-10-CM | POA: Diagnosis not present

## 2021-08-14 DIAGNOSIS — K529 Noninfective gastroenteritis and colitis, unspecified: Secondary | ICD-10-CM | POA: Diagnosis not present

## 2021-08-14 DIAGNOSIS — E441 Mild protein-calorie malnutrition: Secondary | ICD-10-CM | POA: Diagnosis not present

## 2021-08-14 DIAGNOSIS — H401130 Primary open-angle glaucoma, bilateral, stage unspecified: Secondary | ICD-10-CM | POA: Diagnosis not present

## 2021-08-14 DIAGNOSIS — K5730 Diverticulosis of large intestine without perforation or abscess without bleeding: Secondary | ICD-10-CM | POA: Diagnosis not present

## 2021-08-14 DIAGNOSIS — I10 Essential (primary) hypertension: Secondary | ICD-10-CM | POA: Diagnosis not present

## 2021-08-19 DIAGNOSIS — K5730 Diverticulosis of large intestine without perforation or abscess without bleeding: Secondary | ICD-10-CM | POA: Diagnosis not present

## 2021-08-19 DIAGNOSIS — R918 Other nonspecific abnormal finding of lung field: Secondary | ICD-10-CM | POA: Diagnosis not present

## 2021-08-19 DIAGNOSIS — E441 Mild protein-calorie malnutrition: Secondary | ICD-10-CM | POA: Diagnosis not present

## 2021-08-19 DIAGNOSIS — H401130 Primary open-angle glaucoma, bilateral, stage unspecified: Secondary | ICD-10-CM | POA: Diagnosis not present

## 2021-08-19 DIAGNOSIS — I10 Essential (primary) hypertension: Secondary | ICD-10-CM | POA: Diagnosis not present

## 2021-08-19 DIAGNOSIS — K529 Noninfective gastroenteritis and colitis, unspecified: Secondary | ICD-10-CM | POA: Diagnosis not present

## 2021-08-22 DIAGNOSIS — K5730 Diverticulosis of large intestine without perforation or abscess without bleeding: Secondary | ICD-10-CM | POA: Diagnosis not present

## 2021-08-22 DIAGNOSIS — I10 Essential (primary) hypertension: Secondary | ICD-10-CM | POA: Diagnosis not present

## 2021-08-22 DIAGNOSIS — R918 Other nonspecific abnormal finding of lung field: Secondary | ICD-10-CM | POA: Diagnosis not present

## 2021-08-22 DIAGNOSIS — H401130 Primary open-angle glaucoma, bilateral, stage unspecified: Secondary | ICD-10-CM | POA: Diagnosis not present

## 2021-08-22 DIAGNOSIS — E441 Mild protein-calorie malnutrition: Secondary | ICD-10-CM | POA: Diagnosis not present

## 2021-08-22 DIAGNOSIS — K529 Noninfective gastroenteritis and colitis, unspecified: Secondary | ICD-10-CM | POA: Diagnosis not present

## 2021-08-25 DIAGNOSIS — K5730 Diverticulosis of large intestine without perforation or abscess without bleeding: Secondary | ICD-10-CM | POA: Diagnosis not present

## 2021-08-25 DIAGNOSIS — E441 Mild protein-calorie malnutrition: Secondary | ICD-10-CM | POA: Diagnosis not present

## 2021-08-25 DIAGNOSIS — R918 Other nonspecific abnormal finding of lung field: Secondary | ICD-10-CM | POA: Diagnosis not present

## 2021-08-25 DIAGNOSIS — K529 Noninfective gastroenteritis and colitis, unspecified: Secondary | ICD-10-CM | POA: Diagnosis not present

## 2021-08-25 DIAGNOSIS — H401130 Primary open-angle glaucoma, bilateral, stage unspecified: Secondary | ICD-10-CM | POA: Diagnosis not present

## 2021-08-25 DIAGNOSIS — I10 Essential (primary) hypertension: Secondary | ICD-10-CM | POA: Diagnosis not present

## 2021-08-26 DIAGNOSIS — R918 Other nonspecific abnormal finding of lung field: Secondary | ICD-10-CM | POA: Diagnosis not present

## 2021-08-26 DIAGNOSIS — H401130 Primary open-angle glaucoma, bilateral, stage unspecified: Secondary | ICD-10-CM | POA: Diagnosis not present

## 2021-08-26 DIAGNOSIS — R630 Anorexia: Secondary | ICD-10-CM | POA: Diagnosis not present

## 2021-08-26 DIAGNOSIS — E441 Mild protein-calorie malnutrition: Secondary | ICD-10-CM | POA: Diagnosis not present

## 2021-08-26 DIAGNOSIS — E440 Moderate protein-calorie malnutrition: Secondary | ICD-10-CM | POA: Diagnosis not present

## 2021-08-26 DIAGNOSIS — I10 Essential (primary) hypertension: Secondary | ICD-10-CM | POA: Diagnosis not present

## 2021-08-26 DIAGNOSIS — K529 Noninfective gastroenteritis and colitis, unspecified: Secondary | ICD-10-CM | POA: Diagnosis not present

## 2021-08-26 DIAGNOSIS — K5730 Diverticulosis of large intestine without perforation or abscess without bleeding: Secondary | ICD-10-CM | POA: Diagnosis not present

## 2021-08-26 DIAGNOSIS — K219 Gastro-esophageal reflux disease without esophagitis: Secondary | ICD-10-CM | POA: Diagnosis not present

## 2021-08-27 DIAGNOSIS — E441 Mild protein-calorie malnutrition: Secondary | ICD-10-CM | POA: Diagnosis not present

## 2021-08-27 DIAGNOSIS — K5730 Diverticulosis of large intestine without perforation or abscess without bleeding: Secondary | ICD-10-CM | POA: Diagnosis not present

## 2021-08-27 DIAGNOSIS — I10 Essential (primary) hypertension: Secondary | ICD-10-CM | POA: Diagnosis not present

## 2021-08-27 DIAGNOSIS — K529 Noninfective gastroenteritis and colitis, unspecified: Secondary | ICD-10-CM | POA: Diagnosis not present

## 2021-08-27 DIAGNOSIS — H401130 Primary open-angle glaucoma, bilateral, stage unspecified: Secondary | ICD-10-CM | POA: Diagnosis not present

## 2021-08-27 DIAGNOSIS — R918 Other nonspecific abnormal finding of lung field: Secondary | ICD-10-CM | POA: Diagnosis not present

## 2021-09-03 DIAGNOSIS — R918 Other nonspecific abnormal finding of lung field: Secondary | ICD-10-CM | POA: Diagnosis not present

## 2021-09-03 DIAGNOSIS — I10 Essential (primary) hypertension: Secondary | ICD-10-CM | POA: Diagnosis not present

## 2021-09-03 DIAGNOSIS — K5730 Diverticulosis of large intestine without perforation or abscess without bleeding: Secondary | ICD-10-CM | POA: Diagnosis not present

## 2021-09-03 DIAGNOSIS — H401130 Primary open-angle glaucoma, bilateral, stage unspecified: Secondary | ICD-10-CM | POA: Diagnosis not present

## 2021-09-03 DIAGNOSIS — K529 Noninfective gastroenteritis and colitis, unspecified: Secondary | ICD-10-CM | POA: Diagnosis not present

## 2021-09-03 DIAGNOSIS — E441 Mild protein-calorie malnutrition: Secondary | ICD-10-CM | POA: Diagnosis not present

## 2021-09-15 DIAGNOSIS — D1801 Hemangioma of skin and subcutaneous tissue: Secondary | ICD-10-CM | POA: Diagnosis not present

## 2021-09-15 DIAGNOSIS — Z85828 Personal history of other malignant neoplasm of skin: Secondary | ICD-10-CM | POA: Diagnosis not present

## 2021-09-15 DIAGNOSIS — L821 Other seborrheic keratosis: Secondary | ICD-10-CM | POA: Diagnosis not present

## 2021-09-15 DIAGNOSIS — Z08 Encounter for follow-up examination after completed treatment for malignant neoplasm: Secondary | ICD-10-CM | POA: Diagnosis not present

## 2021-09-15 DIAGNOSIS — R229 Localized swelling, mass and lump, unspecified: Secondary | ICD-10-CM | POA: Diagnosis not present

## 2021-09-26 DIAGNOSIS — K219 Gastro-esophageal reflux disease without esophagitis: Secondary | ICD-10-CM | POA: Diagnosis not present

## 2021-09-26 DIAGNOSIS — R918 Other nonspecific abnormal finding of lung field: Secondary | ICD-10-CM | POA: Diagnosis not present

## 2021-09-26 DIAGNOSIS — K529 Noninfective gastroenteritis and colitis, unspecified: Secondary | ICD-10-CM | POA: Diagnosis not present

## 2021-09-26 DIAGNOSIS — R630 Anorexia: Secondary | ICD-10-CM | POA: Diagnosis not present

## 2021-09-26 DIAGNOSIS — I10 Essential (primary) hypertension: Secondary | ICD-10-CM | POA: Diagnosis not present

## 2021-09-26 DIAGNOSIS — E440 Moderate protein-calorie malnutrition: Secondary | ICD-10-CM | POA: Diagnosis not present

## 2021-10-09 DIAGNOSIS — H401122 Primary open-angle glaucoma, left eye, moderate stage: Secondary | ICD-10-CM | POA: Diagnosis not present

## 2021-10-09 DIAGNOSIS — H401113 Primary open-angle glaucoma, right eye, severe stage: Secondary | ICD-10-CM | POA: Diagnosis not present

## 2021-11-06 DIAGNOSIS — K219 Gastro-esophageal reflux disease without esophagitis: Secondary | ICD-10-CM | POA: Diagnosis not present

## 2021-11-06 DIAGNOSIS — E440 Moderate protein-calorie malnutrition: Secondary | ICD-10-CM | POA: Diagnosis not present

## 2021-11-06 DIAGNOSIS — R918 Other nonspecific abnormal finding of lung field: Secondary | ICD-10-CM | POA: Diagnosis not present

## 2021-11-06 DIAGNOSIS — K529 Noninfective gastroenteritis and colitis, unspecified: Secondary | ICD-10-CM | POA: Diagnosis not present

## 2021-11-06 DIAGNOSIS — R630 Anorexia: Secondary | ICD-10-CM | POA: Diagnosis not present

## 2021-11-06 DIAGNOSIS — R350 Frequency of micturition: Secondary | ICD-10-CM | POA: Diagnosis not present

## 2021-11-06 DIAGNOSIS — I10 Essential (primary) hypertension: Secondary | ICD-10-CM | POA: Diagnosis not present

## 2021-12-06 IMAGING — RF DG ESOPHAGUS
10 series · 14 of 24 positions shown · non-contrast
Comparison: None.

CLINICAL DATA: 89-year-old female with coughing episodes with
liquids and sticking of solid foods in the throat.

EXAM:
ESOPHOGRAM/BARIUM SWALLOW
TECHNIQUE: Single contrast examination was performed using thin barium.
FLUOROSCOPY TIME:  Fluoroscopy Time:  3 minutes 36 seconds
Radiation Exposure Index (if provided by the fluoroscopic device):
4.4 mGy
Number of Acquired Spot Images: 5

[Series 1: sequence · 2 of 28 frames shown (1 of 7)]
[frame 2/28]
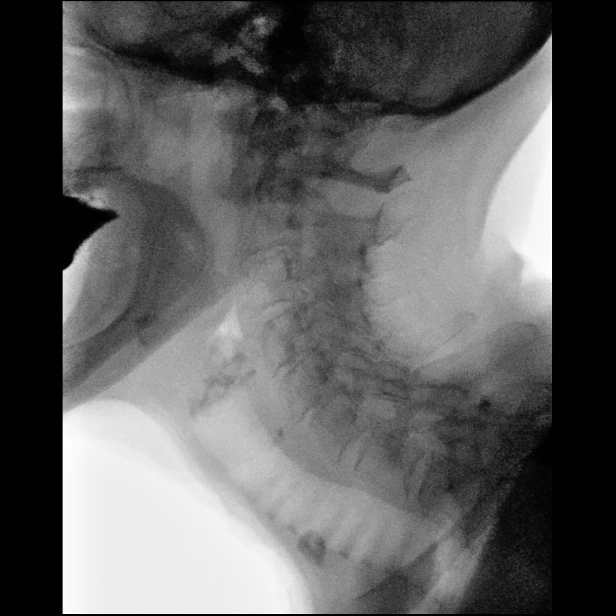
[frame 24/28]
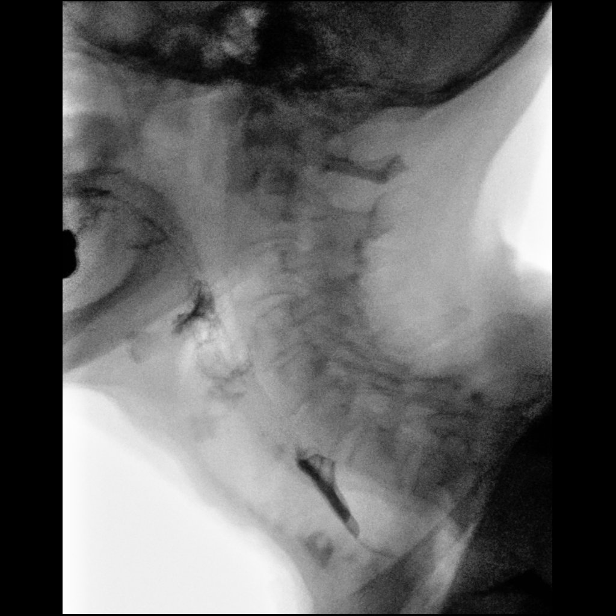

[Series 3: sequence · 1 of 18 frames shown (2 of 7)]
[frame 10/18]
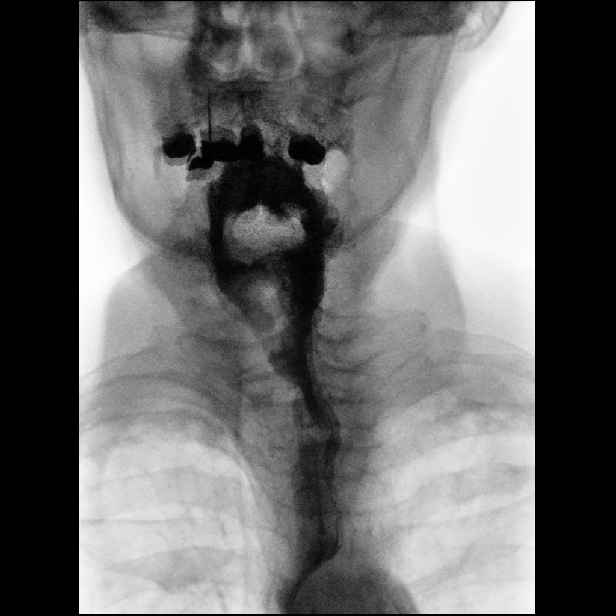

[Series 4: sequence · 2 of 12 frames shown (3 of 7)]
[frame 7/12]
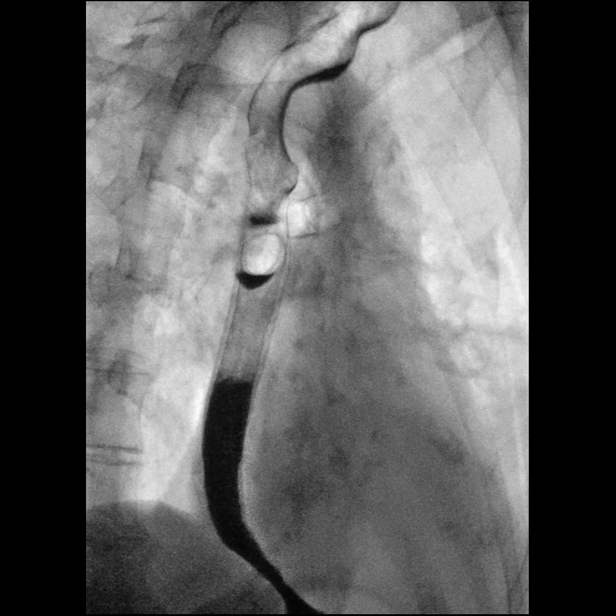
[frame 11/12]
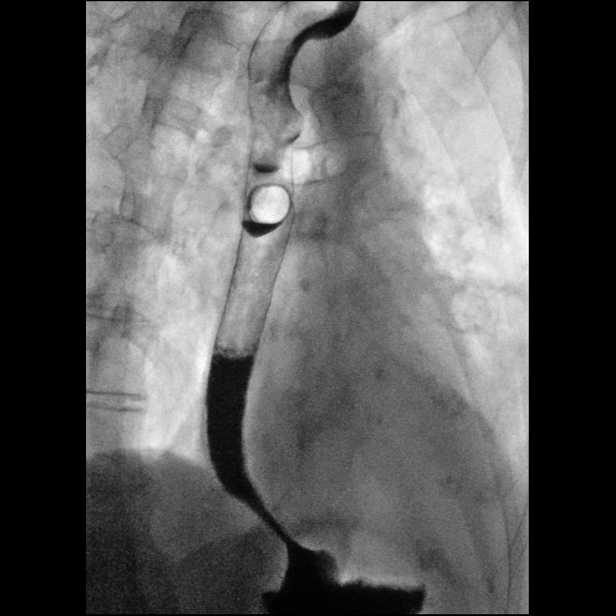

[Series 5: one shot · 0.15mm/px · 1 of 3 slices shown (1 of 3)]
[im 2/3]
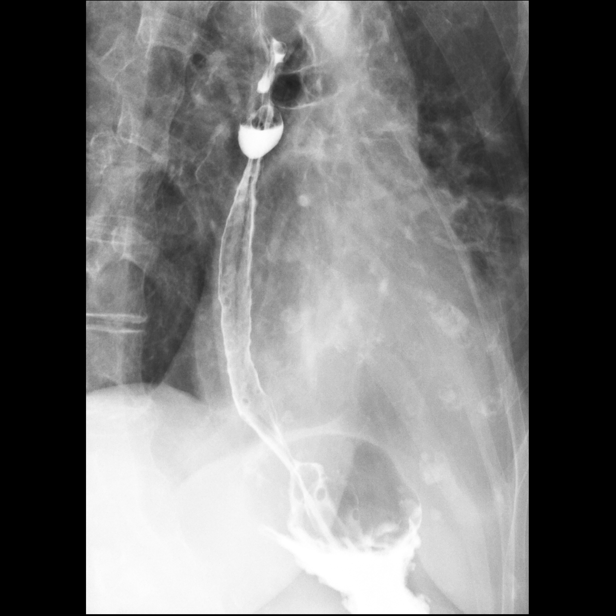

[Series 6: sequence · 2 of 27 frames shown (4 of 7)]
[frame 14/27]
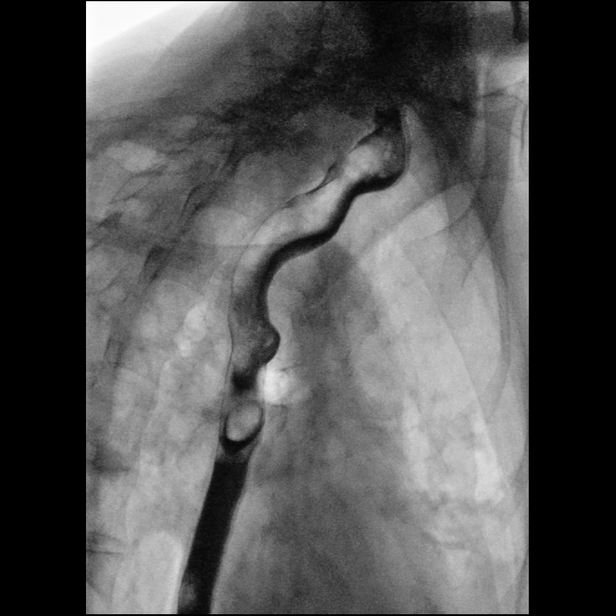
[frame 23/27]
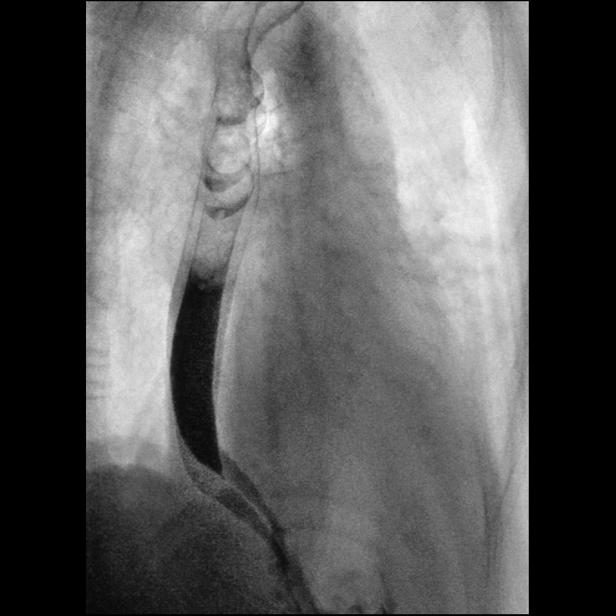

[Series 8: sequence · 1 of 47 frames shown (5 of 7)]
[frame 24/47]
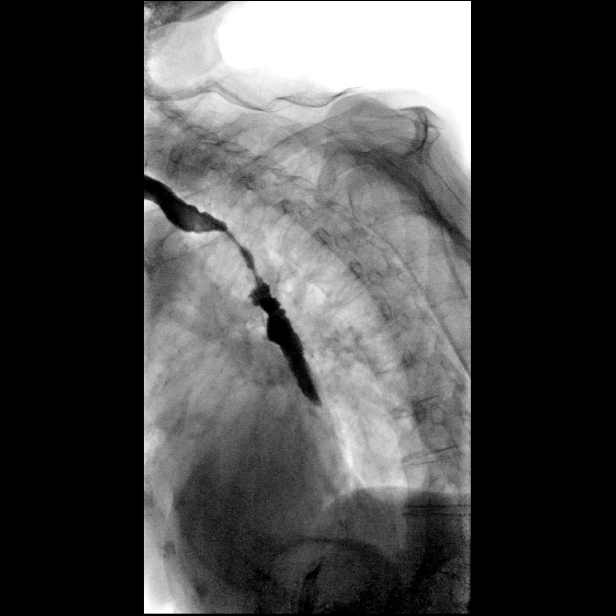

[Series 9: sequence · 1 of 150 frames shown (6 of 7)]
[frame 72/150]
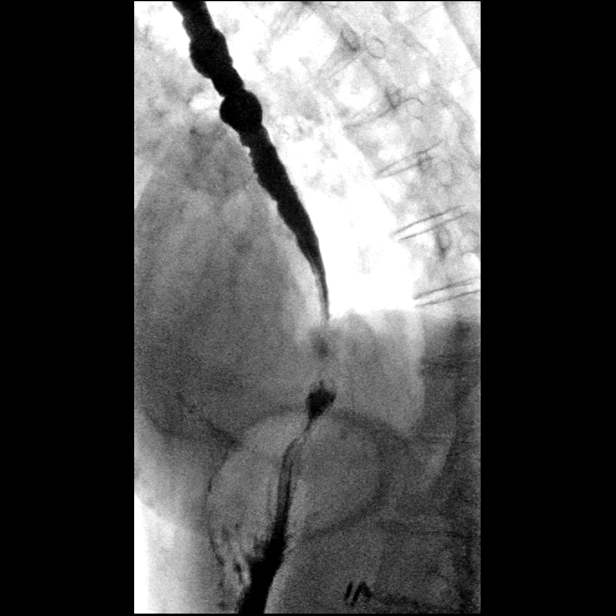

[Series 10: one shot · 2 of 3 slices shown (2 of 3)]
[im 1/3]
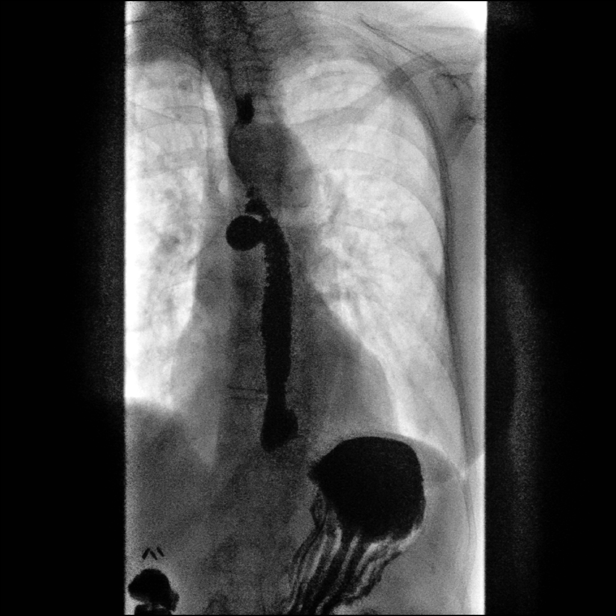
[im 3/3]
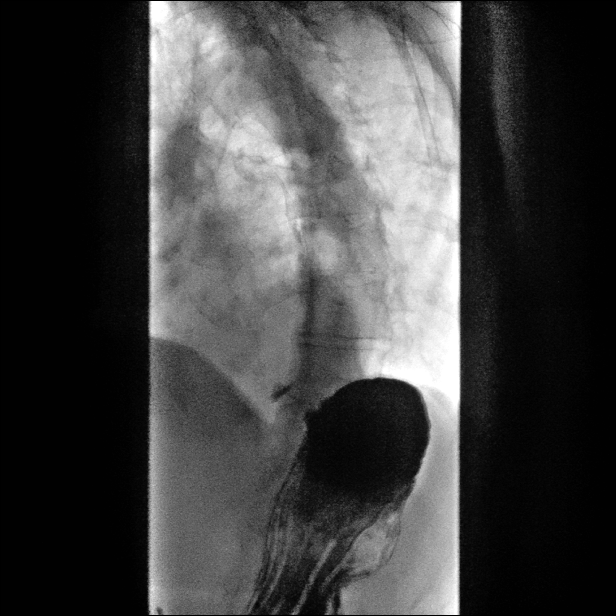

[Series 11: sequence · 1 of 87 frames shown (7 of 7)]
[frame 52/87]
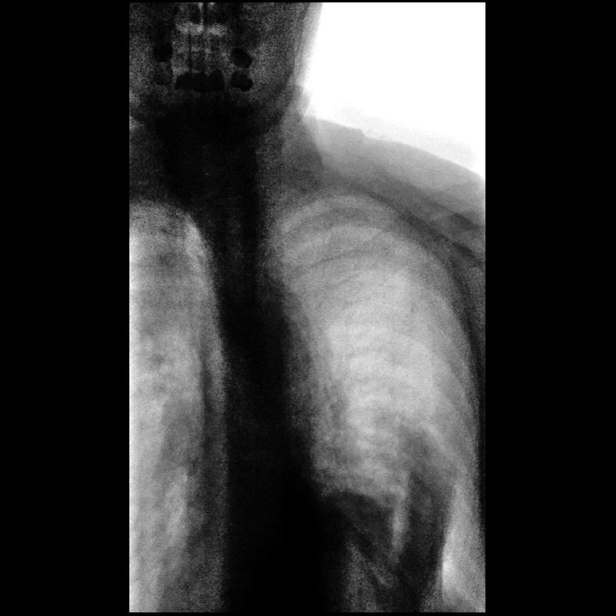

[Series 12: one shot · 1 of 2 slices shown (3 of 3)]
[im 2/2]
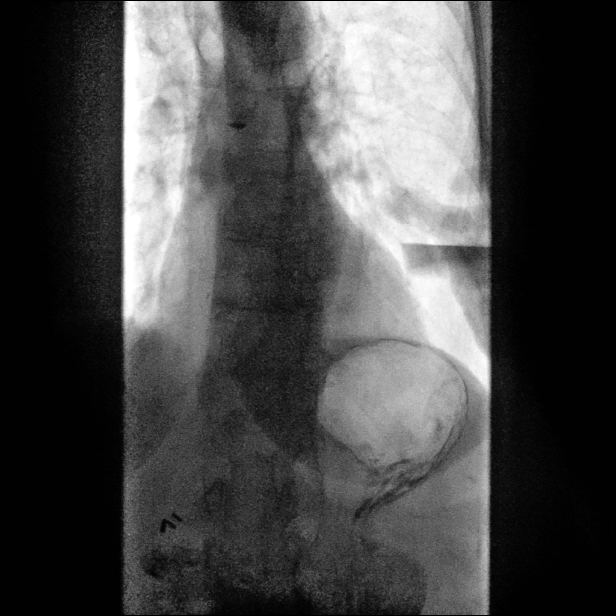

[14 of 24 positions shown; findings below may reference images not displayed]

FINDINGS: Normal oral and pharyngeal phases of swallowing, with no laryngeal
penetration or tracheobronchial aspiration. No significant barium
retention in the pharynx. No evidence of pharyngeal mass, stricture
or diverticulum. No cricopharyngeus muscle dysfunction.

Moderate to severe esophageal dysmotility, characterized by proximal
escape, poor primary peristaltic contraction and prominent tertiary
contractions throughout the thoracic esophagus, compatible with
presbyesophagus. No hiatal hernia. No gastroesophageal reflux
elicited, despite provocative maneuvers including water siphon test
and Valsalva maneuver. There is a small to moderate rounded
diverticulum in the right lateral wall of the midthoracic esophagus.
Normal esophageal distensibility, with no evidence of esophageal
mass or stricture. The swallowed 13 mm barium tablet traversed the
esophagus into the stomach without significant delay.
IMPRESSION: 1. Moderate to severe esophageal dysmotility, with presbyesophagus
pattern.
2. Small to moderate rounded diverticulum in the right lateral wall
of the mid thoracic esophagus, uncertain if this represents a
pulsion or traction diverticulum.
3. No hiatal hernia.  No gastroesophageal reflux elicited.
4. No evidence of esophageal mass or stricture.
5. Normal oral and pharyngeal phase of swallowing, with no laryngeal
penetration or tracheobronchial aspiration.

## 2022-01-07 DIAGNOSIS — H401113 Primary open-angle glaucoma, right eye, severe stage: Secondary | ICD-10-CM | POA: Diagnosis not present

## 2022-01-07 DIAGNOSIS — H401122 Primary open-angle glaucoma, left eye, moderate stage: Secondary | ICD-10-CM | POA: Diagnosis not present

## 2022-03-02 DIAGNOSIS — H401113 Primary open-angle glaucoma, right eye, severe stage: Secondary | ICD-10-CM | POA: Diagnosis not present

## 2022-03-02 DIAGNOSIS — H401122 Primary open-angle glaucoma, left eye, moderate stage: Secondary | ICD-10-CM | POA: Diagnosis not present

## 2022-04-15 DIAGNOSIS — H401122 Primary open-angle glaucoma, left eye, moderate stage: Secondary | ICD-10-CM | POA: Diagnosis not present

## 2022-04-15 DIAGNOSIS — H401113 Primary open-angle glaucoma, right eye, severe stage: Secondary | ICD-10-CM | POA: Diagnosis not present

## 2022-08-12 DIAGNOSIS — I10 Essential (primary) hypertension: Secondary | ICD-10-CM | POA: Diagnosis not present

## 2022-08-12 DIAGNOSIS — Z23 Encounter for immunization: Secondary | ICD-10-CM | POA: Diagnosis not present

## 2022-08-12 DIAGNOSIS — Z681 Body mass index (BMI) 19 or less, adult: Secondary | ICD-10-CM | POA: Diagnosis not present

## 2022-08-12 DIAGNOSIS — E440 Moderate protein-calorie malnutrition: Secondary | ICD-10-CM | POA: Diagnosis not present

## 2022-08-12 DIAGNOSIS — F3341 Major depressive disorder, recurrent, in partial remission: Secondary | ICD-10-CM | POA: Diagnosis not present

## 2022-09-07 DIAGNOSIS — Z08 Encounter for follow-up examination after completed treatment for malignant neoplasm: Secondary | ICD-10-CM | POA: Diagnosis not present

## 2022-09-07 DIAGNOSIS — L814 Other melanin hyperpigmentation: Secondary | ICD-10-CM | POA: Diagnosis not present

## 2022-09-07 DIAGNOSIS — D2239 Melanocytic nevi of other parts of face: Secondary | ICD-10-CM | POA: Diagnosis not present

## 2022-09-07 DIAGNOSIS — D485 Neoplasm of uncertain behavior of skin: Secondary | ICD-10-CM | POA: Diagnosis not present

## 2022-09-07 DIAGNOSIS — Z85828 Personal history of other malignant neoplasm of skin: Secondary | ICD-10-CM | POA: Diagnosis not present

## 2022-09-07 DIAGNOSIS — L821 Other seborrheic keratosis: Secondary | ICD-10-CM | POA: Diagnosis not present

## 2022-09-09 DIAGNOSIS — H401113 Primary open-angle glaucoma, right eye, severe stage: Secondary | ICD-10-CM | POA: Diagnosis not present

## 2022-09-09 DIAGNOSIS — H401122 Primary open-angle glaucoma, left eye, moderate stage: Secondary | ICD-10-CM | POA: Diagnosis not present

## 2022-09-27 DIAGNOSIS — R350 Frequency of micturition: Secondary | ICD-10-CM | POA: Diagnosis not present

## 2022-09-27 DIAGNOSIS — R399 Unspecified symptoms and signs involving the genitourinary system: Secondary | ICD-10-CM | POA: Diagnosis not present

## 2022-09-27 DIAGNOSIS — R300 Dysuria: Secondary | ICD-10-CM | POA: Diagnosis not present

## 2022-09-27 DIAGNOSIS — N390 Urinary tract infection, site not specified: Secondary | ICD-10-CM | POA: Diagnosis not present

## 2022-10-23 DIAGNOSIS — R460 Very low level of personal hygiene: Secondary | ICD-10-CM | POA: Diagnosis not present

## 2022-10-23 DIAGNOSIS — R29898 Other symptoms and signs involving the musculoskeletal system: Secondary | ICD-10-CM | POA: Diagnosis not present

## 2022-10-23 DIAGNOSIS — F3341 Major depressive disorder, recurrent, in partial remission: Secondary | ICD-10-CM | POA: Diagnosis not present

## 2022-10-23 DIAGNOSIS — F0390 Unspecified dementia without behavioral disturbance: Secondary | ICD-10-CM | POA: Diagnosis not present

## 2022-10-23 DIAGNOSIS — E440 Moderate protein-calorie malnutrition: Secondary | ICD-10-CM | POA: Diagnosis not present

## 2022-10-24 ENCOUNTER — Encounter (HOSPITAL_COMMUNITY): Payer: Self-pay

## 2022-10-24 ENCOUNTER — Emergency Department (HOSPITAL_BASED_OUTPATIENT_CLINIC_OR_DEPARTMENT_OTHER): Payer: Medicare PPO

## 2022-10-24 ENCOUNTER — Other Ambulatory Visit: Payer: Self-pay

## 2022-10-24 ENCOUNTER — Emergency Department (HOSPITAL_COMMUNITY): Payer: Medicare PPO

## 2022-10-24 ENCOUNTER — Inpatient Hospital Stay (HOSPITAL_COMMUNITY)
Admission: EM | Admit: 2022-10-24 | Discharge: 2022-10-30 | DRG: 175 | Disposition: A | Discharge status: Skilled Nursing Facility | Payer: Medicare PPO | Attending: Family Medicine | Admitting: Family Medicine

## 2022-10-24 ENCOUNTER — Other Ambulatory Visit (HOSPITAL_COMMUNITY): Payer: Medicare PPO

## 2022-10-24 DIAGNOSIS — Z9181 History of falling: Secondary | ICD-10-CM | POA: Diagnosis not present

## 2022-10-24 DIAGNOSIS — R2681 Unsteadiness on feet: Secondary | ICD-10-CM | POA: Diagnosis not present

## 2022-10-24 DIAGNOSIS — S0101XA Laceration without foreign body of scalp, initial encounter: Secondary | ICD-10-CM | POA: Diagnosis present

## 2022-10-24 DIAGNOSIS — I2699 Other pulmonary embolism without acute cor pulmonale: Principal | ICD-10-CM | POA: Diagnosis present

## 2022-10-24 DIAGNOSIS — J189 Pneumonia, unspecified organism: Secondary | ICD-10-CM | POA: Diagnosis present

## 2022-10-24 DIAGNOSIS — J188 Other pneumonia, unspecified organism: Secondary | ICD-10-CM | POA: Diagnosis not present

## 2022-10-24 DIAGNOSIS — I82409 Acute embolism and thrombosis of unspecified deep veins of unspecified lower extremity: Secondary | ICD-10-CM | POA: Insufficient documentation

## 2022-10-24 DIAGNOSIS — Z1152 Encounter for screening for COVID-19: Secondary | ICD-10-CM

## 2022-10-24 DIAGNOSIS — W19XXXA Unspecified fall, initial encounter: Secondary | ICD-10-CM

## 2022-10-24 DIAGNOSIS — R4189 Other symptoms and signs involving cognitive functions and awareness: Secondary | ICD-10-CM | POA: Insufficient documentation

## 2022-10-24 DIAGNOSIS — Z66 Do not resuscitate: Secondary | ICD-10-CM | POA: Diagnosis present

## 2022-10-24 DIAGNOSIS — I82411 Acute embolism and thrombosis of right femoral vein: Secondary | ICD-10-CM | POA: Diagnosis present

## 2022-10-24 DIAGNOSIS — Y92009 Unspecified place in unspecified non-institutional (private) residence as the place of occurrence of the external cause: Secondary | ICD-10-CM

## 2022-10-24 DIAGNOSIS — R636 Underweight: Secondary | ICD-10-CM | POA: Diagnosis present

## 2022-10-24 DIAGNOSIS — Z681 Body mass index (BMI) 19 or less, adult: Secondary | ICD-10-CM

## 2022-10-24 DIAGNOSIS — R293 Abnormal posture: Secondary | ICD-10-CM | POA: Diagnosis not present

## 2022-10-24 DIAGNOSIS — R000 Tachycardia, unspecified: Secondary | ICD-10-CM | POA: Diagnosis not present

## 2022-10-24 DIAGNOSIS — J984 Other disorders of lung: Secondary | ICD-10-CM | POA: Diagnosis not present

## 2022-10-24 DIAGNOSIS — Y92002 Bathroom of unspecified non-institutional (private) residence single-family (private) house as the place of occurrence of the external cause: Secondary | ICD-10-CM

## 2022-10-24 DIAGNOSIS — Z96642 Presence of left artificial hip joint: Secondary | ICD-10-CM | POA: Diagnosis present

## 2022-10-24 DIAGNOSIS — I82431 Acute embolism and thrombosis of right popliteal vein: Secondary | ICD-10-CM | POA: Diagnosis present

## 2022-10-24 DIAGNOSIS — S199XXA Unspecified injury of neck, initial encounter: Secondary | ICD-10-CM | POA: Diagnosis not present

## 2022-10-24 DIAGNOSIS — H409 Unspecified glaucoma: Secondary | ICD-10-CM | POA: Diagnosis present

## 2022-10-24 DIAGNOSIS — R278 Other lack of coordination: Secondary | ICD-10-CM | POA: Diagnosis not present

## 2022-10-24 DIAGNOSIS — Z7982 Long term (current) use of aspirin: Secondary | ICD-10-CM

## 2022-10-24 DIAGNOSIS — F0390 Unspecified dementia without behavioral disturbance: Secondary | ICD-10-CM | POA: Diagnosis present

## 2022-10-24 DIAGNOSIS — J449 Chronic obstructive pulmonary disease, unspecified: Secondary | ICD-10-CM | POA: Diagnosis not present

## 2022-10-24 DIAGNOSIS — Z820 Family history of epilepsy and other diseases of the nervous system: Secondary | ICD-10-CM | POA: Diagnosis not present

## 2022-10-24 DIAGNOSIS — I82401 Acute embolism and thrombosis of unspecified deep veins of right lower extremity: Secondary | ICD-10-CM | POA: Diagnosis not present

## 2022-10-24 DIAGNOSIS — Z8249 Family history of ischemic heart disease and other diseases of the circulatory system: Secondary | ICD-10-CM

## 2022-10-24 DIAGNOSIS — I82441 Acute embolism and thrombosis of right tibial vein: Secondary | ICD-10-CM | POA: Diagnosis present

## 2022-10-24 DIAGNOSIS — M25571 Pain in right ankle and joints of right foot: Secondary | ICD-10-CM | POA: Diagnosis not present

## 2022-10-24 DIAGNOSIS — G319 Degenerative disease of nervous system, unspecified: Secondary | ICD-10-CM | POA: Diagnosis not present

## 2022-10-24 DIAGNOSIS — R627 Adult failure to thrive: Secondary | ICD-10-CM | POA: Diagnosis present

## 2022-10-24 DIAGNOSIS — I82451 Acute embolism and thrombosis of right peroneal vein: Secondary | ICD-10-CM | POA: Diagnosis present

## 2022-10-24 DIAGNOSIS — M6281 Muscle weakness (generalized): Secondary | ICD-10-CM | POA: Diagnosis not present

## 2022-10-24 DIAGNOSIS — I10 Essential (primary) hypertension: Secondary | ICD-10-CM | POA: Diagnosis present

## 2022-10-24 DIAGNOSIS — S0990XA Unspecified injury of head, initial encounter: Secondary | ICD-10-CM | POA: Diagnosis not present

## 2022-10-24 DIAGNOSIS — Z90710 Acquired absence of both cervix and uterus: Secondary | ICD-10-CM

## 2022-10-24 DIAGNOSIS — W1839XA Other fall on same level, initial encounter: Secondary | ICD-10-CM | POA: Diagnosis present

## 2022-10-24 DIAGNOSIS — Z79899 Other long term (current) drug therapy: Secondary | ICD-10-CM | POA: Diagnosis not present

## 2022-10-24 DIAGNOSIS — I2693 Single subsegmental pulmonary embolism without acute cor pulmonale: Secondary | ICD-10-CM | POA: Diagnosis not present

## 2022-10-24 DIAGNOSIS — Z043 Encounter for examination and observation following other accident: Secondary | ICD-10-CM | POA: Diagnosis not present

## 2022-10-24 DIAGNOSIS — E43 Unspecified severe protein-calorie malnutrition: Secondary | ICD-10-CM | POA: Diagnosis present

## 2022-10-24 LAB — CBC WITH DIFFERENTIAL/PLATELET
Abs Immature Granulocytes: 0.27 10*3/uL — ABNORMAL HIGH (ref 0.00–0.07)
Basophils Absolute: 0.1 10*3/uL (ref 0.0–0.1)
Basophils Relative: 1 %
Eosinophils Absolute: 0 10*3/uL (ref 0.0–0.5)
Eosinophils Relative: 0 %
HCT: 42.9 % (ref 36.0–46.0)
Hemoglobin: 14.2 g/dL (ref 12.0–15.0)
Immature Granulocytes: 2 %
Lymphocytes Relative: 7 %
Lymphs Abs: 0.9 10*3/uL (ref 0.7–4.0)
MCH: 31.1 pg (ref 26.0–34.0)
MCHC: 33.1 g/dL (ref 30.0–36.0)
MCV: 93.9 fL (ref 80.0–100.0)
Monocytes Absolute: 1.5 10*3/uL — ABNORMAL HIGH (ref 0.1–1.0)
Monocytes Relative: 11 %
Neutro Abs: 10.7 10*3/uL — ABNORMAL HIGH (ref 1.7–7.7)
Neutrophils Relative %: 79 %
Platelets: 188 10*3/uL (ref 150–400)
RBC: 4.57 MIL/uL (ref 3.87–5.11)
RDW: 15 % (ref 11.5–15.5)
WBC: 13.4 10*3/uL — ABNORMAL HIGH (ref 4.0–10.5)
nRBC: 0 % (ref 0.0–0.2)

## 2022-10-24 LAB — LACTIC ACID, PLASMA
Lactic Acid, Venous: 1.2 mmol/L (ref 0.5–1.9)
Lactic Acid, Venous: 1.2 mmol/L (ref 0.5–1.9)

## 2022-10-24 LAB — COMPREHENSIVE METABOLIC PANEL
ALT: 20 U/L (ref 0–44)
AST: 31 U/L (ref 15–41)
Albumin: 3.3 g/dL — ABNORMAL LOW (ref 3.5–5.0)
Alkaline Phosphatase: 39 U/L (ref 38–126)
Anion gap: 11 (ref 5–15)
BUN: 49 mg/dL — ABNORMAL HIGH (ref 8–23)
CO2: 22 mmol/L (ref 22–32)
Calcium: 9.2 mg/dL (ref 8.9–10.3)
Chloride: 105 mmol/L (ref 98–111)
Creatinine, Ser: 0.71 mg/dL (ref 0.44–1.00)
GFR, Estimated: >60 mL/min (ref >60)
Glucose, Bld: 118 mg/dL — ABNORMAL HIGH (ref 70–99)
Potassium: 4.3 mmol/L (ref 3.5–5.1)
Sodium: 138 mmol/L (ref 135–145)
Total Bilirubin: 1.2 mg/dL (ref 0.3–1.2)
Total Protein: 6.9 g/dL (ref 6.5–8.1)

## 2022-10-24 LAB — URINALYSIS, ROUTINE W REFLEX MICROSCOPIC
Bacteria, UA: NONE SEEN
Bilirubin Urine: NEGATIVE
Glucose, UA: NEGATIVE mg/dL
Ketones, ur: NEGATIVE mg/dL
Nitrite: NEGATIVE
Protein, ur: NEGATIVE mg/dL
Specific Gravity, Urine: 1.015 (ref 1.005–1.030)
pH: 7 (ref 5.0–8.0)

## 2022-10-24 LAB — PROTIME-INR
INR: 1.2 (ref 0.8–1.2)
Prothrombin Time: 15.3 seconds — ABNORMAL HIGH (ref 11.4–15.2)

## 2022-10-24 LAB — TROPONIN I (HIGH SENSITIVITY): Troponin I (High Sensitivity): 12 ng/L (ref <18)

## 2022-10-24 LAB — HEPARIN LEVEL (UNFRACTIONATED): Heparin Unfractionated: 0.23 IU/mL — ABNORMAL LOW (ref 0.30–0.70)

## 2022-10-24 LAB — BRAIN NATRIURETIC PEPTIDE: B Natriuretic Peptide: 96.9 pg/mL (ref 0.0–100.0)

## 2022-10-24 MED ORDER — GUAIFENESIN 100 MG/5ML PO LIQD: 5.0000 mL | ORAL | Status: DC | PRN

## 2022-10-24 MED ORDER — ACETAMINOPHEN 325 MG PO TABS
650.0000 mg | ORAL_TABLET | Freq: Four times a day (QID) | ORAL | Status: DC | PRN
  Administered 2022-10-24: 650 mg via ORAL
  Filled 2022-10-24: qty 2

## 2022-10-24 MED ORDER — IOHEXOL 350 MG/ML SOLN
75.0000 mL | Freq: Once | INTRAVENOUS | Status: AC | PRN
  Administered 2022-10-24: 75 mL via INTRAVENOUS

## 2022-10-24 MED ORDER — ACETAMINOPHEN 650 MG RE SUPP: 650.0000 mg | Freq: Four times a day (QID) | RECTAL | Status: DC | PRN

## 2022-10-24 MED ORDER — ENSURE ENLIVE PO LIQD
237.0000 mL | Freq: Three times a day (TID) | ORAL | Status: DC
  Administered 2022-10-24 – 2022-10-30 (×13): 237 mL via ORAL

## 2022-10-24 MED ORDER — HYDROCODONE-ACETAMINOPHEN 5-325 MG PO TABS
1.0000 | ORAL_TABLET | ORAL | Status: DC | PRN
  Administered 2022-10-25: 2 via ORAL
  Filled 2022-10-24: qty 2

## 2022-10-24 MED ORDER — LIDOCAINE-EPINEPHRINE-TETRACAINE (LET) TOPICAL GEL
3.0000 mL | Freq: Once | TOPICAL | Status: AC
  Administered 2022-10-24: 3 mL via TOPICAL
  Filled 2022-10-24: qty 3

## 2022-10-24 MED ORDER — HEPARIN (PORCINE) 25000 UT/250ML-% IV SOLN
700.0000 [IU]/h | INTRAVENOUS | Status: DC
  Administered 2022-10-24: 600 [IU]/h via INTRAVENOUS
  Administered 2022-10-25: 700 [IU]/h via INTRAVENOUS
  Filled 2022-10-24 (×3): qty 250

## 2022-10-24 MED ORDER — SODIUM CHLORIDE 0.9 % IV SOLN
500.0000 mg | Freq: Once | INTRAVENOUS | Status: AC
  Administered 2022-10-24: 500 mg via INTRAVENOUS
  Filled 2022-10-24: qty 5

## 2022-10-24 MED ORDER — SODIUM CHLORIDE 0.9 % IV SOLN
500.0000 mg | INTRAVENOUS | Status: DC
  Administered 2022-10-25: 500 mg via INTRAVENOUS
  Filled 2022-10-24 (×2): qty 5

## 2022-10-24 MED ORDER — ADULT MULTIVITAMIN W/MINERALS CH
1.0000 | ORAL_TABLET | Freq: Every day | ORAL | Status: DC
  Administered 2022-10-25 – 2022-10-30 (×6): 1 via ORAL
  Filled 2022-10-24 (×6): qty 1

## 2022-10-24 MED ORDER — HEPARIN BOLUS VIA INFUSION
1000.0000 [IU] | Freq: Once | INTRAVENOUS | Status: AC
  Administered 2022-10-24: 1000 [IU] via INTRAVENOUS
  Filled 2022-10-24: qty 1000

## 2022-10-24 MED ORDER — SODIUM CHLORIDE 0.9 % IV SOLN
1.0000 g | Freq: Once | INTRAVENOUS | Status: AC
  Administered 2022-10-24: 1 g via INTRAVENOUS
  Filled 2022-10-24: qty 10

## 2022-10-24 MED ORDER — SODIUM CHLORIDE 0.9 % IV SOLN
1.0000 g | INTRAVENOUS | Status: DC
  Administered 2022-10-25 – 2022-10-27 (×3): 1 g via INTRAVENOUS
  Filled 2022-10-24 (×3): qty 10

## 2022-10-24 MED ORDER — SODIUM CHLORIDE 0.9 % IV BOLUS
500.0000 mL | Freq: Once | INTRAVENOUS | Status: AC
  Administered 2022-10-24: 500 mL via INTRAVENOUS

## 2022-10-24 NOTE — Progress Notes (Signed)
ANTICOAGULATION CONSULT NOTE - Follow Up Consult  Pharmacy Consult for Heparin Indication:  DVT/ PE  Allergies  Allergen Reactions   Other Other (See Comments)    Glaucoma drop with a dark green top - not sure of name -    Patient Measurements: Height: 4\' 11"  (149.9 cm) Weight: 38.6 kg (85 lb) IBW/kg (Calculated) : 43.2 Heparin Dosing Weight:38.6 kg  Vital Signs: Temp: 98.9 F (37.2 C) (01/06 1411) Temp Source: Oral (01/06 1411) BP: 104/62 (01/06 1411) Pulse Rate: 85 (01/06 1411)  Labs: Recent Labs    10/24/22 0434 10/24/22 0750 10/24/22 1558  HGB 14.2  --   --   HCT 42.9  --   --   PLT 188  --   --   LABPROT  --  15.3*  --   INR  --  1.2  --   HEPARINUNFRC  --   --  0.23*  CREATININE 0.71  --   --   TROPONINIHS  --  12  --     Estimated Creatinine Clearance: 28.5 mL/min (by C-G formula based on SCr of 0.71 mg/dL).  Assessment: AC/Heme: New PE. RLE DVT  Hep level 0.23 slightly below goal.  Goal of Therapy:  Heparin level 0.3-0.7 units/ml Monitor platelets by anticoagulation protocol: Yes   Plan:  Increase IV heparin to 700 units/hr Recheck level in 6 hrs. Daily HL and CBC   Gustie Bobb S. Alford Highland, PharmD, BCPS Clinical Staff Pharmacist [email or web address] Alford Highland, Jemarion Roycroft Stillinger 10/24/2022,5:01 PM

## 2022-10-24 NOTE — Progress Notes (Signed)
Lower extremity venous right study completed.  Preliminary results relayed to [facility], DO.  See CV Proc for preliminary results report.   Darlin Coco, RDMS, RVT

## 2022-10-24 NOTE — ED Triage Notes (Signed)
Pt bib [facility] from home. Pt fell of commode and hit back of head. Denis LOC and recalls fall. Pt behaving at baseline  128/76 100HR 99% RA 20RR  Pt co pain/swelling in right ankle.

## 2022-10-24 NOTE — H&P (Addendum)
History and Physical    Patient: Julia Hampton MVH:[id number] DOB: [birth year] DOA: 10/24/2022 DOS: the patient was seen and examined on 10/24/2022 PCP: [facility] [place]  Patient coming from: Home  Chief Complaint:  Chief Complaint  Patient presents with   Fall   HPI: Julia Hampton is a 89 y.o. female with medical history significant of glaucoma. Presenting with a fall. Her daughter reports that she has had 1 week of right leg swelling. She has otherwise been in her normal state of health. She has not complained of chest pain, palpitations, or dyspnea. She was doing fine until this morning when she went to the bath room. She was getting up from the toilet and lost her balance. She ended up falling against the tub. She hit her head but did not pass out. Family was able to assister her up and call for EMS.   Review of Systems: As mentioned in the history of present illness. All other systems reviewed and are negative. Past Medical History:  Diagnosis Date   Complication of anesthesia    trouble waking patient up   Glaucoma    Past Surgical History:  Procedure Laterality Date   ABDOMINAL HYSTERECTOMY     CHOLECYSTECTOMY     HIP ARTHROPLASTY Left 09/28/2013   Procedure: ARTHROPLASTY BIPOLAR HIP;  Surgeon: Johnn Hai, MD;  Location: [facility];  Service: Orthopedics;  Laterality: Left;   MASTOIDECTOMY     Social History:  reports that she has never smoked. She has never used smokeless tobacco. She reports that she does not drink alcohol and does not use drugs.  Allergies  Allergen Reactions   Other Other (See Comments)    Glaucoma drop with a dark green top - not sure of name -    Family History  Problem Relation Age of Onset   Heart failure Mother    Parkinson's disease Father     Prior to Admission medications   Medication Sig Start Date End Date Taking? Authorizing Provider  acetaminophen (TYLENOL) 500 MG tablet Take 500 mg by mouth every 6 (six)  hours as needed.    [provider]  Ascorbic Acid (VITAMIN C PO) Take by mouth.    [provider]  aspirin EC 81 MG tablet Take 81 mg by mouth daily.    [provider]  bimatoprost (LUMIGAN) 0.01 % SOLN Place 1 drop into both eyes at bedtime.    [provider]  bismuth subsalicylate (PEPTO-BISMOL) 262 MG/15ML suspension Take 30 mLs by mouth every 6 (six) hours as needed. 01/22/17   Leo Grosser, MD  brinzolamide (AZOPT) 1 % ophthalmic suspension Place 1 drop into both eyes 2 (two) times daily.     [provider]  feeding supplement, ENSURE ENLIVE, (ENSURE ENLIVE) LIQD Take 237 mLs by mouth 3 (three) times daily between meals. 01/17/17   Lavina Hamman, MD  guaiFENesin (MUCINEX) 600 MG 12 hr tablet Take 1 tablet (600 mg total) by mouth 2 (two) times daily. 01/17/17   Lavina Hamman, MD  Multiple Vitamin (MULTIVITAMIN WITH MINERALS) TABS tablet Take 1 tablet by mouth daily.    [provider]  polycarbophil (FIBERCON) 625 MG tablet Take 1 tablet (625 mg total) by mouth daily. 01/22/17   Leo Grosser, MD  polyethylene glycol [facility] / Floria Raveling) packet Take 17 g by mouth daily as needed for mild constipation. 01/17/17   Lavina Hamman, MD  saccharomyces boulardii (FLORASTOR) 250 MG capsule Take  1 capsule (250 mg total) by mouth 2 (two) times daily. 01/17/17   Lavina Hamman, MD  timolol (TIMOPTIC) 0.5 % ophthalmic solution Place 1 drop into both eyes daily after lunch.    [provider]    Physical Exam: Vitals:   10/24/22 0316 10/24/22 0415 10/24/22 0500 10/24/22 0600  BP: 131/69 (!) 113/93 113/66 130/74  Pulse: 99 91 92 99  Resp: _0 Temp: 98.5 F (36.9 C)     TempSrc: Oral     SpO2: 99% 96% 97% 96%   General: 89 y.o. female resting in bed in NAD Eyes: PERRL, normal sclera ENMT: Nares patent w/o discharge, orophaynx clear, dentition normal, ears w/o discharge/lesions/ulcers Neck: Supple, trachea  midline Cardiovascular: RRR, +S1, S2, no m/g/r, equal pulses throughout Respiratory: CTABL, no w/r/r, normal WOB GI: BS+, NDNT, no masses noted, no organomegaly noted MSK: No c/c; RLE 2+ edema/TTP/warm to touch Neuro: A&O x 3, no focal deficits Psyc: Appropriate interaction and affect, calm/cooperative  Data Reviewed:  Results for orders placed or performed during the hospital encounter of 10/24/22 (from the past 24 hour(s))  Comprehensive metabolic panel     Status: Abnormal   Collection Time: 10/24/22  4:34 AM  Result Value Ref Range   Sodium 138 135 - 145 mmol/L   Potassium 4.3 3.5 - 5.1 mmol/L   Chloride 105 98 - 111 mmol/L   CO2 22 22 - 32 mmol/L   Glucose, Bld 118 (H) 70 - 99 mg/dL   BUN 49 (H) 8 - 23 mg/dL   Creatinine, Ser 0.71 0.44 - 1.00 mg/dL   Calcium 9.2 8.9 - 10.3 mg/dL   Total Protein 6.9 6.5 - 8.1 g/dL   Albumin 3.3 (L) 3.5 - 5.0 g/dL   AST 31 15 - 41 U/L   ALT 20 0 - 44 U/L   Alkaline Phosphatase 39 38 - 126 U/L   Total Bilirubin 1.2 0.3 - 1.2 mg/dL   GFR, Estimated >60 >60 mL/min   Anion gap 11 5 - 15  CBC with Differential     Status: Abnormal   Collection Time: 10/24/22  4:34 AM  Result Value Ref Range   WBC 13.4 (H) 4.0 - 10.5 K/uL   RBC 4.57 3.87 - 5.11 MIL/uL   Hemoglobin 14.2 12.0 - 15.0 g/dL   HCT 42.9 36.0 - 46.0 %   MCV 93.9 80.0 - 100.0 fL   MCH 31.1 26.0 - 34.0 pg   MCHC 33.1 30.0 - 36.0 g/dL   RDW 15.0 11.5 - 15.5 %   Platelets 188 150 - 400 K/uL   nRBC 0.0 0.0 - 0.2 %   Neutrophils Relative % 79 %   Neutro Abs 10.7 (H) 1.7 - 7.7 K/uL   Lymphocytes Relative 7 %   Lymphs Abs 0.9 0.7 - 4.0 K/uL   Monocytes Relative 11 %   Monocytes Absolute 1.5 (H) 0.1 - 1.0 K/uL   Eosinophils Relative 0 %   Eosinophils Absolute 0.0 0.0 - 0.5 K/uL   Basophils Relative 1 %   Basophils Absolute 0.1 0.0 - 0.1 K/uL   Immature Granulocytes 2 %   Abs Immature Granulocytes 0.27 (H) 0.00 - 0.07 K/uL  Urinalysis, Routine w reflex microscopic Urine, Clean Catch      Status: Abnormal   Collection Time: 10/24/22  4:40 AM  Result Value Ref Range   Color, Urine YELLOW YELLOW   APPearance CLEAR CLEAR   Specific Gravity, Urine 1.015 1.005 - 1.030  pH 7.0 5.0 - 8.0   Glucose, UA NEGATIVE NEGATIVE mg/dL   Hgb urine dipstick MODERATE (A) NEGATIVE   Bilirubin Urine NEGATIVE NEGATIVE   Ketones, ur NEGATIVE NEGATIVE mg/dL   Protein, ur NEGATIVE NEGATIVE mg/dL   Nitrite NEGATIVE NEGATIVE   Leukocytes,Ua TRACE (A) NEGATIVE   RBC / HPF 0-5 0 - 5 RBC/hpf   WBC, UA 0-5 0 - 5 WBC/hpf   Bacteria, UA NONE SEEN NONE SEEN   Squamous Epithelial / HPF 0-5 0 - 5 /HPF   CTH: CT C-spine: 1. No evidence of acute intracranial or cervical spine injury. 2. Posterior scalp laceration without calvarial fracture.  XR Pelvis: 1. Left hip bipolar hemiarthroplasty. No acute fracture or dislocation.  CTA Chest PE: 1. Positive for lobar and segmental pulmonary embolism in the left lower lobe. Smaller embolism at right upper lobe segmental level. 2. Patchy airspace disease with nodules and mild bronchiectasis, suspect a combination of chronic lung disease (such as MAC) and acute pneumonia. If appropriate for comorbidities a chest CT in 2-3 months may be useful.  Assessment and Plan: Acute PE RLE DVT     - place in obs, tele     - CTA Chest pos for PE     - dopplers positive for RLE DVT     - continue heparin for now; discussed oral AC w/ daughter; she is weighing options     - PESI is 90; warrants obs  Multifocal PNA     - continue CAP coverage     - check urine legionella/strep     - guaifenesin  Fall     - PT/OT eval     - CTH/CT c-spine ok  Glaucoma     - continue home regimen when confirmed  Severe protein calorie malnutrition     - dietitian consult  Advance Care Planning:   Code Status: DNR, confirmed w/ daughter at bedside  Consults: None  Family Communication: w/ daughter at bedside  Severity of Illness: The appropriate patient status for  this patient is OBSERVATION. Observation status is judged to be reasonable and necessary in order to provide the required intensity of service to ensure the patient's safety. The patient's presenting symptoms, physical exam findings, and initial radiographic and laboratory data in the context of their medical condition is felt to place them at decreased risk for further clinical deterioration. Furthermore, it is anticipated that the patient will be medically stable for discharge from the hospital within 2 midnights of admission.   Author: Jonnie Finner, DO 10/24/2022 7:22 AM  For on call review [email or web address].

## 2022-10-24 NOTE — Progress Notes (Signed)
Initial Nutrition Assessment  DOCUMENTATION CODES:   Underweight  INTERVENTION:  - Regular diet.  - Automatic house trays to ensure patient receives 3 meals a day.  - Ensure Plus High Protein po TID, each supplement provides 350 kcal and 20 grams of protein. - Encourage intake at all meals and of supplements.  - Restart home Megace if medically appropriate for appetite stimulation. - Daily multivitamin to support micronutrient needs.   - Monitor weight trends.   NUTRITION DIAGNOSIS:   Underweight related to chronic illness as evidenced by other (comment) (BMI 17.17).  GOAL:   Patient will meet greater than or equal to 90% of their needs  MONITOR:   PO intake, Supplement acceptance, Weight trends  REASON FOR ASSESSMENT:   Consult Assessment of nutrition requirement/status  ASSESSMENT:   89 y.o. female with medical history significant of glaucoma who presented after a fall. Admitted for acute PE and RLE DVT and multifocal PNA.   Per chart review no weight history since 2018 to assess recent changes. Spoke with patient's daughter who reports patient was weighing 81# but has been able to gain to 85# recently with eating better and drinking Ensures.   Daughter reports family tries to get patient to eat around 2 meals a day but she often eats very little. She does drink around 2 Ensures daily at home. Daughter reports her doctor also put her on a medication to help increase her appetite (Megace per home medications list) which has seemed to help.   Daughter reports her mom would likely benefit from receiving automatic house trays to ensure she receives 3 meals a day. Also agreeable for the patient to receive 3 Ensures daily during admission to support intake.    Medications reviewed and include: -  Labs reviewed:  -   NUTRITION - FOCUSED PHYSICAL EXAM:  RD working remotely  Diet Order:   Diet Order             Diet regular Room service appropriate? Yes; Fluid  consistency: Thin  Diet effective now                   EDUCATION NEEDS:  No education needs have been identified at this time  Skin:  Skin Assessment: Reviewed RN Assessment  Last BM:  PTA  Height:  Ht Readings from Last 1 Encounters:  10/24/22 4\' 11"  (1.499 m)   Weight:  Wt Readings from Last 1 Encounters:  10/24/22 38.6 kg    BMI:  Body mass index is 17.17 kg/m.  Estimated Nutritional Needs:  Kcal:  1550-1750 kcals Protein:  65-75 grams Fluid:  >/= 1.5L    Samson Frederic RD, LDN For contact information, refer to [facility].

## 2022-10-24 NOTE — ED Provider Notes (Signed)
[facility] DEPT Provider Note   CSN: [id number] Arrival date & time: 10/24/22  0300     History  Chief Complaint  Patient presents with   Julia Hampton is a 89 y.o. female.  The history is provided by the patient and medical records.  Fall  Julia Hampton is a 89 y.o. female who presents to the Emergency Department complaining of fall.  She presents to the emergency department by EMS from home for evaluation of injuries following an unwitnessed fall.  Level 5 caveat due to confusion.  History is provided by EMS and the patient.  She was sitting on the toilet and had a fall.  Patient denies any complaints of pain.  She is unsure of what her medical issues are.  She lives at home with her grandson.  She denies any recent illnesses.  Additional history available from patient's daughter at the bedside after patient's initial ED presentation.  Daughter states that the patient got up from the commode and was leaving the bathroom when she had a fall.  It was witnessed.  Daughter also reports that the patient has had a decline in her status lately with decreased oral intake and has been needing increased assistance at home.  She did recently go to her PCP to evaluate for options for increased assistance.  She reports right lower extremity swelling for about 1 week.     Home Medications Prior to Admission medications   Medication Sig Start Date End Date Taking? Authorizing Provider  acetaminophen (TYLENOL) 500 MG tablet Take 500 mg by mouth every 6 (six) hours as needed.    [provider]  Ascorbic Acid (VITAMIN C PO) Take by mouth.    [provider]  aspirin EC 81 MG tablet Take 81 mg by mouth daily.    [provider]  bimatoprost (LUMIGAN) 0.01 % SOLN Place 1 drop into both eyes at bedtime.    [provider]  bismuth subsalicylate (PEPTO-BISMOL) 262 MG/15ML suspension Take 30 mLs by mouth every 6 (six) hours as  needed. 01/22/17   Leo Grosser, MD  brinzolamide (AZOPT) 1 % ophthalmic suspension Place 1 drop into both eyes 2 (two) times daily.     [provider]  feeding supplement, ENSURE ENLIVE, (ENSURE ENLIVE) LIQD Take 237 mLs by mouth 3 (three) times daily between meals. 01/17/17   Lavina Hamman, MD  guaiFENesin (MUCINEX) 600 MG 12 hr tablet Take 1 tablet (600 mg total) by mouth 2 (two) times daily. 01/17/17   Lavina Hamman, MD  Multiple Vitamin (MULTIVITAMIN WITH MINERALS) TABS tablet Take 1 tablet by mouth daily.    [provider]  polycarbophil (FIBERCON) 625 MG tablet Take 1 tablet (625 mg total) by mouth daily. 01/22/17   Leo Grosser, MD  polyethylene glycol [facility] / Floria Raveling) packet Take 17 g by mouth daily as needed for mild constipation. 01/17/17   Lavina Hamman, MD  saccharomyces boulardii (FLORASTOR) 250 MG capsule Take 1 capsule (250 mg total) by mouth 2 (two) times daily. 01/17/17   Lavina Hamman, MD  timolol (TIMOPTIC) 0.5 % ophthalmic solution Place 1 drop into both eyes daily after lunch.    [provider]      Allergies    Other    Review of Systems   Review of Systems  All other systems reviewed and are negative.   Physical Exam Updated Vital Signs BP 118/66 (BP Location: Left Arm)  Pulse 91   Temp 98.3 F (36.8 C) (Oral)   Resp 15   SpO2 95%  Physical Exam Vitals and nursing note reviewed.  Constitutional:      Appearance: She is well-developed.  HENT:     Head: Normocephalic.     Comments: 3-1/2 cm vertical laceration to the back of the head Cardiovascular:     Rate and Rhythm: Regular rhythm. Tachycardia present.     Heart sounds: No murmur heard. Pulmonary:     Effort: Pulmonary effort is normal. No respiratory distress.     Breath sounds: Normal breath sounds.  Abdominal:     Palpations: Abdomen is soft.     Tenderness: There is no abdominal tenderness. There is no guarding or rebound.  Musculoskeletal:     Comments: No  tenderness to palpation on the hips bilaterally.  There is soft tissue swelling to the right calf and ankle with pitting edema.  2+ DP pulses bilaterally.  No significant tenderness to palpation over the ankle.  Skin:    General: Skin is warm and dry.  Neurological:     Mental Status: She is alert.     Comments: Oriented to person, hospital.  Disoriented to time and recent events.  Moves all extremities symmetrically but weakly  Psychiatric:        Behavior: Behavior normal.     ED Results / Procedures / Treatments   Labs (all labs ordered are listed, but only abnormal results are displayed) Labs Reviewed  COMPREHENSIVE METABOLIC PANEL - Abnormal; Notable for the following components:      Result Value   Glucose, Bld 118 (*)    BUN 49 (*)    Albumin 3.3 (*)    All other components within normal limits  CBC WITH DIFFERENTIAL/PLATELET - Abnormal; Notable for the following components:   WBC 13.4 (*)    Neutro Abs 10.7 (*)    Monocytes Absolute 1.5 (*)    Abs Immature Granulocytes 0.27 (*)    All other components within normal limits  URINALYSIS, ROUTINE W REFLEX MICROSCOPIC - Abnormal; Notable for the following components:   Hgb urine dipstick MODERATE (*)    Leukocytes,Ua TRACE (*)    All other components within normal limits  CULTURE, BLOOD (ROUTINE X 2)  CULTURE, BLOOD (ROUTINE X 2)  PROTIME-INR  LACTIC ACID, PLASMA  LACTIC ACID, PLASMA  BRAIN NATRIURETIC PEPTIDE  TROPONIN I (HIGH SENSITIVITY)    EKG EKG Interpretation  Date/Time:  Saturday October 24 2022 04:31:58 EST Ventricular Rate:  103 PR Interval:  142 QRS Duration: 83 QT Interval:  361 QTC Calculation: 473 R Axis:   56 Text Interpretation: Sinus tachycardia Artifact in lead(s) I II III aVR aVL aVF Confirmed by Quintella Reichert [phone]) on 10/24/2022 6:57:50 AM  Radiology CT Angio Chest PE W/Cm &/Or Wo Cm  Result Date: 10/24/2022 CLINICAL DATA:  Fall from toilet. EXAM: CT ANGIOGRAPHY CHEST WITH CONTRAST  TECHNIQUE: Multidetector CT imaging of the chest was performed using the standard protocol during bolus administration of intravenous contrast. Multiplanar CT image reconstructions and MIPs were obtained to evaluate the vascular anatomy. RADIATION DOSE REDUCTION: This exam was performed according to the departmental dose-optimization program which includes automated exposure control, adjustment of the mA and/or kV according to patient size and/or use of iterative reconstruction technique. CONTRAST:  87m OMNIPAQUE IOHEXOL 350 MG/ML SOLN COMPARISON:  Report from outside hospital 08/03/2021 FINDINGS: Cardiovascular: Satisfactory opacification of the pulmonary arteries to the segmental level. Branching pulmonary embolism within segmental  and left lower lobar pulmonary arteries, nonocclusive. Clot also seen within segmental branches of the right upper lobe, possibly subacute given the somewhat peripheral appearance. Insufficient visible clot burden for right heart strain. Atheromatous calcification of the aorta. Mediastinum/Nodes: Unremarkable esophagus.  No adenopathy Lungs/Pleura: Reticular and nodular opacities in the lingula and lower lobes with areas of mucoid airway impaction. Mild cylindrical bronchiectasis in the lingula. Nodules are mainly clustered and inflammatory pattern, measuring up to 11 mm in the right lower lobe on 11:62. Area of confluent consolidation in the subpleural left lower lobe, see 11:67. Biapical pleural based scarring. Upper Abdomen: No acute finding. 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 Cholecystectomy which accounts for bile duct prominence. Musculoskeletal: Degenerative change without acute finding. Review of the MIP images confirms the above findings. Critical Value/emergent results were called by telephone at the time of interpretation on 10/24/2022 at 6:44 am to provider [facility] , who verbally acknowledged these results. IMPRESSION: 1. Positive for  lobar and segmental pulmonary embolism in the left lower lobe. Smaller embolism at right upper lobe segmental level. 2. Patchy airspace disease with nodules and mild bronchiectasis, suspect a combination of chronic lung disease (such as MAC) and acute pneumonia. If appropriate for comorbidities a chest CT in 2-3 months may be useful. Electronically Signed   By: Jorje Guild M.D.   On: 10/24/2022 06:50   DG Pelvis Portable  Result Date: 10/24/2022 CLINICAL DATA:  Fall EXAM: PORTABLE PELVIS 1-2 VIEWS COMPARISON:  None Available. FINDINGS: Status post left hip bipolar hemiarthroplasty. Normal alignment. No acute fracture or dislocation. Moderate right hip degenerative arthritis. Soft tissues are unremarkable. IMPRESSION: 1. Left hip bipolar hemiarthroplasty. No acute fracture or dislocation. Electronically Signed   By: Fidela Salisbury M.D.   On: 10/24/2022 04:06   DG Chest Port 1 View  Result Date: 10/24/2022 CLINICAL DATA:  Fall EXAM: PORTABLE CHEST 1 VIEW COMPARISON:  01/22/2017 FINDINGS: The lungs appear mildly hyperinflated suggesting changes of underlying COPD. Biapical parenchymal scarring noted. Multiple amorphous opacities overlying the cardiac silhouette likely represent costochondral calcification. No pneumothorax or pleural effusion. Cardiac size within normal limits. Pulmonary vascularity is normal. No acute bone abnormality. IMPRESSION: 1. No active disease. COPD. Electronically Signed   By: Fidela Salisbury M.D.   On: 10/24/2022 04:04   CT Head Wo Contrast  Result Date: 10/24/2022 CLINICAL DATA:  Fall with head is EXAM: CT HEAD WITHOUT CONTRAST CT CERVICAL SPINE WITHOUT CONTRAST TECHNIQUE: Multidetector CT imaging of the head and cervical spine was performed following the standard protocol without intravenous contrast. Multiplanar CT image reconstructions of the cervical spine were also generated. RADIATION DOSE REDUCTION: This exam was performed according to the departmental dose-optimization  program which includes automated exposure control, adjustment of the mA and/or kV according to patient size and/or use of iterative reconstruction technique. COMPARISON:  None Available. FINDINGS: CT HEAD FINDINGS Brain: No evidence of acute infarction, hemorrhage, hydrocephalus, extra-axial collection or mass lesion/mass effect. Generalized atrophy Vascular: No hyperdense vessel or unexpected calcification. Skull: Posterior scalp laceration.  No calvarial fracture. Sinuses/Orbits: No evidence of injury CT CERVICAL SPINE FINDINGS Alignment: Hyperlordosis Skull base and vertebrae: Generalized osteopenia. No acute fracture or incidental bone lesion Soft tissues and spinal canal: No prevertebral fluid or swelling. No visible canal hematoma. Disc levels:  Ordinary degenerative change for age Upper chest: Biapical pleural based scarring. IMPRESSION: 1. No evidence of acute intracranial or cervical spine injury. 2. Posterior scalp laceration without calvarial fracture. Electronically Signed  By: Jorje Guild M.D.   On: 10/24/2022 03:58   CT Cervical Spine Wo Contrast  Result Date: 10/24/2022 CLINICAL DATA:  Fall with head is EXAM: CT HEAD WITHOUT CONTRAST CT CERVICAL SPINE WITHOUT CONTRAST TECHNIQUE: Multidetector CT imaging of the head and cervical spine was performed following the standard protocol without intravenous contrast. Multiplanar CT image reconstructions of the cervical spine were also generated. RADIATION DOSE REDUCTION: This exam was performed according to the departmental dose-optimization program which includes automated exposure control, adjustment of the mA and/or kV according to patient size and/or use of iterative reconstruction technique. COMPARISON:  None Available. FINDINGS: CT HEAD FINDINGS Brain: No evidence of acute infarction, hemorrhage, hydrocephalus, extra-axial collection or mass lesion/mass effect. Generalized atrophy Vascular: No hyperdense vessel or unexpected calcification. Skull:  Posterior scalp laceration.  No calvarial fracture. Sinuses/Orbits: No evidence of injury CT CERVICAL SPINE FINDINGS Alignment: Hyperlordosis Skull base and vertebrae: Generalized osteopenia. No acute fracture or incidental bone lesion Soft tissues and spinal canal: No prevertebral fluid or swelling. No visible canal hematoma. Disc levels:  Ordinary degenerative change for age Upper chest: Biapical pleural based scarring. IMPRESSION: 1. No evidence of acute intracranial or cervical spine injury. 2. Posterior scalp laceration without calvarial fracture. Electronically Signed   By: Jorje Guild M.D.   On: 10/24/2022 03:58    Procedures .Marland KitchenLaceration Repair  Date/Time: 10/24/2022 7:25 AM  Performed by: Quintella Reichert, MD Authorized by: Quintella Reichert, MD   Consent:    Consent obtained:  Verbal   Consent given by:  Patient   Risks discussed:  Infection, pain and poor cosmetic result Universal protocol:    Patient identity confirmed:  Verbally with patient Anesthesia:    Anesthesia method:  Topical application   Topical anesthetic:  LET Laceration details:    Location:  Scalp   Scalp location:  Occipital   Length (cm):  3.5 Exploration:    Hemostasis achieved with:  Direct pressure   Imaging outcome: foreign body not noted     Contaminated: no   Treatment:    Area cleansed with:  Chlorhexidine and saline   Amount of cleaning:  Standard   Irrigation solution:  Sterile saline   Debridement:  None   Undermining:  None Skin repair:    Repair method:  Staples   Number of staples:  3 Approximation:    Approximation:  Close Repair type:    Repair type:  Simple Post-procedure details:    Dressing:  Open (no dressing)   Procedure completion:  Tolerated well, no immediate complications .Critical Care  Performed by: Quintella Reichert, MD Authorized by: Quintella Reichert, MD   Critical care provider statement:    Critical care time (minutes):  35   Critical care time was exclusive of:   Separately billable procedures and treating other patients   Critical care was necessary to treat or prevent imminent or life-threatening deterioration of the following conditions:  Cardiac failure and respiratory failure   Critical care was time spent personally by me on the following activities:  Development of treatment plan with patient or surrogate, discussions with consultants, evaluation of patient's response to treatment, examination of patient, ordering and review of laboratory studies, ordering and review of radiographic studies, ordering and performing treatments and interventions, pulse oximetry, re-evaluation of patient's condition and review of old charts   Care discussed with: admitting provider       Medications Ordered in ED Medications  cefTRIAXone (ROCEPHIN) 1 g in sodium chloride 0.9 % 100 mL IVPB (  has no administration in time range)  azithromycin (ZITHROMAX) 500 mg in sodium chloride 0.9 % 250 mL IVPB (has no administration in time range)  lidocaine-EPINEPHrine-tetracaine (LET) topical gel (3 mLs Topical Given 10/24/22 0439)  sodium chloride 0.9 % bolus 500 mL (500 mLs Intravenous New Bag/Given 10/24/22 0559)  iohexol (OMNIPAQUE) 350 MG/ML injection 75 mL (75 mLs Intravenous Contrast Given 10/24/22 0617)    ED Course/ Medical Decision Making/ A&P                           Medical Decision Making Amount and/or Complexity of Data Reviewed Labs: ordered. Radiology: ordered.  Risk Prescription drug management.   Patient here for evaluation following a fall.  She has a laceration to the back of her head, repaired per procedure note.  CT head and neck are negative for acute intracranial or cervical abnormality.  Patient is noted to be tachycardic, has right lower extremity edema.  She denies any shortness of breath, unknown if she had a syncopal episode.  Concern for possible DVT/PE given her exam findings and the recent fall.  CTA was obtained, which does demonstrate pulmonary  embolism as well as possible acute pneumonia.  Will start on antibiotics and send cultures.  Will also start on heparin for acute PE and concern for DVT.  Discussed with patient and daughter findings of studies and recommendation for admission and they are in agreement with plan.  Hospitalist consulted for admission.        Final Clinical Impression(s) / ED Diagnoses Final diagnoses:  Other acute pulmonary embolism without acute cor pulmonale ([place])  Fall, initial encounter  Laceration of scalp, initial encounter    Rx / DC Orders ED Discharge Orders     None         Quintella Reichert, MD 10/24/22 [phone]

## 2022-10-24 NOTE — Progress Notes (Signed)
ANTICOAGULATION CONSULT NOTE - Initial Consult  Pharmacy Consult for IV Heparin  Indication: pulmonary embolus  Allergies  Allergen Reactions   Other Other (See Comments)    Glaucoma drop with a dark green top - not sure of name -    Patient Measurements: Height: 4\' 11"  (149.9 cm) Weight: 38.6 kg (85 lb) IBW/kg (Calculated) : 43.2 Heparin Dosing Weight: 38.6  Vital Signs: Temp: 98.3 F (36.8 C) (01/06 0722) Temp Source: Oral (01/06 0722) BP: 118/66 (01/06 0722) Pulse Rate: 91 (01/06 0722)  Labs: Recent Labs    10/24/22 0434  HGB 14.2  HCT 42.9  PLT 188  CREATININE 0.71    Estimated Creatinine Clearance: 28.5 mL/min (by C-G formula based on SCr of 0.71 mg/dL).   Medical History: Past Medical History:  Diagnosis Date   Complication of anesthesia    trouble waking patient up   Glaucoma     Medications:  Scheduled:  Infusions:   azithromycin (ZITHROMAX) 500 mg in sodium chloride 0.9 % 250 mL IVPB     cefTRIAXone (ROCEPHIN)  IV      Assessment: 89 yo presented to ER with CC fall found to have new PE. To start IV Heparin. Not on any anticoagulation prior to admission. Baseline labs already completed  Goal of Therapy:  Heparin level 0.3-0.7 units/ml Monitor platelets by anticoagulation protocol: Yes   Plan:  IV Heparin 1000 unit bolus then IV Heparin rate of 600 units/hr Check heparin level 8 hours after starting IV heparin Daily CBC  Kara Mead 10/24/2022,7:58 AM

## 2022-10-25 ENCOUNTER — Observation Stay (HOSPITAL_COMMUNITY): Payer: Medicare PPO

## 2022-10-25 DIAGNOSIS — Z1152 Encounter for screening for COVID-19: Secondary | ICD-10-CM | POA: Diagnosis not present

## 2022-10-25 DIAGNOSIS — R636 Underweight: Secondary | ICD-10-CM

## 2022-10-25 DIAGNOSIS — W19XXXA Unspecified fall, initial encounter: Secondary | ICD-10-CM

## 2022-10-25 DIAGNOSIS — H409 Unspecified glaucoma: Secondary | ICD-10-CM | POA: Diagnosis present

## 2022-10-25 DIAGNOSIS — I82401 Acute embolism and thrombosis of unspecified deep veins of right lower extremity: Secondary | ICD-10-CM | POA: Diagnosis not present

## 2022-10-25 DIAGNOSIS — I2693 Single subsegmental pulmonary embolism without acute cor pulmonale: Secondary | ICD-10-CM

## 2022-10-25 DIAGNOSIS — I10 Essential (primary) hypertension: Secondary | ICD-10-CM | POA: Diagnosis present

## 2022-10-25 DIAGNOSIS — Z820 Family history of epilepsy and other diseases of the nervous system: Secondary | ICD-10-CM | POA: Diagnosis not present

## 2022-10-25 DIAGNOSIS — Z66 Do not resuscitate: Secondary | ICD-10-CM | POA: Diagnosis present

## 2022-10-25 DIAGNOSIS — I82441 Acute embolism and thrombosis of right tibial vein: Secondary | ICD-10-CM | POA: Diagnosis present

## 2022-10-25 DIAGNOSIS — I82431 Acute embolism and thrombosis of right popliteal vein: Secondary | ICD-10-CM | POA: Diagnosis present

## 2022-10-25 DIAGNOSIS — Z681 Body mass index (BMI) 19 or less, adult: Secondary | ICD-10-CM | POA: Diagnosis not present

## 2022-10-25 DIAGNOSIS — I2699 Other pulmonary embolism without acute cor pulmonale: Secondary | ICD-10-CM | POA: Diagnosis present

## 2022-10-25 DIAGNOSIS — S0101XA Laceration without foreign body of scalp, initial encounter: Secondary | ICD-10-CM | POA: Diagnosis present

## 2022-10-25 DIAGNOSIS — R627 Adult failure to thrive: Secondary | ICD-10-CM

## 2022-10-25 DIAGNOSIS — I82451 Acute embolism and thrombosis of right peroneal vein: Secondary | ICD-10-CM | POA: Diagnosis present

## 2022-10-25 DIAGNOSIS — J189 Pneumonia, unspecified organism: Secondary | ICD-10-CM | POA: Diagnosis present

## 2022-10-25 DIAGNOSIS — Z96642 Presence of left artificial hip joint: Secondary | ICD-10-CM | POA: Diagnosis present

## 2022-10-25 DIAGNOSIS — Z90710 Acquired absence of both cervix and uterus: Secondary | ICD-10-CM | POA: Diagnosis not present

## 2022-10-25 DIAGNOSIS — Z79899 Other long term (current) drug therapy: Secondary | ICD-10-CM | POA: Diagnosis not present

## 2022-10-25 DIAGNOSIS — Y92002 Bathroom of unspecified non-institutional (private) residence single-family (private) house as the place of occurrence of the external cause: Secondary | ICD-10-CM | POA: Diagnosis not present

## 2022-10-25 DIAGNOSIS — F0390 Unspecified dementia without behavioral disturbance: Secondary | ICD-10-CM | POA: Diagnosis present

## 2022-10-25 DIAGNOSIS — Z8249 Family history of ischemic heart disease and other diseases of the circulatory system: Secondary | ICD-10-CM | POA: Diagnosis not present

## 2022-10-25 DIAGNOSIS — W1839XA Other fall on same level, initial encounter: Secondary | ICD-10-CM | POA: Diagnosis present

## 2022-10-25 DIAGNOSIS — I82411 Acute embolism and thrombosis of right femoral vein: Secondary | ICD-10-CM | POA: Diagnosis present

## 2022-10-25 DIAGNOSIS — Z7982 Long term (current) use of aspirin: Secondary | ICD-10-CM | POA: Diagnosis not present

## 2022-10-25 LAB — ECHOCARDIOGRAM COMPLETE
Area-P 1/2: 2.71 cm2
Calc EF: 80.8 %
Height: 59 in
MV M vel: 1.85 m/s
MV Peak grad: 13.7 mmHg
MV VTI: 3.37 cm2
S' Lateral: 1.77 cm
Single Plane A2C EF: 84.6 %
Single Plane A4C EF: 77.3 %
Weight: 1360 oz

## 2022-10-25 LAB — CBC
HCT: 38.2 % (ref 36.0–46.0)
Hemoglobin: 12.2 g/dL (ref 12.0–15.0)
MCH: 31.2 pg (ref 26.0–34.0)
MCHC: 31.9 g/dL (ref 30.0–36.0)
MCV: 97.7 fL (ref 80.0–100.0)
Platelets: 222 10*3/uL (ref 150–400)
RBC: 3.91 MIL/uL (ref 3.87–5.11)
RDW: 15.4 % (ref 11.5–15.5)
WBC: 10.4 10*3/uL (ref 4.0–10.5)
nRBC: 0 % (ref 0.0–0.2)

## 2022-10-25 LAB — HEPARIN LEVEL (UNFRACTIONATED)
Heparin Unfractionated: 0.36 IU/mL (ref 0.30–0.70)
Heparin Unfractionated: 0.59 IU/mL (ref 0.30–0.70)

## 2022-10-25 LAB — COMPREHENSIVE METABOLIC PANEL
ALT: 17 U/L (ref 0–44)
AST: 30 U/L (ref 15–41)
Albumin: 2.9 g/dL — ABNORMAL LOW (ref 3.5–5.0)
Alkaline Phosphatase: 34 U/L — ABNORMAL LOW (ref 38–126)
Anion gap: 8 (ref 5–15)
BUN: 36 mg/dL — ABNORMAL HIGH (ref 8–23)
CO2: 21 mmol/L — ABNORMAL LOW (ref 22–32)
Calcium: 8.8 mg/dL — ABNORMAL LOW (ref 8.9–10.3)
Chloride: 112 mmol/L — ABNORMAL HIGH (ref 98–111)
Creatinine, Ser: 0.79 mg/dL (ref 0.44–1.00)
GFR, Estimated: >60 mL/min (ref >60)
Glucose, Bld: 173 mg/dL — ABNORMAL HIGH (ref 70–99)
Potassium: 3.7 mmol/L (ref 3.5–5.1)
Sodium: 141 mmol/L (ref 135–145)
Total Bilirubin: 0.9 mg/dL (ref 0.3–1.2)
Total Protein: 6.4 g/dL — ABNORMAL LOW (ref 6.5–8.1)

## 2022-10-25 LAB — PROCALCITONIN: Procalcitonin: 0.34 ng/mL

## 2022-10-25 LAB — STREP PNEUMONIAE URINARY ANTIGEN: Strep Pneumo Urinary Antigen: NEGATIVE

## 2022-10-25 MED ORDER — SACCHAROMYCES BOULARDII 250 MG PO CAPS
250.0000 mg | ORAL_CAPSULE | Freq: Two times a day (BID) | ORAL | Status: DC
  Administered 2022-10-25 – 2022-10-27 (×5): 250 mg via ORAL
  Filled 2022-10-25 (×5): qty 1

## 2022-10-25 MED ORDER — MEGESTROL ACETATE 625 MG/5ML PO SUSP: 625.0000 mg | Freq: Every day | ORAL | Status: DC

## 2022-10-25 MED ORDER — BRIMONIDINE TARTRATE 0.2 % OP SOLN
1.0000 [drp] | Freq: Two times a day (BID) | OPHTHALMIC | Status: DC
  Administered 2022-10-25 – 2022-10-30 (×11): 1 [drp] via OPHTHALMIC
  Filled 2022-10-25: qty 5

## 2022-10-25 MED ORDER — PERFLUTREN LIPID MICROSPHERE
1.0000 mL | INTRAVENOUS | Status: AC | PRN
  Administered 2022-10-25: 2 mL via INTRAVENOUS

## 2022-10-25 MED ORDER — CITALOPRAM HYDROBROMIDE 20 MG PO TABS
20.0000 mg | ORAL_TABLET | Freq: Every day | ORAL | Status: DC
  Administered 2022-10-25 – 2022-10-27 (×3): 20 mg via ORAL
  Filled 2022-10-25 (×3): qty 1

## 2022-10-25 MED ORDER — LATANOPROST 0.005 % OP SOLN
1.0000 [drp] | Freq: Every day | OPHTHALMIC | Status: DC
  Administered 2022-10-25 – 2022-10-29 (×5): 1 [drp] via OPHTHALMIC
  Filled 2022-10-25: qty 2.5

## 2022-10-25 MED ORDER — LACTATED RINGERS IV SOLN: INTRAVENOUS | Status: DC

## 2022-10-25 MED ORDER — MEGESTROL ACETATE 400 MG/10ML PO SUSP
400.0000 mg | Freq: Every day | ORAL | Status: DC
  Administered 2022-10-25 – 2022-10-26 (×2): 400 mg via ORAL
  Filled 2022-10-25 (×2): qty 10

## 2022-10-25 MED ORDER — LATANOPROST 0.005 % OP SOLN
1.0000 [drp] | Freq: Every day | OPHTHALMIC | Status: DC
  Filled 2022-10-25: qty 2.5

## 2022-10-25 MED ORDER — DORZOLAMIDE HCL-TIMOLOL MAL 2-0.5 % OP SOLN
1.0000 [drp] | Freq: Two times a day (BID) | OPHTHALMIC | Status: DC
  Administered 2022-10-25 – 2022-10-26 (×3): 1 [drp] via OPHTHALMIC
  Filled 2022-10-25: qty 10

## 2022-10-25 MED ORDER — PILOCARPINE HCL 4 % OP SOLN
1.0000 [drp] | Freq: Two times a day (BID) | OPHTHALMIC | Status: DC
  Administered 2022-10-25 – 2022-10-30 (×11): 1 [drp] via OPHTHALMIC
  Filled 2022-10-25: qty 15

## 2022-10-25 NOTE — Progress Notes (Signed)
  Echocardiogram 2D Echocardiogram has been performed.  Julia Hampton 10/25/2022, 10:21 AM

## 2022-10-25 NOTE — Progress Notes (Signed)
ANTICOAGULATION CONSULT NOTE - Follow Up Consult  Pharmacy Consult for Heparin Indication:  DVT/ PE  Allergies  Allergen Reactions   Other Other (See Comments)    Glaucoma drop with a dark green top - not sure of name -    Patient Measurements: Height: 4\' 11"  (149.9 cm) Weight: 38.6 kg (85 lb) IBW/kg (Calculated) : 43.2 Heparin Dosing Weight:38.6 kg  Vital Signs: Temp: 98.6 F (37 C) (01/06 2157) Temp Source: Oral (01/06 2157) BP: 112/66 (01/06 2157) Pulse Rate: 88 (01/06 2157)  Labs: Recent Labs    10/24/22 0434 10/24/22 0750 10/24/22 1558 10/25/22 0052  HGB 14.2  --   --   --   HCT 42.9  --   --   --   PLT 188  --   --   --   LABPROT  --  15.3*  --   --   INR  --  1.2  --   --   HEPARINUNFRC  --   --  0.23* 0.36  CREATININE 0.71  --   --   --   TROPONINIHS  --  12  --   --      Estimated Creatinine Clearance: 28.5 mL/min (by C-G formula based on SCr of 0.71 mg/dL).  Assessment: 89 yo with new PE. Not on any anticoagulation prior to admission. Pharmacy consulted to dose IV heparin.  Baseline CBC WNL.   10/25/2022: Heparin level 0.36- therapeutic on IV heparin 700 units/hr CBC: Hg/pltc WNL No bleeding or infusion related interruptions reported by RN  Goal of Therapy:  Heparin level 0.3-0.7 units/ml Monitor platelets by anticoagulation protocol: Yes   Plan:  Continue IV heparin at 700 units/hr Recheck confirmatory level in 8 hrs. Daily HL and CBC  Netta Cedars PharmD, BCPS 10/25/2022,3:08 AM

## 2022-10-25 NOTE — Progress Notes (Signed)
ANTICOAGULATION CONSULT NOTE - Follow Up Consult  Pharmacy Consult for Heparin Indication:  DVT/ PE  Allergies  Allergen Reactions   Other Other (See Comments)    Glaucoma drop with a dark green top - not sure of name -    Patient Measurements: Height: 4\' 11"  (149.9 cm) Weight: 38.6 kg (85 lb) IBW/kg (Calculated) : 43.2 Heparin Dosing Weight:38.6 kg  Vital Signs: Temp: 98.9 F (37.2 C) (01/07 0409) Temp Source: Oral (01/07 0409) BP: 110/77 (01/07 0409) Pulse Rate: 82 (01/07 0409)  Labs: Recent Labs    10/24/22 0434 10/24/22 0750 10/24/22 1558 10/25/22 0052 10/25/22 0901  HGB 14.2  --   --   --  12.2  HCT 42.9  --   --   --  38.2  PLT 188  --   --   --  222  LABPROT  --  15.3*  --   --   --   INR  --  1.2  --   --   --   HEPARINUNFRC  --   --  0.23* 0.36 0.59  CREATININE 0.71  --   --   --  0.79  TROPONINIHS  --  12  --   --   --      Estimated Creatinine Clearance: 28.5 mL/min (by C-G formula based on SCr of 0.79 mg/dL).  Assessment: AC/Heme: New PE. RLE DVT  - Hep level 0.59 in goal. CBC remains WNL but watch Hgb 14.2>12.2. Plts WNL  Goal of Therapy:  Heparin level 0.3-0.7 units/ml Monitor platelets by anticoagulation protocol: Yes   Plan:  IV heparin at 700 units/hr Daily HL and CBC   Dolce Sylvia S. Alford Highland, PharmD, BCPS Clinical Staff Pharmacist [email or web address] Alford Highland, [employer] 10/25/2022,10:41 AM

## 2022-10-25 NOTE — Progress Notes (Signed)
PROGRESS NOTE    Julia Hampton  RXV:[id number] DOB: 1930-05-06 DOA: 10/24/2022 PCP: [facility] @ [place]     Brief Narrative:   H/o  glaucoma. Presenting with a fall. Her daughter reports that she has had 1 week of right leg swelling  FTT, progressive weakness, has been mostly sitting around all day for the last 20month due to weakness, she denies chest pain, no cough, no fever, she denies pain  Subjective:  She is tolerating heparin drip,  She is very weak but no acute complaints She wants to go home  Family at bedside   Assessment & Plan:  Principal Problem:   Acute pulmonary embolism ([facility] Problems:   Multifocal pneumonia   Protein-calorie malnutrition, severe   Glaucoma   Fall at home, initial encounter   DVT (deep venous thrombosis) ([place]   Pulmonary emboli ([place]    Assessment and Plan:   Acute PE (lobar and segmental pulmonary embolism in the left lower lobe. Smaller embolism at right upper lobe segmental level.)  RLE DVT     - has tachycardia, bp low normal, no hypoxia at rest, troponin negative  -echo pending  -  continue heparin for now; discussed oral AC w/ daughter; she is weighing options        Multifocal PNA? Though she denies respiratory symptom -CTA chest showed "Patchy airspace disease with nodules and mild bronchiectasis, suspect a combination of chronic lung disease (such as MAC) and acute pneumonia. If appropriate for comorbidities a chest CT in 2-3 months may be useful."      - continue CAP coverage     - urine  strep negative, urine legionella in process     -check procal   Fall/FTT/progressive weakness/Posterior scalp laceration without calvarial fracture on CT head -CTH/CT c-spine ok     - PT/OT eval     - laceration repaired with staplesx3 in the ED, to be removed in 7-14 days   Glaucoma     - continue home regimen   Impaired memory Not remember the month   Underweight      - dietitian consult    Nutritional Assessment: The patient's BMI is: Body mass index is 17.17 kg/m..Marland KitchenSeen by dietician.  I agree with the assessment and plan as outlined below: Nutrition Status: Nutrition Problem: Underweight Etiology: chronic illness Signs/Symptoms: other (comment) (BMI 17.17) Interventions: Ensure Enlive (each supplement provides 350kcal and 20 grams of protein), MVI, Refer to RD note for recommendations  .poor oral intake, nutrition supplement, start ivf      I have Reviewed nursing notes, Vitals, pain scores, I/o's, Lab results and  imaging results since pt's last encounter, details please see discussion above  I ordered the following labs:  Unresulted Labs (From admission, onward)     Start     Ordered   10/26/22 0500  Procalcitonin  Daily,   R      10/25/22 0915   10/26/22 0500  Heparin level (unfractionated)  Daily,   R      10/25/22 1042   10/26/22 05093 Basic metabolic panel  Tomorrow morning,   R        10/25/22 1346   10/25/22 0500  CBC  Daily,   R      10/24/22 0801   10/24/22 1057  Legionella Pneumophila Serogp 1 Ur Ag  (COPD / Pneumonia / Cellulitis / Lower Extremity Wound)  Once,   R        10/24/22 1056  DVT prophylaxis: on heparin drip   Code Status:   Code Status: DNR  Family Communication: daughter at bedside  Disposition:   Dispo: The patient is from: home              Anticipated d/c is to: home with [facility]              Anticipated d/c date is: 24-48hrs pending clinical improvement   Antimicrobials:    Anti-infectives (From admission, onward)    Start     Dose/Rate Route Frequency Ordered Stop   10/25/22 1000  cefTRIAXone (ROCEPHIN) 1 g in sodium chloride 0.9 % 100 mL IVPB        1 g 200 mL/hr over 30 Minutes Intravenous Every 24 hours 10/24/22 1056 10/30/22 0959   10/25/22 1000  azithromycin (ZITHROMAX) 500 mg in sodium chloride 0.9 % 250 mL IVPB        500 mg 250 mL/hr over 60 Minutes Intravenous Every 24 hours 10/24/22 1056  10/30/22 0959   10/24/22 0700  cefTRIAXone (ROCEPHIN) 1 g in sodium chloride 0.9 % 100 mL IVPB        1 g 200 mL/hr over 30 Minutes Intravenous  Once 10/24/22 0659 10/24/22 0919   10/24/22 0700  azithromycin (ZITHROMAX) 500 mg in sodium chloride 0.9 % 250 mL IVPB        500 mg 250 mL/hr over 60 Minutes Intravenous  Once 10/24/22 0659 10/24/22 0919          Objective: Vitals:   10/24/22 1411 10/24/22 1846 10/24/22 2157 10/25/22 0409  BP: 104/62 112/63 112/66 110/77  Pulse: 85 79 88 82  Resp: _0 Temp: 98.9 F (37.2 C) 98.7 F (37.1 C) 98.6 F (37 C) 98.9 F (37.2 C)  TempSrc: Oral Oral Oral Oral  SpO2: 97% 98% 96% 97%  Weight:      Height:        Intake/Output Summary (Last 24 hours) at 10/25/2022 1346 Last data filed at 10/25/2022 0905 Gross per 24 hour  Intake 162.32 ml  Output 400 ml  Net -237.68 ml   Filed Weights   10/24/22 0755  Weight: 38.6 kg    Examination:  General exam: alert, awake, communicative, weak and frail, posterior scalp laceration  Respiratory system: Clear to auscultation. Respiratory effort normal. Cardiovascular system:  RRR.  Gastrointestinal system: Abdomen is nondistended, soft and nontender.  Normal bowel sounds heard. Central nervous system: Alert and oriented. No focal neurological deficits. Extremities:  right lower extremity edema Skin: No rashes, lesions or ulcers Psychiatry: Judgement and insight appear normal. Mood & affect appropriate.     Data Reviewed: I have personally reviewed  labs and visualized  imaging studies since the last encounter and formulate the plan        Scheduled Meds:  brimonidine  1 drop Right Eye BID   citalopram  20 mg Oral Daily   dorzolamide-timolol  1 drop Both Eyes BID   feeding supplement  237 mL Oral TID BM   latanoprost  1 drop Right Eye QHS   megestrol  400 mg Oral Daily   multivitamin with minerals  1 tablet Oral Daily   pilocarpine  1 drop Right Eye BID   saccharomyces  boulardii  250 mg Oral BID   Continuous Infusions:  azithromycin 500 mg (10/25/22 1106)   cefTRIAXone (ROCEPHIN)  IV 1 g (10/25/22 1018)   heparin 700 Units/hr (10/24/22 1659)   lactated ringers  LOS: 0 days   Time spent: 68mns  FFlorencia Reasons MD PhD FACP [facility]  Available via Epic secure chat 7am-7pm for nonurgent issues Please page for urgent issues To page the attending provider between 7A-7P or the covering provider during after hours 7P-7A, please log into the web site [email or web address] and access using universal [place] password for that web site. If you do not have the password, please call the hospital operator.    10/25/2022, 1:46 PM

## 2022-10-25 NOTE — Evaluation (Signed)
Occupational Therapy Evaluation Patient Details Name: Julia Hampton MRN: [id number] DOB: Jan 31, 1930 Today's Date: 10/25/2022   History of Present Illness Patient is a 89 year old female who presented after a fall with one week history of RLE edema. Patient was admitted with PE and RLE DVT, and multifocal pneumonia. PMH: glaucoma, cholecystectomy,   Clinical Impression   Patient is a 89 year old female who was admitted for above. Patient was living at home independently with grandson. Patient currently is quick to fatigue with HR noted to reach 135 bpm with transfer to recliner in room. Patient plans to transition home with 24/7 caregiver support from family members and paid caregivers as per daughter report who was present in room. Patient would continue to benefit from skilled OT services at this time while admitted and after d/c to address noted deficits in order to improve overall safety and independence in ADLs.         Recommendations for follow up therapy are one component of a multi-disciplinary discharge planning process, led by the attending physician.  Recommendations may be updated based on patient status, additional functional criteria and insurance authorization.   Follow Up Recommendations  Home health OT     Assistance Recommended at Discharge Frequent or constant Supervision/Assistance  Patient can return home with the following A little help with walking and/or transfers;A little help with bathing/dressing/bathroom;Direct supervision/assist for financial management;Help with stairs or ramp for entrance;Assist for transportation;Direct supervision/assist for medications management;Assistance with cooking/housework    Functional Status Assessment  Patient has had a recent decline in their functional status and demonstrates the ability to make significant improvements in function in a reasonable and predictable amount of time.  Equipment Recommendations  None recommended by OT     Recommendations for Other Services       Precautions / Restrictions Precautions Precaution Comments: monitor HR Restrictions Weight Bearing Restrictions: No      Mobility Bed Mobility Overal bed mobility: Needs Assistance Bed Mobility: Supine to Sit     Supine to sit: Min assist     General bed mobility comments: with increased time and cues to scoot forwards          Balance Overall balance assessment: Mild deficits observed, not formally tested           ADL either performed or assessed with clinical judgement   ADL Overall ADL's : Needs assistance/impaired Eating/Feeding: Set up;Sitting Eating/Feeding Details (indicate cue type and reason): reported having a hard time seeing food but was able to scoop and spear potato with no spillage noted. noted to have a plate with low contast (black plate with dark meat on it) Grooming: Minimal assistance;Sitting Grooming Details (indicate cue type and reason): did not brush hair with staples in place with large ammount of dried blood around the occpital area. daughter asked if it was safe to wash hair. patient's daughter was encouraged to speak with MD about this as they are the ones who would be able to say if the staples can get wet. Upper Body Bathing: Minimal assistance;Sitting   Lower Body Bathing: Bed level;Total assistance   Upper Body Dressing : Minimal assistance;Sitting   Lower Body Dressing: Bed level;Total assistance Lower Body Dressing Details (indicate cue type and reason): unable to bring BLE to lap to complete LB dressing tasks seated EOB. patient endorsed being able to complete them prior level. Toilet Transfer: Minimal assistance;Ambulation;Rolling walker (2 wheels) Toilet Transfer Details (indicate cue type and reason): with increased time to  recliner in room with HR noted to reach 135 bpm with mobility. Toileting- Clothing Manipulation and Hygiene: Total assistance;Sit to/from stand                Vision Patient Visual Report: No change from baseline              Pertinent Vitals/Pain Pain Assessment Pain Assessment: No/denies pain     Hand Dominance Right   Extremity/Trunk Assessment Upper Extremity Assessment Upper Extremity Assessment: Overall WFL for tasks assessed (noted to have little muscle mass over all. strength WFL)   Lower Extremity Assessment Lower Extremity Assessment: Defer to PT evaluation   Cervical / Trunk Assessment Cervical / Trunk Assessment: Kyphotic   Communication Communication Communication: No difficulties   Cognition Arousal/Alertness: Awake/alert Behavior During Therapy: WFL for tasks assessed/performed Overall Cognitive Status: Within Functional Limits for tasks assessed             General Comments  noted to have staples on occpital area of scalp with dried blood from fall prior to arrival.            Home Living Family/patient expects to be discharged to:: Private residence Living Arrangements: Other (Comment) (grandson (who is moving out soon)) Available Help at Discharge: Family;Available PRN/intermittently (family reported having support during the day but working on PCA for evening hours.) Type of Home: House Home Access: Stairs to enter Secretary/administrator of Steps: 3   Home Layout: One level (1 step to porch area where the cats are)     Bathroom Shower/Tub: Walk-in shower         Home Equipment: Gilmer Mor - single point          Prior Functioning/Environment Prior Level of Function : Independent/Modified Independent               ADLs Comments: with increased time.        OT Problem List: Decreased activity tolerance;Impaired balance (sitting and/or standing);Decreased coordination;Cardiopulmonary status limiting activity;Decreased knowledge of use of DME or AE;Decreased knowledge of precautions;Decreased safety awareness      OT Treatment/Interventions: Self-care/ADL training;Energy  conservation;Therapeutic exercise;DME and/or AE instruction;Patient/family education;Therapeutic activities;Balance training    OT Goals(Current goals can be found in the care plan section) Acute Rehab OT Goals Patient Stated Goal: to get stronger OT Goal Formulation: With patient/family Time For Goal Achievement: 11/08/22 Potential to Achieve Goals: Fair  OT Frequency: Min 2X/week    Co-evaluation PT/OT/SLP Co-Evaluation/Treatment: Yes Reason for Co-Treatment: To address functional/ADL transfers PT goals addressed during session: Mobility/safety with mobility OT goals addressed during session: ADL's and self-care      AM-PAC OT "6 Clicks" Daily Activity     Outcome Measure Help from another person eating meals?: A Little Help from another person taking care of personal grooming?: A Little Help from another person toileting, which includes using toliet, bedpan, or urinal?: A Lot Help from another person bathing (including washing, rinsing, drying)?: A Lot Help from another person to put on and taking off regular upper body clothing?: A Little Help from another person to put on and taking off regular lower body clothing?: A Lot 6 Click Score: 15   End of Session Equipment Utilized During Treatment: Gait belt;Rolling walker (2 wheels) Nurse Communication: Mobility status  Activity Tolerance: Patient tolerated treatment well Patient left: in chair;with call bell/phone within reach;with chair alarm set;with family/visitor present  OT Visit Diagnosis: Unsteadiness on feet (R26.81);Other abnormalities of gait and mobility (R26.89);Muscle weakness (generalized) (M62.81)  Time: 9774-1423 OT Time Calculation (min): 23 min Charges:  OT General Charges $OT Visit: 1 Visit OT Evaluation $OT Eval Moderate Complexity: 1 Mod  Henrick Mcgue OTR/L, MS Acute Rehabilitation Department Office# [phone]   Selinda Flavin 10/25/2022, 1:22 PM

## 2022-10-25 NOTE — Evaluation (Signed)
Physical Therapy Evaluation Patient Details Name: Julia Hampton MRN: [id number] DOB: May 13, 1930 Today's Date: 10/25/2022  History of Present Illness  Patient is a 89 year old female who presented after a fall with one week history of RLE edema. Patient was admitted with PE and RLE DVT, and multifocal pneumonia. PMH: glaucoma, cholecystectomy,  Clinical Impression  Pt admitted with above diagnosis. Pt from home, living with grandson but he is moving out, using SPC in the home for limited ambulation. Pt's daughter at bedside reports family will be increasing time spent there, also looking into private care agency for additional assistance. Pt slightly shaky with transfers and ambulation, needing min A to steady. Pt with generalized BLE weakness, denies numbness/tingling, HR up to 138 but denies SOB, lightheadedness, pain or racing sensation. Recommending HHPT with family support at discharge. Pt currently with functional limitations due to the deficits listed below (see PT Problem List). Pt will benefit from skilled PT to increase their independence and safety with mobility to allow discharge to the venue listed below.          Recommendations for follow up therapy are one component of a multi-disciplinary discharge planning process, led by the attending physician.  Recommendations may be updated based on patient status, additional functional criteria and insurance authorization.  Follow Up Recommendations Home health PT      Assistance Recommended at Discharge Frequent or constant Supervision/Assistance  Patient can return home with the following  A little help with walking and/or transfers;A little help with bathing/dressing/bathroom;Assistance with cooking/housework;Assist for transportation;Help with stairs or ramp for entrance    Equipment Recommendations Rolling walker (2 wheels)  Recommendations for Other Services       Functional Status Assessment Patient has had a recent decline in  their functional status and demonstrates the ability to make significant improvements in function in a reasonable and predictable amount of time.     Precautions / Restrictions Precautions Precautions: Fall Precaution Comments: monitor HR Restrictions Weight Bearing Restrictions: No      Mobility  Bed Mobility Overal bed mobility: Needs Assistance Bed Mobility: Supine to Sit  Supine to sit: Min assist  General bed mobility comments: with increased time and cues to scoot forward    Transfers Overall transfer level: Needs assistance Equipment used: Rolling walker (2 wheels) Transfers: Sit to/from Stand Sit to Stand: Min assist  General transfer comment: min A to steady and encourage anterior weight shift, braces BLE against seated surface    Ambulation/Gait Ambulation/Gait assistance: Min assist Gait Distance (Feet): 20 Feet Assistive device: Rolling walker (2 wheels) Gait Pattern/deviations: Step-to pattern, Decreased stride length Gait velocity: decreased  General Gait Details: short step to steps with minimal bil foot clearance, slightly unsteady needing min A at times and assist to maneuver RW, no overt LOB  Stairs            Wheelchair Mobility    Modified Rankin (Stroke Patients Only)       Balance Overall balance assessment: Mild deficits observed, not formally tested        Pertinent Vitals/Pain Pain Assessment Pain Assessment: No/denies pain    Home Living Family/patient expects to be discharged to:: Private residence Living Arrangements: Other (Comment) (grandson (who is moving out soon)) Available Help at Discharge: Family;Available PRN/intermittently (family reported having support during the day but working on PCA for evening hours.) Type of Home: House Home Access: Stairs to enter   [employer] of Steps: 3   Home Layout: One level (1  step to porch area where the cats are) Home Equipment: Kasandra Knudsen - single point      Prior  Function Prior Level of Function : Independent/Modified Independent  Mobility Comments: daughter reports pt minimally ambulatory in the home with [facility] ADLs Comments: with increased time     Hand Dominance   Dominant Hand: Right    Extremity/Trunk Assessment   Upper Extremity Assessment Upper Extremity Assessment: Defer to OT evaluation    Lower Extremity Assessment Lower Extremity Assessment: Generalized weakness (AROM WFL, strength grossly 3+/5, denies numbness/tingling)    Cervical / Trunk Assessment Cervical / Trunk Assessment: Kyphotic  Communication   Communication: No difficulties  Cognition Arousal/Alertness: Awake/alert Behavior During Therapy: WFL for tasks assessed/performed Overall Cognitive Status: Within Functional Limits for tasks assessed       General Comments General comments (skin integrity, edema, etc.): HR up to 138 with amb, pt denies SOB, dizziness, racing sensation, pain    Exercises     Assessment/Plan    PT Assessment Patient needs continued PT services  PT Problem List Decreased strength;Decreased activity tolerance;Decreased balance;Decreased mobility;Decreased knowledge of use of DME;Decreased safety awareness       PT Treatment Interventions DME instruction;Gait training;Stair training;Functional mobility training;Therapeutic activities;Therapeutic exercise;Balance training;Patient/family education    PT Goals (Current goals can be found in the Care Plan section)  Acute Rehab PT Goals Patient Stated Goal: return home with assist PT Goal Formulation: With patient/family Time For Goal Achievement: 11/08/22 Potential to Achieve Goals: Good    Frequency Min 3X/week     Co-evaluation   Reason for Co-Treatment: To address functional/ADL transfers PT goals addressed during session: Mobility/safety with mobility;Balance;Proper use of DME OT goals addressed during session: ADL's and self-care       AM-PAC PT "6 Clicks" Mobility   Outcome Measure Help needed turning from your back to your side while in a flat bed without using bedrails?: A Little Help needed moving from lying on your back to sitting on the side of a flat bed without using bedrails?: A Little Help needed moving to and from a bed to a chair (including a wheelchair)?: A Little Help needed standing up from a chair using your arms (e.g., wheelchair or bedside chair)?: A Little Help needed to walk in hospital room?: A Little Help needed climbing 3-5 steps with a railing? : A Lot 6 Click Score: 17    End of Session Equipment Utilized During Treatment: Gait belt Activity Tolerance: Patient tolerated treatment well Patient left: in chair;with call bell/phone within reach;with chair alarm set;with family/visitor present Nurse Communication: Mobility status PT Visit Diagnosis: Unsteadiness on feet (R26.81);Other abnormalities of gait and mobility (R26.89);Muscle weakness (generalized) (M62.81)    Time: 1683-7290 PT Time Calculation (min) (ACUTE ONLY): 25 min   Charges:   PT Evaluation $PT Eval Moderate Complexity: 1 Mod           Tori Edith Groleau PT, DPT 10/25/22, 2:33 PM

## 2022-10-26 ENCOUNTER — Telehealth: Payer: Self-pay | Admitting: Student

## 2022-10-26 DIAGNOSIS — S0101XA Laceration without foreign body of scalp, initial encounter: Secondary | ICD-10-CM | POA: Diagnosis not present

## 2022-10-26 DIAGNOSIS — R918 Other nonspecific abnormal finding of lung field: Secondary | ICD-10-CM

## 2022-10-26 DIAGNOSIS — I82401 Acute embolism and thrombosis of unspecified deep veins of right lower extremity: Secondary | ICD-10-CM | POA: Diagnosis not present

## 2022-10-26 DIAGNOSIS — I2699 Other pulmonary embolism without acute cor pulmonale: Secondary | ICD-10-CM | POA: Diagnosis not present

## 2022-10-26 DIAGNOSIS — W19XXXA Unspecified fall, initial encounter: Secondary | ICD-10-CM | POA: Diagnosis not present

## 2022-10-26 LAB — CBC
HCT: 33.7 % — ABNORMAL LOW (ref 36.0–46.0)
Hemoglobin: 10.7 g/dL — ABNORMAL LOW (ref 12.0–15.0)
MCH: 31.3 pg (ref 26.0–34.0)
MCHC: 31.8 g/dL (ref 30.0–36.0)
MCV: 98.5 fL (ref 80.0–100.0)
Platelets: 202 10*3/uL (ref 150–400)
RBC: 3.42 MIL/uL — ABNORMAL LOW (ref 3.87–5.11)
RDW: 15.2 % (ref 11.5–15.5)
WBC: 7 10*3/uL (ref 4.0–10.5)
nRBC: 0 % (ref 0.0–0.2)

## 2022-10-26 LAB — BASIC METABOLIC PANEL
Anion gap: 7 (ref 5–15)
BUN: 26 mg/dL — ABNORMAL HIGH (ref 8–23)
CO2: 22 mmol/L (ref 22–32)
Calcium: 8.5 mg/dL — ABNORMAL LOW (ref 8.9–10.3)
Chloride: 110 mmol/L (ref 98–111)
Creatinine, Ser: 0.56 mg/dL (ref 0.44–1.00)
GFR, Estimated: >60 mL/min (ref >60)
Glucose, Bld: 92 mg/dL (ref 70–99)
Potassium: 4.2 mmol/L (ref 3.5–5.1)
Sodium: 139 mmol/L (ref 135–145)

## 2022-10-26 LAB — HEPARIN LEVEL (UNFRACTIONATED): Heparin Unfractionated: 0.53 IU/mL (ref 0.30–0.70)

## 2022-10-26 LAB — LEGIONELLA PNEUMOPHILA SEROGP 1 UR AG: L. pneumophila Serogp 1 Ur Ag: NEGATIVE

## 2022-10-26 LAB — PROCALCITONIN: Procalcitonin: 0.2 ng/mL

## 2022-10-26 MED ORDER — LACTATED RINGERS IV SOLN: INTRAVENOUS | Status: DC

## 2022-10-26 MED ORDER — CHOLECALCIFEROL 10 MCG (400 UNIT) PO TABS
400.0000 [IU] | ORAL_TABLET | Freq: Every day | ORAL | Status: DC
  Administered 2022-10-26 – 2022-10-30 (×4): 400 [IU] via ORAL
  Filled 2022-10-26 (×6): qty 1

## 2022-10-26 MED ORDER — APIXABAN 5 MG PO TABS
10.0000 mg | ORAL_TABLET | Freq: Two times a day (BID) | ORAL | Status: DC
  Administered 2022-10-26 – 2022-10-30 (×9): 10 mg via ORAL
  Filled 2022-10-26 (×9): qty 2

## 2022-10-26 MED ORDER — MIRTAZAPINE 15 MG PO TABS
7.5000 mg | ORAL_TABLET | Freq: Every day | ORAL | Status: DC
  Administered 2022-10-26 – 2022-10-29 (×4): 7.5 mg via ORAL
  Filled 2022-10-26 (×4): qty 1

## 2022-10-26 MED ORDER — APIXABAN 5 MG PO TABS: 5.0000 mg | ORAL_TABLET | Freq: Two times a day (BID) | ORAL | Status: DC

## 2022-10-26 MED ORDER — DORZOLAMIDE HCL-TIMOLOL MAL PF 2-0.5 % OP SOLN
1.0000 [drp] | Freq: Two times a day (BID) | OPHTHALMIC | Status: DC
  Administered 2022-10-26 – 2022-10-30 (×8): 1 [drp] via OPHTHALMIC

## 2022-10-26 MED ORDER — AZITHROMYCIN 250 MG PO TABS
500.0000 mg | ORAL_TABLET | Freq: Every day | ORAL | Status: AC
  Administered 2022-10-26 – 2022-10-28 (×3): 500 mg via ORAL
  Filled 2022-10-26 (×3): qty 2

## 2022-10-26 NOTE — Telephone Encounter (Signed)
Can we arrange clinic visit in 8 weeks me or any MD?

## 2022-10-26 NOTE — Progress Notes (Signed)
PT demonstrated hands on understanding of Flutter device. NPC at this time- dry cough.

## 2022-10-26 NOTE — TOC Progression Note (Addendum)
Transition of Care [facility]) - Progression Note    Patient Details  Name: Julia Hampton MRN: [id number] Date of Birth: 06-26-1930  [facility] CM/SW [name], RN Phone Number:[phone]  10/26/2022, 2:49 PM  Clinical Narrative:    [facility] acknowledges consult for Home health /DME needs and daughter request for SNF. CM will reach out to daughter. Currently the patient has a recommendation for Home health per PT documentation. This would mean that patient would not qualify for insurance coverage for SNF level of care.   Fritch spoke with daughter [name]. Per daughter patient is not strong enough to return home. Daughter states that her son was living with patient but is in the process of moving out, so the patient will be home alone and will need to be strong enough to ambulate to the restroom and the kitchen. Daughter states that patient does not have 24/7 caregivers as previously mentioned in therapy notes. Daughter also states that she did mention that she would try to get caregivers set up but has been unable to do so and that patient does not have much money. Daughter states that patient is not able to feed herself and that it took 2 people to help her to stand. Daughter does not think discharging patient home without some rehab is a safe plan. TOC will follow up with MD.         Expected Discharge Plan and Services                                               Social Determinants of Health (SDOH) Interventions SDOH Screenings   Food Insecurity: No Food Insecurity (10/24/2022)  Housing: Low Risk  (10/24/2022)  Transportation Needs: No Transportation Needs (10/24/2022)  Utilities: Not At Risk (10/24/2022)  Tobacco Use: Low Risk  (10/24/2022)    Readmission Risk Interventions     No data to display

## 2022-10-26 NOTE — Progress Notes (Addendum)
PROGRESS NOTE    Julia Hampton  OZH:[id number] DOB: 1930/07/15 DOA: 10/24/2022 PCP: Kristen Loader, FNP     Brief Narrative:   H/o  glaucoma. Presenting with a fall. Her daughter reports that she has had  right leg swelling x 1week FTT, progressive weakness, has been mostly sitting around all day for the last 52month due to weakness, she denies chest pain, no cough, no fever,  Subjective:  No acute event, there is no fever, no hypoxia, no cough She is very weak but no acute complaints She wants to go home  Family at bedside   Assessment & Plan:  Principal Problem:   Acute pulmonary embolism ([place] Active Problems:   Multifocal pneumonia   Protein-calorie malnutrition, severe   Glaucoma   Fall at home, initial encounter   DVT (deep venous thrombosis) ([place]   Pulmonary emboli ([place]    Assessment and Plan:   Acute PE (lobar and segmental pulmonary embolism in the left lower lobe. Smaller embolism at right upper lobe segmental level.)  RLE DVT -not sure if provoked but does has risk factors including  inactivity and use megace at home, D/c megace      -  bp low normal  with sinus tachycardia initially, no hypoxia at rest, troponin negative  -echo unremarkable -  was treated with heparin  drip, has improved, transition to eliquis, daughter is in agreement         Multifocal PNA? Though she denies respiratory symptom -CTA chest showed "Patchy airspace disease with nodules and mild bronchiectasis, suspect a combination of chronic lung disease (such as MAC) and acute pneumonia. If appropriate for comorbidities a chest CT in 2-3 months may be useful."      - continue CAP coverage     - urine  strep negative, urine legionella in process     -procal 0.34- 0.20     - case  discuss with pulmonology  Dr MVerlee Montewho agree with abx for CAP, then outpatient follow up in 8wks   Fall/FTT/progressive weakness/Posterior scalp laceration without calvarial fracture on CT head -CTH/CT  c-spine ok     - PT/OT eval     - laceration repaired with staplesx3 in the ED, to be removed in 7-14 days   Glaucoma     - continue home regimen   Impaired memory Not remember the month   Underweight      - dietitian consult   Nutritional Assessment: The patient's BMI is: Body mass index is 17.17 kg/m..Marland KitchenSeen by dietician.  I agree with the assessment and plan as outlined below: Nutrition Status: Nutrition Problem: Underweight Etiology: chronic illness Signs/Symptoms: other (comment) (BMI 17.17) Interventions: Ensure Enlive (each supplement provides 350kcal and 20 grams of protein), MVI, Refer to RD note for recommendations  .poor oral intake, nutrition supplement, continue ivf for another 24hrs      I have Reviewed nursing notes, Vitals, pain scores, I/o's, Lab results and  imaging results since pt's last encounter, details please see discussion above  I ordered the following labs:  Unresulted Labs (From admission, onward)     Start     Ordered   10/27/22 0500  CBC with Differential/Platelet  Tomorrow morning,   R        10/26/22 1702   10/27/22 06295 Basic metabolic panel  Tomorrow morning,   R        10/26/22 1702   10/27/22 0500  Magnesium  Tomorrow morning,   R  10/26/22 1702   10/27/22 0500  Phosphorus  Tomorrow morning,   R        10/26/22 1702   10/26/22 0500  Procalcitonin  Daily,   R      10/25/22 0915             DVT prophylaxis: on heparin drip, then eliquis apixaban (ELIQUIS) tablet 10 mg  apixaban (ELIQUIS) tablet 5 mg   Code Status:   Code Status: DNR  Family Communication: daughter at bedside daily Disposition:   Dispo: The patient is from: home              Anticipated d/c is to: SNF              Anticipated d/c date is: medically ready to discharge, awaiting for snf  Antimicrobials:    Anti-infectives (From admission, onward)    Start     Dose/Rate Route Frequency Ordered Stop   10/26/22 1000  azithromycin (ZITHROMAX) tablet  500 mg        500 mg Oral Daily 10/26/22 0735 10/29/22 0959   10/25/22 1000  cefTRIAXone (ROCEPHIN) 1 g in sodium chloride 0.9 % 100 mL IVPB        1 g 200 mL/hr over 30 Minutes Intravenous Every 24 hours 10/24/22 1056 10/30/22 0959   10/25/22 1000  azithromycin (ZITHROMAX) 500 mg in sodium chloride 0.9 % 250 mL IVPB  Status:  Discontinued        500 mg 250 mL/hr over 60 Minutes Intravenous Every 24 hours 10/24/22 1056 10/26/22 0735   10/24/22 0700  cefTRIAXone (ROCEPHIN) 1 g in sodium chloride 0.9 % 100 mL IVPB        1 g 200 mL/hr over 30 Minutes Intravenous  Once 10/24/22 0659 10/24/22 0919   10/24/22 0700  azithromycin (ZITHROMAX) 500 mg in sodium chloride 0.9 % 250 mL IVPB        500 mg 250 mL/hr over 60 Minutes Intravenous  Once 10/24/22 0659 10/24/22 0919          Objective: Vitals:   10/25/22 0409 10/25/22 1951 10/26/22 0351 10/26/22 1356  BP: 110/77 127/67 131/68 116/67  Pulse: 82 85 60 77  Resp: _0 Temp: 98.9 F (37.2 C) 98.6 F (37 C) (!) 97.5 F (36.4 C) 97.9 F (36.6 C)  TempSrc: Oral Oral Oral Oral  SpO2: 97% 98% 97% 97%  Weight:      Height:        Intake/Output Summary (Last 24 hours) at 10/26/2022 1703 Last data filed at 10/26/2022 1401 Gross per 24 hour  Intake 2139.69 ml  Output 200 ml  Net 1939.69 ml   Filed Weights   10/24/22 0755  Weight: 38.6 kg    Examination:  General exam: alert, awake, communicative, weak and frail, posterior scalp laceration with staples Respiratory system: Clear to auscultation. Respiratory effort normal. Cardiovascular system:  RRR.  Gastrointestinal system: Abdomen is nondistended, soft and nontender.  Normal bowel sounds heard. Central nervous system: Alert and oriented. No focal neurological deficits. With slightly impaired memory  Extremities:  right lower extremity edema Skin: No rashes, lesions or ulcers Psychiatry: Judgement and insight appear normal. Mood & affect appropriate.     Data  Reviewed: I have personally reviewed  labs and visualized  imaging studies since the last encounter and formulate the plan        Scheduled Meds:  apixaban  10 mg Oral BID   Followed by   [  START ON 11/02/2022] apixaban  5 mg Oral BID   azithromycin  500 mg Oral Daily   brimonidine  1 drop Right Eye BID   cholecalciferol  400 Units Oral Daily   citalopram  20 mg Oral Daily   dorzolamide-timolol  1 drop Both Eyes BID   feeding supplement  237 mL Oral TID BM   latanoprost  1 drop Right Eye QHS   mirtazapine  7.5 mg Oral QHS   multivitamin with minerals  1 tablet Oral Daily   pilocarpine  1 drop Right Eye BID   saccharomyces boulardii  250 mg Oral BID   Continuous Infusions:  cefTRIAXone (ROCEPHIN)  IV 1 g (10/26/22 0946)   lactated ringers 50 mL/hr at 10/26/22 1359     LOS: 1 day   Time spent: 42mns  FFlorencia Reasons MD PhD FACP [facility]  Available via Epic secure chat 7am-7pm for nonurgent issues Please page for urgent issues To page the attending provider between 7A-7P or the covering provider during after hours 7P-7A, please log into the web site [email or web address] and access using universal [place] password for that web site. If you do not have the password, please call the hospital operator.    10/26/2022, 5:03 PM

## 2022-10-26 NOTE — Discharge Instructions (Signed)
Information on my medicine - ELIQUIS (apixaban)  This medication education was reviewed with me or my healthcare representative as part of my discharge preparation.  T Why was Eliquis prescribed for you? Eliquis was prescribed to treat blood clots that may have been found in the veins of your legs (deep vein thrombosis) or in your lungs (pulmonary embolism) and to reduce the risk of them occurring again.  What do You need to know about Eliquis ? The starting dose is 10 mg (two 5 mg tablets) taken TWICE daily for the FIRST SEVEN (7) DAYS, then on Monday 11/02/2022  the dose is reduced to ONE 5 mg tablet taken TWICE daily.  Eliquis may be taken with or without food.   Try to take the dose about the same time in the morning and in the evening. If you have difficulty swallowing the tablet whole please discuss with your pharmacist how to take the medication safely.  Take Eliquis exactly as prescribed and DO NOT stop taking Eliquis without talking to the doctor who prescribed the medication.  Stopping may increase your risk of developing a new blood clot.  Refill your prescription before you run out.  After discharge, you should have regular check-up appointments with your healthcare provider that is prescribing your Eliquis.    What do you do if you miss a dose? If a dose of ELIQUIS is not taken at the scheduled time, take it as soon as possible on the same day and twice-daily administration should be resumed. The dose should not be doubled to make up for a missed dose.  Important Safety Information A possible side effect of Eliquis is bleeding. You should call your healthcare provider right away if you experience any of the following: Bleeding from an injury or your nose that does not stop. Unusual colored urine (red or dark brown) or unusual colored stools (red or black). Unusual bruising for unknown reasons. A serious fall or if you hit your head (even if there is no bleeding).  Some  medicines may interact with Eliquis and might increase your risk of bleeding or clotting while on Eliquis. To help avoid this, consult your healthcare provider or pharmacist prior to using any new prescription or non-prescription medications, including herbals, vitamins, non-steroidal anti-inflammatory drugs (NSAIDs) and supplements.  This website has more information on Eliquis (apixaban): http://www.eliquis.com/eliquis/home

## 2022-10-26 NOTE — Plan of Care (Signed)
  Problem: Clinical Measurements: Goal: Respiratory complications will improve Outcome: Not Progressing   Problem: Clinical Measurements: Goal: Diagnostic test results will improve Outcome: Not Progressing   Problem: Activity: Goal: Risk for activity intolerance will decrease Outcome: Not Progressing   Problem: Pain Managment: Goal: General experience of comfort will improve Outcome: Not Progressing

## 2022-10-26 NOTE — Progress Notes (Signed)
Physical Therapy Treatment Patient Details Name: Julia Hampton MRN: [id number] DOB: 30-Mar-1930 Today's Date: 10/26/2022   History of Present Illness Patient is a 89 year old female who presented after a fall with one week history of RLE edema. Patient was admitted with PE and RLE DVT, and multifocal pneumonia. PMH: glaucoma, cholecystectomy,    PT Comments    Pt pleasant and willing to work with PT however mobility limited by bowel and bladder incontinence episodes.  Spoke with pt and dtr, they do not feel they will be able to provide needed assist at home. Pt will likely need SNF to incr independence prior to return home. Will follow in acute setting.  Recommendations for follow up therapy are one component of a multi-disciplinary discharge planning process, led by the attending physician.  Recommendations may be updated based on patient status, additional functional criteria and insurance authorization.  Follow Up Recommendations  Skilled nursing-short term rehab (<3 hours/day) Can patient physically be transported by private vehicle: Yes   Assistance Recommended at Discharge Frequent or constant Supervision/Assistance  Patient can return home with the following A little help with walking and/or transfers;A little help with bathing/dressing/bathroom;Assistance with cooking/housework;Assist for transportation;Help with stairs or ramp for entrance   Equipment Recommendations  Rolling walker (2 wheels)    Recommendations for Other Services       Precautions / Restrictions Precautions Precautions: Fall Precaution Comments: incontinent B&B Restrictions Weight Bearing Restrictions: No     Mobility  Bed Mobility Overal bed mobility: Needs Assistance Bed Mobility: Supine to Sit, Sit to Supine     Supine to sit: Min assist Sit to supine: Min assist   General bed mobility comments: assist to elevate trunk and to bring LEs on to bed    Transfers Overall transfer level: Needs  assistance Equipment used: Rolling walker (2 wheels) Transfers: Sit to/from Stand, Bed to chair/wheelchair/BSC Sit to Stand: Min assist   Step pivot transfers: Min assist       General transfer comment: min A to steady and encourage anterior weight shift, braces BLE against seated surface; repeated STS for pericare. cues for RW direction, to balance while wt shifting bed<>BSC. incontinent bowel and bladder.    Ambulation/Gait               General Gait Details: deferred d/t bowel incontinence   Stairs             Wheelchair Mobility    Modified Rankin (Stroke Patients Only)       Balance                                            Cognition Arousal/Alertness: Awake/alert Behavior During Therapy: WFL for tasks assessed/performed Overall Cognitive Status: Within Functional Limits for tasks assessed                                          Exercises      General Comments        Pertinent Vitals/Pain Pain Assessment Pain Assessment: No/denies pain    Home Living                          Prior Function  PT Goals (current goals can now be found in the care plan section) Acute Rehab PT Goals Patient Stated Goal: return home with assist PT Goal Formulation: With patient/family Time For Goal Achievement: 11/08/22 Potential to Achieve Goals: Good Progress towards PT goals: Progressing toward goals    Frequency    Min 3X/week      PT Plan Current plan remains appropriate;Discharge plan needs to be updated    Co-evaluation              AM-PAC PT "6 Clicks" Mobility   Outcome Measure  Help needed turning from your back to your side while in a flat bed without using bedrails?: A Little Help needed moving from lying on your back to sitting on the side of a flat bed without using bedrails?: A Little Help needed moving to and from a bed to a chair (including a wheelchair)?: A  Little Help needed standing up from a chair using your arms (e.g., wheelchair or bedside chair)?: A Little Help needed to walk in hospital room?: A Little Help needed climbing 3-5 steps with a railing? : A Lot 6 Click Score: 17    End of Session Equipment Utilized During Treatment: Gait belt Activity Tolerance: Other (comment) (ltd 2* incont) Patient left: in bed;with call bell/phone within reach;with bed alarm set;with family/visitor present Nurse Communication: Mobility status PT Visit Diagnosis: Unsteadiness on feet (R26.81);Other abnormalities of gait and mobility (R26.89);Muscle weakness (generalized) (M62.81)     Time: 0569-7948 PT Time Calculation (min) (ACUTE ONLY): 44 min  Charges:  $Therapeutic Activity: 38-52 mins                     Chimere Klingensmith, PT  Acute Rehab Dept [facility]) [phone]  [facility] Weekend Pager [facility] only)  [phone]  10/26/2022    [facility] 10/26/2022, 3:35 PM

## 2022-10-26 NOTE — Progress Notes (Addendum)
ANTICOAGULATION CONSULT NOTE - Follow Up Consult  Pharmacy Consult for Heparin>> apixaban Indication:  DVT/ PE  Allergies  Allergen Reactions   Other Other (See Comments)    Glaucoma drop with a dark green top - not sure of name -    Patient Measurements: Height: 4\' 11"  (149.9 cm) Weight: 38.6 kg (85 lb) IBW/kg (Calculated) : 43.2 Heparin Dosing Weight:38.6 kg  Vital Signs: Temp: 97.5 F (36.4 C) (01/08 0351) Temp Source: Oral (01/08 0351) BP: 131/68 (01/08 0351) Pulse Rate: 60 (01/08 0351)  Labs: Recent Labs    10/24/22 0434 10/24/22 0750 10/24/22 1558 10/25/22 0052 10/25/22 0901 10/26/22 0640  HGB 14.2  --   --   --  12.2 10.7*  HCT 42.9  --   --   --  38.2 33.7*  PLT 188  --   --   --  222 202  LABPROT  --  15.3*  --   --   --   --   INR  --  1.2  --   --   --   --   HEPARINUNFRC  --   --  0.23* 0.36 0.59  --   CREATININE 0.71  --   --   --  0.79  --   TROPONINIHS  --  12  --   --   --   --      Estimated Creatinine Clearance: 28.5 mL/min (by C-G formula based on SCr of 0.79 mg/dL).  Assessment: 89 yo with new PE & DVT. Not on any anticoagulation prior to admission. Pharmacy consulted to dose IV heparin.  Baseline CBC WNL.   10/26/2022: Heparin level = 0.53 - remains therapeutic on IV heparin 700 units/hr CBC: Hg down to 10.7, PLT WNL No bleeding or infusion related interruptions reported by RN To transition to Eliquis today  Goal of Therapy:  Heparin level 0.3-0.7 units/ml Monitor platelets by anticoagulation protocol: Yes   Plan:  DC IV heparin  Eliquis 10 mg po bid x 7 days followed by Eliquis 5 mg po bid DC heparin levels/CBC DC Megace>> change to Remeron for appetite stimulation IV azithromycin> PO Will educate pt & family prior to discharge  Eudelia Bunch, Pharm.D Use secure chat for questions 10/26/2022 11:16 AM

## 2022-10-27 DIAGNOSIS — R4189 Other symptoms and signs involving cognitive functions and awareness: Secondary | ICD-10-CM | POA: Insufficient documentation

## 2022-10-27 DIAGNOSIS — I2699 Other pulmonary embolism without acute cor pulmonale: Secondary | ICD-10-CM | POA: Diagnosis not present

## 2022-10-27 DIAGNOSIS — F0390 Unspecified dementia without behavioral disturbance: Secondary | ICD-10-CM | POA: Insufficient documentation

## 2022-10-27 LAB — CBC WITH DIFFERENTIAL/PLATELET
Abs Immature Granulocytes: 0.27 10*3/uL — ABNORMAL HIGH (ref 0.00–0.07)
Basophils Absolute: 0.1 10*3/uL (ref 0.0–0.1)
Basophils Relative: 1 %
Eosinophils Absolute: 0.1 10*3/uL (ref 0.0–0.5)
Eosinophils Relative: 2 %
HCT: 34.3 % — ABNORMAL LOW (ref 36.0–46.0)
Hemoglobin: 10.9 g/dL — ABNORMAL LOW (ref 12.0–15.0)
Immature Granulocytes: 5 %
Lymphocytes Relative: 15 %
Lymphs Abs: 0.9 10*3/uL (ref 0.7–4.0)
MCH: 31.7 pg (ref 26.0–34.0)
MCHC: 31.8 g/dL (ref 30.0–36.0)
MCV: 99.7 fL (ref 80.0–100.0)
Monocytes Absolute: 0.7 10*3/uL (ref 0.1–1.0)
Monocytes Relative: 11 %
Neutro Abs: 4 10*3/uL (ref 1.7–7.7)
Neutrophils Relative %: 66 %
Platelets: 228 10*3/uL (ref 150–400)
RBC: 3.44 MIL/uL — ABNORMAL LOW (ref 3.87–5.11)
RDW: 14.8 % (ref 11.5–15.5)
WBC: 5.9 10*3/uL (ref 4.0–10.5)
nRBC: 0 % (ref 0.0–0.2)

## 2022-10-27 LAB — BASIC METABOLIC PANEL
Anion gap: 6 (ref 5–15)
BUN: 23 mg/dL (ref 8–23)
CO2: 23 mmol/L (ref 22–32)
Calcium: 8.4 mg/dL — ABNORMAL LOW (ref 8.9–10.3)
Chloride: 109 mmol/L (ref 98–111)
Creatinine, Ser: 0.63 mg/dL (ref 0.44–1.00)
GFR, Estimated: >60 mL/min (ref >60)
Glucose, Bld: 93 mg/dL (ref 70–99)
Potassium: 4.6 mmol/L (ref 3.5–5.1)
Sodium: 138 mmol/L (ref 135–145)

## 2022-10-27 LAB — PHOSPHORUS: Phosphorus: 3 mg/dL (ref 2.5–4.6)

## 2022-10-27 LAB — PROCALCITONIN: Procalcitonin: 0.11 ng/mL

## 2022-10-27 LAB — MAGNESIUM: Magnesium: 2 mg/dL (ref 1.7–2.4)

## 2022-10-27 MED ORDER — AMLODIPINE BESYLATE 5 MG PO TABS
5.0000 mg | ORAL_TABLET | Freq: Every day | ORAL | Status: DC
  Administered 2022-10-27 – 2022-10-30 (×4): 5 mg via ORAL
  Filled 2022-10-27 (×4): qty 1

## 2022-10-27 MED ORDER — LOPERAMIDE HCL 2 MG PO CAPS
2.0000 mg | ORAL_CAPSULE | ORAL | Status: DC | PRN
  Administered 2022-10-28: 2 mg via ORAL
  Filled 2022-10-27: qty 1

## 2022-10-27 NOTE — Assessment & Plan Note (Signed)
BP elevated - Resume amlodipine

## 2022-10-27 NOTE — Telephone Encounter (Signed)
Schedules are only out 5 weeks- okay to schedule patient in 5 weeks or wait?

## 2022-10-27 NOTE — Hospital Course (Signed)
Mrs. Ruehl is a 89 y.o. F with HTN, malnutrition and pulmonary nodules who presented with 1 week right leg swelling then fall.   In the ER, US showed RLE DVT and CTA chest showed bilateral PE.

## 2022-10-27 NOTE — Plan of Care (Signed)
Patient AOX3, disoriented to date, VSS throughout shift.  Denied pain and SOB.  Diminished lungs, IS encouraged.  All meds given on time as ordered.  Purewick remains in place with large output.  Fluids on-going.  Full linen changed.  POC maintained, will continue to monitor.  Problem: Activity: Goal: Ability to tolerate increased activity will improve Outcome: Progressing   Problem: Clinical Measurements: Goal: Ability to maintain a body temperature in the normal range will improve Outcome: Progressing   Problem: Respiratory: Goal: Ability to maintain adequate ventilation will improve Outcome: Progressing Goal: Ability to maintain a clear airway will improve Outcome: Progressing   Problem: Education: Goal: Knowledge of General Education information will improve Description: Including pain rating scale, medication(s)/side effects and non-pharmacologic comfort measures Outcome: Progressing   Problem: Health Behavior/Discharge Planning: Goal: Ability to manage health-related needs will improve Outcome: Progressing   Problem: Clinical Measurements: Goal: Ability to maintain clinical measurements within normal limits will improve Outcome: Progressing Goal: Will remain free from infection Outcome: Progressing Goal: Diagnostic test results will improve Outcome: Progressing Goal: Respiratory complications will improve Outcome: Progressing Goal: Cardiovascular complication will be avoided Outcome: Progressing   Problem: Activity: Goal: Risk for activity intolerance will decrease Outcome: Progressing   Problem: Nutrition: Goal: Adequate nutrition will be maintained Outcome: Progressing   Problem: Coping: Goal: Level of anxiety will decrease Outcome: Progressing   Problem: Elimination: Goal: Will not experience complications related to bowel motility Outcome: Progressing Goal: Will not experience complications related to urinary retention Outcome: Progressing   Problem:  Pain Managment: Goal: General experience of comfort will improve Outcome: Progressing   Problem: Safety: Goal: Ability to remain free from injury will improve Outcome: Progressing   Problem: Skin Integrity: Goal: Risk for impaired skin integrity will decrease Outcome: Progressing

## 2022-10-27 NOTE — Assessment & Plan Note (Signed)
See above

## 2022-10-27 NOTE — [place] FL2 (Signed)
[place] MEDICAID FL2 LEVEL OF CARE FORM     IDENTIFICATION  Patient Name: Julia Hampton Birthdate: [birth year] Sex: female Admission Date (Current Location): 10/24/2022  [facility] and [place] Number:  Producer, television/film/video and Address:  [facility],  [address] [place], [place] [address]      Provider Number: [id number]  Attending Physician Name and Address:  Alberteen Sam, *  Relative Name and Phone Number:  Tawanna Sat  daughter [phone]    Current Level of Care: Hospital Recommended Level of Care: Skilled Nursing Facility Prior Approval Number:    Date Approved/Denied:   PASRR Number: [id number]  Discharge Plan: SNF    Current Diagnoses: Patient Active Problem List   Diagnosis Date Noted   Cognitive impairment 10/27/2022   Acute pulmonary embolism (HCC) 10/24/2022   Glaucoma 10/24/2022   Fall at home, initial encounter 10/24/2022   DVT (deep venous thrombosis) (HCC) 10/24/2022   Underweight 01/16/2017   Multifocal pneumonia 01/14/2017   Unspecified constipation 09/30/2013   Hip fracture (HCC) 09/28/2013   HTN (hypertension) 09/28/2013   UTI (urinary tract infection) 09/28/2013    Orientation RESPIRATION BLADDER Height & Weight     Self, Place  Normal Incontinent Weight: 38.6 kg Height:  4\' 11"  (149.9 cm)  BEHAVIORAL SYMPTOMS/MOOD NEUROLOGICAL BOWEL NUTRITION STATUS  Other (Comment) (poor judgement , memory inpairment)  (n/a) Incontinent Diet  AMBULATORY STATUS COMMUNICATION OF NEEDS Skin   Limited Assist Verbally Other (Comment) (laceration noted to head)                       Personal Care Assistance Level of Assistance  Bathing, Feeding, Dressing Bathing Assistance: Limited assistance Feeding assistance: Limited assistance Dressing Assistance: Limited assistance     Functional Limitations Info  Sight, Hearing, Speech Sight Info: Adequate Hearing Info: Adequate Speech Info: Adequate    SPECIAL CARE FACTORS FREQUENCY   PT (By licensed PT), OT (By licensed OT)     PT Frequency: 5x/wk OT Frequency: 5x/wk            Contractures Contractures Info: Not present    Additional Factors Info  Code Status, Allergies, Psychotropic, Insulin Sliding Scale, Isolation Precautions, Suctioning Needs Code Status Info: DNR Allergies Info: other- Glaucoma eye drops with green top- not sure of the name Psychotropic Info: see discharge summary Insulin Sliding Scale Info: see discharge summary Isolation Precautions Info: n/a Suctioning Needs: n/a   Current Medications (10/27/2022):  This is the current hospital active medication list Current Facility-Administered Medications  Medication Dose Route Frequency Provider Last Rate Last Admin   acetaminophen (TYLENOL) tablet 650 mg  650 mg Oral Q6H PRN 12/26/2022, Tyrone A, DO   650 mg at 10/24/22 1130   Or   acetaminophen (TYLENOL) suppository 650 mg  650 mg Rectal Q6H PRN 12/23/22, Tyrone A, DO       amLODipine (NORVASC) tablet 5 mg  5 mg Oral Daily Danford, Ronaldo Miyamoto, MD   5 mg at 10/27/22 1022   apixaban (ELIQUIS) tablet 10 mg  10 mg Oral BID 12/26/22, RPH   10 mg at 10/27/22 1021   Followed by   12/26/22 ON 11/02/2022] apixaban (ELIQUIS) tablet 5 mg  5 mg Oral BID 11/04/2022 T, RPH       azithromycin (ZITHROMAX) tablet 500 mg  500 mg Oral Daily Len Childs T, RPH   500 mg at 10/27/22 1021   brimonidine (ALPHAGAN) 0.2 % ophthalmic solution 1 drop  1 drop Right Eye BID Florencia Reasons, MD   1 drop at 10/27/22 1023   cholecalciferol (VITAMIN D3) 10 MCG (400 UNIT) tablet 400 Units  400 Units Oral Daily Florencia Reasons, MD   400 Units at 10/27/22 1116   citalopram (CELEXA) tablet 20 mg  20 mg Oral Daily Florencia Reasons, MD   20 mg at 10/27/22 1024   Dorzolamide HCl-Timolol Mal PF 2-0.5 % SOLN 1 drop  1 drop Both Eyes BID Florencia Reasons, MD   1 drop at 10/27/22 1024   feeding supplement (ENSURE ENLIVE / ENSURE PLUS) liquid 237 mL  237 mL Oral TID BM Kyle, Tyrone A, DO   237 mL at 10/27/22 1023    guaiFENesin (ROBITUSSIN) 100 MG/5ML liquid 5 mL  5 mL Oral Q4H PRN Marylyn Ishihara, Tyrone A, DO       HYDROcodone-acetaminophen (NORCO/VICODIN) 5-325 MG per tablet 1-2 tablet  1-2 tablet Oral Q4H PRN Marylyn Ishihara, Tyrone A, DO   2 tablet at 10/25/22 2141   latanoprost (XALATAN) 0.005 % ophthalmic solution 1 drop  1 drop Right Eye Vonzell Schlatter, MD   1 drop at 10/26/22 2209   loperamide (IMODIUM) capsule 2 mg  2 mg Oral PRN Edwin Dada, MD       mirtazapine (REMERON) tablet 7.5 mg  7.5 mg Oral QHS Florencia Reasons, MD   7.5 mg at 10/26/22 2201   multivitamin with minerals tablet 1 tablet  1 tablet Oral Daily Kyle, Tyrone A, DO   1 tablet at 10/27/22 1022   pilocarpine (PILOCAR) 4 % ophthalmic solution 1 drop  1 drop Right Eye BID Florencia Reasons, MD   1 drop at 10/27/22 1024     Discharge Medications: Please see discharge summary for a list of discharge medications.  Relevant Imaging Results:  Relevant Lab Results:   Additional Information SS# [id number]  Angelita Ingles, RN

## 2022-10-27 NOTE — Assessment & Plan Note (Signed)
Malnutrition ruled out.    

## 2022-10-27 NOTE — Assessment & Plan Note (Signed)
Continue eyedrops 

## 2022-10-27 NOTE — Assessment & Plan Note (Addendum)
Echo normal, not requiring O2.  Admitted on heparin, transitioned to Eliquis. - Continue Eliquis

## 2022-10-27 NOTE — TOC Initial Note (Signed)
Transition of Care [facility]) - Initial/Assessment Note    Patient Details  Name: Julia Hampton MRN: [id number] Date of Birth: 18-Apr-1930  [facility]) CM/SW Contact:    Beckie Busing, RN Phone Number:[phone]  10/27/2022, 3:19 PM  Clinical Narrative:                 TOC has been consulted for SNF. Patient is from home where her daughter reports that she lives independently. Per daughter the grandson has been living with the patient but is currently in the process of moving out. Daughter repots that patient has no DME or Home health services. Patient is normally very independent and able to manage. Patient [facility]. Per daughter patient has transportation to appointment and follows up on a regular basis. Patient does have access to medications and no issues obtaining needed medications. Daughter gives consent to have patients information faxed out to facilities for bed offers. FL2 completed PASRR # [id number]. Patient info has been faxed out for bed offers. CM will follow up with bed offers.   Expected Discharge Plan: Skilled Nursing Facility Barriers to Discharge: SNF Pending bed offer   Patient Goals and CMS Choice Patient states their goals for this hospitalization and ongoing recovery are:: Patient unable to give a goal CMS Medicare.gov Compare Post Acute Care list provided to:: Other (Comment Required) (daughter Reader [employer]) Choice offered to / list presented to : Adult Children      Expected Discharge Plan and Services In-house Referral: NA Discharge Planning Services: CM Consult Post Acute Care Choice: Skilled Nursing Facility Living arrangements for the past 2 months: Single Family Home                 DME Arranged: N/A DME Agency: NA       HH Arranged: NA HH Agency: NA        Prior Living Arrangements/Services Living arrangements for the past 2 months: Single Family Home Lives with:: Self, Other (Comment)  (grandson) Patient language and need for interpreter reviewed:: Yes Do you feel safe going back to the place where you live?: Yes      Need for Family Participation in Patient Care: No (Comment) Care giver support system in place?: Yes (comment) Current home services:  (n/a) Criminal Activity/Legal Involvement Pertinent to Current Situation/Hospitalization: No - Comment as needed  Activities of Daily Living   ADL Screening (condition at time of admission) Is the patient deaf or have difficulty hearing?: No Does the patient have difficulty seeing, even when wearing glasses/contacts?: Yes Does the patient have difficulty concentrating, remembering, or making decisions?: No Does the patient have difficulty dressing or bathing?: Yes Does the patient have difficulty walking or climbing stairs?: Yes  Permission Sought/Granted Permission sought to share information with : Family Supports Permission granted to share information with : Yes, Verbal Permission Granted  Share Information with NAME: Silverio Decamp [phone]     Permission granted to share info w Relationship: daughter  Permission granted to share info w Contact Information: [phone]  Emotional Assessment Appearance:: Appears stated age Attitude/Demeanor/Rapport: Gracious Affect (typically observed): Quiet Orientation: : Oriented to Self Alcohol / Substance Use: Not Applicable Psych Involvement: No (comment)  Admission diagnosis:  Acute pulmonary embolism (HCC) [I26.99] Fall, initial encounter [W19.XXXA] Laceration of scalp, initial encounter [S01.01XA] Other acute pulmonary embolism without acute cor pulmonale (HCC) [I26.99] Pulmonary emboli (HCC) [I26.99] Patient Active Problem List   Diagnosis Date Noted   Cognitive  impairment 10/27/2022   Acute pulmonary embolism ([place]) 10/24/2022   Glaucoma 10/24/2022   Fall at home, initial encounter 10/24/2022   DVT (deep venous thrombosis) ([place]) 10/24/2022   Underweight  01/16/2017   Multifocal pneumonia 01/14/2017   Unspecified constipation 09/30/2013   Hip fracture ([place]) 09/28/2013   HTN (hypertension) 09/28/2013   UTI (urinary tract infection) 09/28/2013   PCP:  Kristen Loader, FNP Pharmacy:   [facility] [id number] - [place], [address] [place] [address] Phone: [phone] Fax: [phone]  [facility] [id number] - [place], [place] [place] [address] Phone: [phone] Fax: [phone]     Social Determinants of Health (SDOH) Social History: SDOH Screenings   Food Insecurity: No Food Insecurity (10/24/2022)  Housing: Low Risk  (10/24/2022)  Transportation Needs: No Transportation Needs (10/24/2022)  Utilities: Not At Risk (10/24/2022)  Tobacco Use: Low Risk  (10/24/2022)   SDOH Interventions:     Readmission Risk Interventions    10/27/2022    3:07 PM  Readmission Risk Prevention Plan  Post Dischage Appt Complete  Medication Screening Complete  Transportation Screening Complete

## 2022-10-27 NOTE — Assessment & Plan Note (Signed)
Likely dementia.

## 2022-10-27 NOTE — Progress Notes (Signed)
Occupational Therapy Treatment Patient Details Name: Julia Hampton MRN: [id number] DOB: 03-15-1930 Today's Date: 10/27/2022   History of present illness Patient is a 89 year old female who presented after a fall with one week history of RLE edema. Patient was admitted with PE and RLE DVT, and multifocal pneumonia. PMH: glaucoma, cholecystectomy,   OT comments  Patient's daughter in room reporting that caregivers were not available to patient at time of d/c and family was unable to support patient at current level. Patient was noted to have had large incontinence episode prior to start of session with max A for hygiene tasks. Patient was able to engage in transfers, standing tolerance, and bathing tasks with improvement from first OT session. OT recommendations changed to SNF for short term rehab at this time. OT to continue to follow.    Recommendations for follow up therapy are one component of a multi-disciplinary discharge planning process, led by the attending physician.  Recommendations may be updated based on patient status, additional functional criteria and insurance authorization.    Follow Up Recommendations  Skilled nursing-short term rehab (<3 hours/day)     Assistance Recommended at Discharge Frequent or constant Supervision/Assistance  Patient can return home with the following  A little help with walking and/or transfers;A little help with bathing/dressing/bathroom;Direct supervision/assist for financial management;Help with stairs or ramp for entrance;Assist for transportation;Direct supervision/assist for medications management;Assistance with cooking/housework   Equipment Recommendations  None recommended by OT    Recommendations for Other Services      Precautions / Restrictions Precautions Precautions: Fall Precaution Comments: incontinent B&B, monitor HR Restrictions Weight Bearing Restrictions: No       Mobility Bed Mobility Overal bed mobility: Needs  Assistance       Supine to sit: Supervision          Transfers                         Balance Overall balance assessment: Mild deficits observed, not formally tested                                         ADL either performed or assessed with clinical judgement   ADL Overall ADL's : Needs assistance/impaired Eating/Feeding: Set up;Sitting Eating/Feeding Details (indicate cue type and reason): patient's daughter reported that patient was unable to feed herself. patient was seen during breakfast with patient able to scoop and spear items and bring to mouth with 25% of time noted to have food fall off utensil with patient bringing to mouth anyway. anticipate the issue is more visual than physical with education to daughter and patient on how to increase ammount of food on items makes it from plate to mouth. patients daughter verbalized understanding. patient reported she does not like food. patient was educated on five bite rule for each meal. patient reported she would try     Upper Body Bathing: Set up;Sitting Upper Body Bathing Details (indicate cue type and reason): on [facility] with encouragement to participate in task Lower Body Bathing: Moderate assistance;Sitting/lateral leans Lower Body Bathing Details (indicate cue type and reason): on 3 in1 commode         Toilet Transfer: Minimal assistance;Ambulation;Rolling walker (2 wheels) Toilet Transfer Details (indicate cue type and reason): with increaed time and use of RW cues for proper hand placement. Toileting- Clothing Manipulation and Hygiene: Total assistance;Sit  to/from stand Toileting - Water quality scientist Details (indicate cue type and reason): needed BUE support to maintain standing balance.              Cognition Arousal/Alertness: Awake/alert Behavior During Therapy: WFL for tasks assessed/performed Overall Cognitive Status: Within Functional Limits for tasks assessed                                                      Pertinent Vitals/ Pain       Pain Assessment Pain Assessment: No/denies pain         Frequency  Min 2X/week        Progress Toward Goals  OT Goals(current goals can now be found in the care plan section)  Progress towards OT goals: Progressing toward goals     Plan Discharge plan needs to be updated       AM-PAC OT "6 Clicks" Daily Activity     Outcome Measure   Help from another person eating meals?: A Little Help from another person taking care of personal grooming?: A Little Help from another person toileting, which includes using toliet, bedpan, or urinal?: A Lot Help from another person bathing (including washing, rinsing, drying)?: A Lot Help from another person to put on and taking off regular upper body clothing?: A Little Help from another person to put on and taking off regular lower body clothing?: A Lot 6 Click Score: 15    End of Session Equipment Utilized During Treatment: Gait belt;Rolling walker (2 wheels)  OT Visit Diagnosis: Unsteadiness on feet (R26.81);Other abnormalities of gait and mobility (R26.89);Muscle weakness (generalized) (M62.81)   Activity Tolerance Patient tolerated treatment well   Patient Left in chair;with call bell/phone within reach;with chair alarm set;with family/visitor present   Nurse Communication Mobility status        Time: 0300-9233 OT Time Calculation (min): 29 min  Charges: OT General Charges $OT Visit: 1 Visit OT Treatments $Self Care/Home Management : 23-37 mins  Julia Plowman, MS Acute Rehabilitation Department Office# [phone]   Julia Hampton 10/27/2022, 9:16 AM

## 2022-10-27 NOTE — Assessment & Plan Note (Signed)
Continue azithromycin.  

## 2022-10-27 NOTE — Progress Notes (Signed)
  Progress Note   Patient: Julia Hampton [id number]:[id number] DOB: November 11, 1929 DOA: 10/24/2022     2 DOS: the patient was seen and examined on 10/27/2022 at 11:02AM      Brief hospital course: Mrs. Tschida is a 89 y.o. F with HTN, malnutrition and pulmonary nodules who presented with 1 week right leg swelling then fall.   In the ER, US showed RLE DVT and CTA chest showed bilateral PE.       Assessment and Plan: * Acute pulmonary embolism (HCC) - Continue Eliquis  Cognitive impairment Likely dementia.  DVT (deep venous thrombosis) ([place]) See above  Fall at home, initial encounter    Glaucoma - Continue eye drops  Underweight Malnutrition ruled out.  Multifocal pneumonia - Continue azithromycin  HTN (hypertension) BP elevated - Resume amlodipine          Subjective: No respiratory distress, no fever, no pain complaints, no nursing concerns.     Physical Exam: BP 132/68 (BP Location: Right Arm)   Pulse 89   Temp 98.4 F (36.9 C) (Oral)   Resp 20   Ht 4\' 11"  (1.499 m)   Wt 38.6 kg   SpO2 99%   BMI 17.17 kg/m   Thin elderly female, sitting up in recliner, interactive and appropriate, confused RRR, no murmurs, no peripheral edema Respiratory rate normal, lungs clear without rales or wheezes Abdomen Attention diminished, memory impaired at baseline, face symmetric, speech fluent     Data Reviewed: Basic metabolic panel unremarkable, hemogram shows stable anemia  Family Communication: Daughter at the bedside    Disposition: Status is: Inpatient Patient was admitted with pneumonia and pulmonary embolism  These medical conditions are stabilized, she is on oral agents, and stable for discharge when a safe disposition can be arranged        Author: Edwin Dada, MD 10/27/2022 4:31 PM  For on call review [email or web address].

## 2022-10-28 DIAGNOSIS — I2699 Other pulmonary embolism without acute cor pulmonale: Secondary | ICD-10-CM | POA: Diagnosis not present

## 2022-10-28 MED ORDER — CITALOPRAM HYDROBROMIDE 20 MG PO TABS
10.0000 mg | ORAL_TABLET | Freq: Every day | ORAL | Status: DC
  Administered 2022-10-28 – 2022-10-30 (×3): 10 mg via ORAL
  Filled 2022-10-28 (×3): qty 1

## 2022-10-28 NOTE — TOC Progression Note (Addendum)
Transition of Care [facility]) - Progression Note    Patient Details  Name: Julia Hampton MRN: [id number] Date of Birth: 17-Jul-1930  Transition of Care [facility]) CM/SW [place], RN Phone Number:[phone]  10/28/2022, 11:12 AM  Clinical Narrative:    CM spoke with daughter [name] to review bed offers. Daughter is ok with [facility].   1220 Patient has been accepted per Narda Rutherford at [facility] Cm attempted SNF auth via Navi portal but unable to locate patient. CM has called [place] to initiate insurance auth. [place] reference # [id number]  [id number] still pending.   Expected Discharge Plan: Skilled Nursing Facility Barriers to Discharge: SNF Pending bed offer  Expected Discharge Plan and Services In-house Referral: NA Discharge Planning Services: CM Consult Post Acute Care Choice: [place] Living arrangements for the past 2 months: Single Family Home                 DME Arranged: N/A DME Agency: NA       HH Arranged: NA HH Agency: NA         Social Determinants of Health (SDOH) Interventions SDOH Screenings   Food Insecurity: No Food Insecurity (10/24/2022)  Housing: Low Risk  (10/24/2022)  Transportation Needs: No Transportation Needs (10/24/2022)  Utilities: Not At Risk (10/24/2022)  Tobacco Use: Low Risk  (10/24/2022)    Readmission Risk Interventions    10/27/2022    3:07 PM  Readmission Risk Prevention Plan  Post Dischage Appt Complete  Medication Screening Complete  Transportation Screening Complete

## 2022-10-28 NOTE — Progress Notes (Signed)
  Progress Note   Patient: Julia Hampton LSL:[id number] DOB: 1930-01-25 DOA: 10/24/2022     3 DOS: the patient was seen and examined on 10/28/2022 at 11:02AM      Brief hospital course: Mrs. Lambeth is a 89 y.o. F with HTN, malnutrition and pulmonary nodules who presented with 1 week right leg swelling then fall.   In the ER, US showed RLE DVT and CTA chest showed bilateral PE.       Assessment and Plan: * Acute pulmonary embolism (HCC) - Continue Eliquis  Cognitive impairment Likely dementia.  DVT (deep venous thrombosis) ([place]) See above  Fall at home, initial encounter    Glaucoma - Continue eye drops  Underweight Malnutrition ruled out.  Multifocal pneumonia - Continue azithromycin  HTN (hypertension) BP elevated - Resume amlodipine          Subjective: No nursing concerns, no new pain complaints, fever, respiratory symptoms.     Physical Exam: BP 116/79 (BP Location: Right Arm)   Pulse 100   Temp 98.2 F (36.8 C) (Oral)   Resp 20   Ht 4\' 11"  (1.499 m)   Wt 38.6 kg   SpO2 99%   BMI 17.17 kg/m   Thin elderly female, hypoactive, sitting in recliner, mostly does not engage in conversation, seems slightly confused RRR, no murmurs, no peripheral edema Respiratory rate normal, lungs clear without rales or wheezes Abdomen Attention diminished, face symmetric, speech fluent     Data Reviewed: No new labs  Family Communication: Daughter at the bedside    Disposition: Status is: Inpatient Patient was admitted with pneumonia and pulmonary embolism  These medical conditions are stabilized, she is on oral agents, and stable for discharge when a safe disposition can be arranged        Author: Edwin Dada, MD 10/28/2022 4:12 PM  For on call review [email or web address].

## 2022-10-28 NOTE — Plan of Care (Signed)
Patient AOX3, disoriented to date, VSS throughout shift. Denied pain and SOB. Diminished lungs, IS encouraged. All meds given on time as ordered. Pt refused purewick and voided in bed.  Full linen change.  Pt had BM in bedside commode this morning. Full linen changed. POC maintained, will continue to monitor.  Problem: Activity: Goal: Ability to tolerate increased activity will improve Outcome: Progressing   Problem: Clinical Measurements: Goal: Ability to maintain a body temperature in the normal range will improve Outcome: Progressing   Problem: Respiratory: Goal: Ability to maintain adequate ventilation will improve Outcome: Progressing Goal: Ability to maintain a clear airway will improve Outcome: Progressing   Problem: Education: Goal: Knowledge of General Education information will improve Description: Including pain rating scale, medication(s)/side effects and non-pharmacologic comfort measures Outcome: Progressing   Problem: Health Behavior/Discharge Planning: Goal: Ability to manage health-related needs will improve Outcome: Progressing   Problem: Clinical Measurements: Goal: Ability to maintain clinical measurements within normal limits will improve Outcome: Progressing Goal: Will remain free from infection Outcome: Progressing Goal: Diagnostic test results will improve Outcome: Progressing Goal: Respiratory complications will improve Outcome: Progressing Goal: Cardiovascular complication will be avoided Outcome: Progressing   Problem: Activity: Goal: Risk for activity intolerance will decrease Outcome: Progressing   Problem: Nutrition: Goal: Adequate nutrition will be maintained Outcome: Progressing   Problem: Coping: Goal: Level of anxiety will decrease Outcome: Progressing   Problem: Elimination: Goal: Will not experience complications related to bowel motility Outcome: Progressing Goal: Will not experience complications related to urinary  retention Outcome: Progressing   Problem: Pain Managment: Goal: General experience of comfort will improve Outcome: Progressing   Problem: Safety: Goal: Ability to remain free from injury will improve Outcome: Progressing   Problem: Skin Integrity: Goal: Risk for impaired skin integrity will decrease Outcome: Progressing

## 2022-10-28 NOTE — Care Management Important Message (Signed)
Important Message  Patient Details  Name: Julia Hampton MRN: [id number] Date of Birth: 1930/01/26   Medicare Important Message Given:  Yes     Memory Argue 10/28/2022, 1:25 PM

## 2022-10-29 DIAGNOSIS — I2699 Other pulmonary embolism without acute cor pulmonale: Secondary | ICD-10-CM | POA: Diagnosis not present

## 2022-10-29 LAB — CULTURE, BLOOD (ROUTINE X 2)
Culture: NO GROWTH
Culture: NO GROWTH
Special Requests: ADEQUATE

## 2022-10-29 NOTE — TOC Progression Note (Addendum)
Transition of Care [facility]) - Progression Note    Patient Details  Name: Julia Hampton MRN: [id number] Date of Birth: 10/06/1930  Transition of Care [facility]) CM/SW [place], RN Phone Number:[phone]  10/29/2022, 9:42 AM  Clinical Narrative:   Insurance Josem Kaufmann is still pending. CM will continue to follow.   [place] called [place] to follow up on insurance auth  that is still pending in the Navi portal. Auth is pending all info has been received awating review. Per Everlene Balls they are a little backed up but working on it. CM following for approval. Janie with [facility] and daughter [name] both have been updated. Daughter [name] verifies that she will transport patient when discharged.   [place] remains pending. Daughter has been updated.    Expected Discharge Plan: Skilled Nursing Facility Barriers to Discharge: SNF Pending bed offer  Expected Discharge Plan and Services In-house Referral: NA Discharge Planning Services: CM Consult Post Acute Care Choice: [place] Living arrangements for the past 2 months: Single Family Home                 DME Arranged: N/A DME Agency: NA       HH Arranged: NA HH Agency: NA         Social Determinants of Health (SDOH) Interventions SDOH Screenings   Food Insecurity: No Food Insecurity (10/24/2022)  Housing: Low Risk  (10/24/2022)  Transportation Needs: No Transportation Needs (10/24/2022)  Utilities: Not At Risk (10/24/2022)  Tobacco Use: Low Risk  (10/24/2022)    Readmission Risk Interventions    10/27/2022    3:07 PM  Readmission Risk Prevention Plan  Post Dischage Appt Complete  Medication Screening Complete  Transportation Screening Complete

## 2022-10-29 NOTE — Progress Notes (Signed)
Physical Therapy Treatment Patient Details Name: Julia Hampton MRN: [id number] DOB: 1930-02-11 Today's Date: 10/29/2022   History of Present Illness Patient is a 89 year old female who presented after a fall with one week history of RLE edema. Patient was admitted with PE and RLE DVT, and multifocal pneumonia. PMH: glaucoma, cholecystectomy,    PT Comments    Pt received in recliner requesting the restroom; offered to assist ambulation to the bathroom and pt declined citing "my feet hurt," so had pt transfer to [facility], required min guard only, no physical assist, pt able to perform her own pericare. Pt completed seated BUE and BLE exercises in recliner for range of motion with minimal cuing; encouraged additional repetitions outside of therapy session. Inspected R foot and pt's sock was compressing edematous ankle so rolled sock down to relieve skin; RN notified. Discharge destination remains appropriate and we will continue to follow acutely.    Recommendations for follow up therapy are one component of a multi-disciplinary discharge planning process, led by the attending physician.  Recommendations may be updated based on patient status, additional functional criteria and insurance authorization.  Follow Up Recommendations  Skilled nursing-short term rehab (<3 hours/day) Can patient physically be transported by private vehicle: Yes   Assistance Recommended at Discharge Frequent or constant Supervision/Assistance  Patient can return home with the following A little help with walking and/or transfers;A little help with bathing/dressing/bathroom;Assistance with cooking/housework;Assist for transportation;Help with stairs or ramp for entrance   Equipment Recommendations  Rolling walker (2 wheels)    Recommendations for Other Services       Precautions / Restrictions Precautions Precautions: Fall Precaution Comments: incontinent B&B, monitor HR Restrictions Weight Bearing Restrictions: No      Mobility  Bed Mobility               General bed mobility comments: Pt OOB in recliner at entry and exit    Transfers Overall transfer level: Needs assistance Equipment used: 1 person hand held assist Transfers: Sit to/from Stand, Bed to chair/wheelchair/BSC Sit to Stand: Min guard   Step pivot transfers: Min guard       General transfer comment: Min guard for safety with 1HHA, no physical assist required or overt LOB noted, pt with posterior lean. Pt transfered to/from recliner <> BSC.    Ambulation/Gait                   Stairs             Wheelchair Mobility    Modified Rankin (Stroke Patients Only)       Balance Overall balance assessment: Mild deficits observed, not formally tested                                          Cognition Arousal/Alertness: Awake/alert Behavior During Therapy: WFL for tasks assessed/performed Overall Cognitive Status: Within Functional Limits for tasks assessed                                          Exercises General Exercises - Upper Extremity Shoulder Flexion: AROM, Both, 10 reps, Seated Shoulder ABduction: AROM, Both, 10 reps, Seated Shoulder ADduction: AROM, Both, 10 reps, Seated Elbow Flexion: AROM, Both, 10 reps, Seated Elbow Extension: AROM, Both, 10 reps, Seated General Exercises -  Lower Extremity Ankle Circles/Pumps: AROM, Both, 15 reps, Seated Long Arc Quad: AROM, Both, 10 reps, Seated Hip Flexion/Marching: AROM, Both, 10 reps, Seated    General Comments General comments (skin integrity, edema, etc.): HR up to 102.      Pertinent Vitals/Pain Pain Assessment Pain Assessment: No/denies pain    Home Living                          Prior Function            PT Goals (current goals can now be found in the care plan section) Acute Rehab PT Goals Patient Stated Goal: return home with assist PT Goal Formulation: With patient/family Time  For Goal Achievement: 11/08/22 Potential to Achieve Goals: Good Progress towards PT goals: Progressing toward goals    Frequency    Min 3X/week      PT Plan Current plan remains appropriate;Discharge plan needs to be updated    Co-evaluation              AM-PAC PT "6 Clicks" Mobility   Outcome Measure  Help needed turning from your back to your side while in a flat bed without using bedrails?: A Little Help needed moving from lying on your back to sitting on the side of a flat bed without using bedrails?: A Little Help needed moving to and from a bed to a chair (including a wheelchair)?: A Little Help needed standing up from a chair using your arms (e.g., wheelchair or bedside chair)?: A Little Help needed to walk in hospital room?: A Little Help needed climbing 3-5 steps with a railing? : A Lot 6 Click Score: 17    End of Session Equipment Utilized During Treatment: Gait belt Activity Tolerance: Patient tolerated treatment well;No increased pain Patient left: with call bell/phone within reach;with family/visitor present;in chair;with chair alarm set Nurse Communication: Mobility status PT Visit Diagnosis: Unsteadiness on feet (R26.81);Other abnormalities of gait and mobility (R26.89);Muscle weakness (generalized) (M62.81)     Time: 6389-3734 PT Time Calculation (min) (ACUTE ONLY): 12 min  Charges:  $Therapeutic Exercise: 8-22 mins                     Coolidge Breeze, PT, DPT [facility] Department Office: [phone] Weekend pager: [phone]   Coolidge Breeze 10/29/2022, 3:11 PM

## 2022-10-29 NOTE — Progress Notes (Signed)
PT Cancellation Note  Patient Details Name: Julia Hampton MRN: [id number] DOB: December 06, 1929   Cancelled Treatment:    Reason Eval/Treat Not Completed: (P) Patient declined, no reason specified Pt eating lunch and requesting return later. Will follow up as schedule and pt status allows.  Coolidge Breeze, PT, DPT [place] [facility] Department Office: [phone] Weekend pager: [phone]   Coolidge Breeze 10/29/2022, 12:03 PM

## 2022-10-29 NOTE — Progress Notes (Signed)
  Progress Note   Patient: Julia Hampton GOT:[id number] DOB: 27-Apr-1930 DOA: 10/24/2022     4 DOS: the patient was seen and examined on 10/29/2022 at 9:34AM      Brief hospital course: Julia Hampton is a 89 y.o. F with HTN, malnutrition and pulmonary nodules who presented with 1 week right leg swelling then fall.   In the ER, US showed RLE DVT and CTA chest showed bilateral PE.       Assessment and Plan: * Acute pulmonary embolism ([place]) Echo normal, not requiring O2.  Admitted on heparin, transitioned to Eliquis. - Continue Eliquis  Cognitive impairment Likely dementia.  DVT (deep venous thrombosis) ([place]) See above  Fall at home, initial encounter    Glaucoma - Continue eye drops  Underweight Malnutrition ruled out.  Multifocal pneumonia Completed 5 days azithromycin.  Taking orals.  Temp < 100 F, heart rate < 100bpm, RR < 24, SpO2 at baseline.        HTN (hypertension) BP normal - Continue amlodipine          Subjective: No new nursing concerns, no new complaints, no fever, no respiratory symptoms     Physical Exam: BP 100/72 (BP Location: Right Arm)   Pulse 86   Temp 97.9 F (36.6 C) (Oral)   Resp 18   Ht 4\' 11"  (1.499 m)   Wt 38.6 kg   SpO2 96%   BMI 17.17 kg/m   Thin elderly female, hypoactive, sitting up in recliner, pleasant and interactive, seems slightly confused   RRR, no murmurs, no peripheral edema Respiratory exam benign Attention diminished, face symmetric, speech fluent     Data Reviewed: No new labs  Family Communication: None present    Disposition: Status is: Inpatient Patient was admitted with pneumonia and pulmonary embolism  These medical conditions are stabilized, she is on oral agents, and stable for discharge when a safe disposition can be arranged        Author: Edwin Dada, MD 10/29/2022 12:53 PM  For on call review [email or web address].

## 2022-10-30 DIAGNOSIS — R278 Other lack of coordination: Secondary | ICD-10-CM | POA: Diagnosis not present

## 2022-10-30 DIAGNOSIS — R636 Underweight: Secondary | ICD-10-CM | POA: Diagnosis not present

## 2022-10-30 DIAGNOSIS — R531 Weakness: Secondary | ICD-10-CM | POA: Diagnosis not present

## 2022-10-30 DIAGNOSIS — Y92129 Unspecified place in nursing home as the place of occurrence of the external cause: Secondary | ICD-10-CM | POA: Diagnosis not present

## 2022-10-30 DIAGNOSIS — I2699 Other pulmonary embolism without acute cor pulmonale: Secondary | ICD-10-CM | POA: Diagnosis not present

## 2022-10-30 DIAGNOSIS — I82401 Acute embolism and thrombosis of unspecified deep veins of right lower extremity: Secondary | ICD-10-CM | POA: Diagnosis not present

## 2022-10-30 DIAGNOSIS — S0990XA Unspecified injury of head, initial encounter: Secondary | ICD-10-CM | POA: Diagnosis present

## 2022-10-30 DIAGNOSIS — M47812 Spondylosis without myelopathy or radiculopathy, cervical region: Secondary | ICD-10-CM | POA: Diagnosis not present

## 2022-10-30 DIAGNOSIS — R2681 Unsteadiness on feet: Secondary | ICD-10-CM | POA: Diagnosis not present

## 2022-10-30 DIAGNOSIS — S0101XA Laceration without foreign body of scalp, initial encounter: Secondary | ICD-10-CM | POA: Diagnosis not present

## 2022-10-30 DIAGNOSIS — R293 Abnormal posture: Secondary | ICD-10-CM | POA: Diagnosis not present

## 2022-10-30 DIAGNOSIS — I82409 Acute embolism and thrombosis of unspecified deep veins of unspecified lower extremity: Secondary | ICD-10-CM | POA: Diagnosis not present

## 2022-10-30 DIAGNOSIS — J189 Pneumonia, unspecified organism: Secondary | ICD-10-CM | POA: Diagnosis not present

## 2022-10-30 DIAGNOSIS — Z7401 Bed confinement status: Secondary | ICD-10-CM | POA: Diagnosis not present

## 2022-10-30 DIAGNOSIS — M6281 Muscle weakness (generalized): Secondary | ICD-10-CM | POA: Diagnosis not present

## 2022-10-30 DIAGNOSIS — H409 Unspecified glaucoma: Secondary | ICD-10-CM | POA: Diagnosis not present

## 2022-10-30 DIAGNOSIS — Z9181 History of falling: Secondary | ICD-10-CM | POA: Diagnosis not present

## 2022-10-30 DIAGNOSIS — J188 Other pneumonia, unspecified organism: Secondary | ICD-10-CM | POA: Diagnosis not present

## 2022-10-30 DIAGNOSIS — M542 Cervicalgia: Secondary | ICD-10-CM | POA: Diagnosis not present

## 2022-10-30 DIAGNOSIS — R4189 Other symptoms and signs involving cognitive functions and awareness: Secondary | ICD-10-CM | POA: Diagnosis not present

## 2022-10-30 DIAGNOSIS — R58 Hemorrhage, not elsewhere classified: Secondary | ICD-10-CM | POA: Diagnosis not present

## 2022-10-30 DIAGNOSIS — S199XXA Unspecified injury of neck, initial encounter: Secondary | ICD-10-CM | POA: Diagnosis not present

## 2022-10-30 DIAGNOSIS — W19XXXD Unspecified fall, subsequent encounter: Secondary | ICD-10-CM | POA: Diagnosis not present

## 2022-10-30 DIAGNOSIS — J929 Pleural plaque without asbestos: Secondary | ICD-10-CM | POA: Diagnosis not present

## 2022-10-30 DIAGNOSIS — Z7901 Long term (current) use of anticoagulants: Secondary | ICD-10-CM | POA: Diagnosis not present

## 2022-10-30 DIAGNOSIS — I10 Essential (primary) hypertension: Secondary | ICD-10-CM | POA: Diagnosis not present

## 2022-10-30 DIAGNOSIS — X58XXXA Exposure to other specified factors, initial encounter: Secondary | ICD-10-CM | POA: Diagnosis not present

## 2022-10-30 MED ORDER — MIRTAZAPINE 7.5 MG PO TABS: 7.5000 mg | ORAL_TABLET | Freq: Every day | ORAL | Status: AC

## 2022-10-30 MED ORDER — APIXABAN 5 MG PO TABS: 10.0000 mg | ORAL_TABLET | Freq: Two times a day (BID) | ORAL | Status: DC

## 2022-10-30 MED ORDER — APIXABAN 5 MG PO TABS: 5.0000 mg | ORAL_TABLET | Freq: Two times a day (BID) | ORAL | Status: DC

## 2022-10-30 NOTE — TOC Transition Note (Signed)
Transition of Care [facility]) - CM/SW Discharge Note   Patient Details  Name: Julia Hampton MRN: [id number] Date of Birth: 1930-03-16  [facility] CM/SW Contact:  Angelita Ingles, RN Phone Number:[phone]  10/30/2022, 9:53 AM   Clinical Narrative:    Insurance auth received from [employer] Ref # [id number] [facility]KK #[id number] good for 5 days starting 1/11. Janie with [facility] has been made aware. Message sent to MD. D/c summary and orders are in. Daughter [name] has been updated and will transport patient to facility. Discharge summary faxed to [facility].   Please call report to  [facility]  [phone] Room # 3214     Barriers to Discharge: SNF Pending bed offer   Patient Goals and CMS Choice CMS Medicare.gov Compare Post Acute Care list provided to:: Other (Comment Required) (daughter [name]) Choice offered to / list presented to : Adult Children  Discharge Placement                         Discharge Plan and Services Additional resources added to the After Visit Summary for   In-house Referral: NA Discharge Planning Services: CM Consult Post Acute Care Choice: [place]          DME Arranged: N/A DME Agency: NA       HH Arranged: NA HH Agency: NA        Social Determinants of Health (SDOH) Interventions SDOH Screenings   Food Insecurity: No Food Insecurity (10/24/2022)  Housing: Low Risk  (10/24/2022)  Transportation Needs: No Transportation Needs (10/24/2022)  Utilities: Not At Risk (10/24/2022)  Tobacco Use: Low Risk  (10/24/2022)     Readmission Risk Interventions    10/27/2022    3:07 PM  Readmission Risk Prevention Plan  Post Dischage Appt Complete  Medication Screening Complete  Transportation Screening Complete

## 2022-10-30 NOTE — Plan of Care (Signed)
Patient AOX3, disoriented to date, VSS throughout shift. Denied pain and SOB. Diminished lungs, IS encouraged. All meds given on time as ordered. Pt refused purewick and voided at bedside commode as standby assist.  Full linen change.  Pt had BM in bedside commode this morning. POC maintained, will continue to monitor   Problem: Activity: Goal: Ability to tolerate increased activity will improve Outcome: Progressing   Problem: Clinical Measurements: Goal: Ability to maintain a body temperature in the normal range will improve Outcome: Progressing   Problem: Respiratory: Goal: Ability to maintain adequate ventilation will improve Outcome: Progressing Goal: Ability to maintain a clear airway will improve Outcome: Progressing   Problem: Education: Goal: Knowledge of General Education information will improve Description: Including pain rating scale, medication(s)/side effects and non-pharmacologic comfort measures Outcome: Progressing   Problem: Health Behavior/Discharge Planning: Goal: Ability to manage health-related needs will improve Outcome: Progressing   Problem: Clinical Measurements: Goal: Ability to maintain clinical measurements within normal limits will improve Outcome: Progressing Goal: Will remain free from infection Outcome: Progressing Goal: Diagnostic test results will improve Outcome: Progressing Goal: Respiratory complications will improve Outcome: Progressing Goal: Cardiovascular complication will be avoided Outcome: Progressing   Problem: Activity: Goal: Risk for activity intolerance will decrease Outcome: Progressing   Problem: Nutrition: Goal: Adequate nutrition will be maintained Outcome: Progressing   Problem: Coping: Goal: Level of anxiety will decrease Outcome: Progressing   Problem: Elimination: Goal: Will not experience complications related to bowel motility Outcome: Progressing Goal: Will not experience complications related to urinary  retention Outcome: Progressing   Problem: Pain Managment: Goal: General experience of comfort will improve Outcome: Progressing   Problem: Safety: Goal: Ability to remain free from injury will improve Outcome: Progressing   Problem: Skin Integrity: Goal: Risk for impaired skin integrity will decrease Outcome: Progressing

## 2022-10-30 NOTE — Discharge Summary (Signed)
Physician Discharge Summary   Patient: Julia Hampton MRN: [id number] DOB: Dec 03, 1929  Admit date:     10/24/2022  Discharge date: 10/30/22  Discharge Physician: Edwin Dada   PCP: Julia Loader, FNP     Recommendations at discharge:  Take apixaban/Eliquis 10 mg twice daily for 3 more days On Jan 15, reduce to apixaban/Eliquis 5 mg twice daily for 3-6 months Follow up with PCP Julia Hampton 1 week after discharge from SNF     Discharge Diagnoses: Principal Problem:   Acute pulmonary embolism ([place]) Active Problems:   HTN (hypertension)   Multifocal pneumonia   Underweight   Glaucoma   Fall at home, initial encounter   DVT (deep venous thrombosis) ([place])   Cognitive impairment      Hospital Course: Julia Hampton is a 89 y.o. F with HTN, malnutrition and pulmonary nodules who presented with 1 week right leg swelling then fall.   In the ER, US showed RLE DVT and CTA chest showed bilateral PE.     * Acute pulmonary embolism ([place]) DVT (deep venous thrombosis) ([place]) Admitted and started on heparin.  Echo normal, not requiring O2.    Transitioned to Eliquis.       Cognitive impairment Likely dementia.    Glaucoma Continue eye drops  Underweight Malnutrition ruled out.  Multifocal pneumonia Treated with 5 days antibiotics.  HTN (hypertension) Stable on amlodipine.            The [facility] Controlled Substances Registry was reviewed for this patient prior to discharge.   Consultants: None Procedures performed: CTA chest  Disposition: Skilled nursing facility   DISCHARGE MEDICATION: Allergies as of 10/30/2022       Reactions   Other Other (See Comments)   Glaucoma drop with a dark green top - not sure of name -        Medication List     STOP taking these medications    megestrol 625 MG/5ML suspension Commonly known as: MEGACE ES   memantine 5 MG tablet Commonly known as: NAMENDA       TAKE these medications     acetaminophen 500 MG tablet Commonly known as: TYLENOL Take 500 mg by mouth every 6 (six) hours as needed for mild pain or moderate pain.   amLODipine 5 MG tablet Commonly known as: NORVASC Take 5 mg by mouth daily.   apixaban 5 MG Tabs tablet Commonly known as: ELIQUIS Take 2 tablets (10 mg total) by mouth 2 (two) times daily for 3 days.   apixaban 5 MG Tabs tablet Commonly known as: ELIQUIS Take 1 tablet (5 mg total) by mouth 2 (two) times daily. Start taking on: November 02, 9290   bismuth subsalicylate 446 KM/63OT suspension Commonly known as: Pepto-Bismol Take 30 mLs by mouth every 6 (six) hours as needed. What changed: reasons to take this   brimonidine 0.2 % ophthalmic solution Commonly known as: ALPHAGAN Place 1 drop into the right eye 2 (two) times daily.   citalopram 20 MG tablet Commonly known as: CELEXA Take 20 mg by mouth daily.   dorzolamide-timolol 2-0.5 % ophthalmic solution Commonly known as: COSOPT Place 1 drop into both eyes 2 (two) times daily.   feeding supplement Liqd Take 237 mLs by mouth 3 (three) times daily between meals.   latanoprost 0.005 % ophthalmic solution Commonly known as: XALATAN Place 1 drop into the right eye at bedtime.   mirtazapine 7.5 MG tablet Commonly known as: REMERON Take 1 tablet (7.5  mg total) by mouth at bedtime.   pilocarpine 4 % ophthalmic solution Commonly known as: PILOCAR Place 1 drop into the right eye 2 (two) times daily.        Follow-up Information     Julia Loader, FNP Follow up.   Specialty: Family Medicine Why: 1 week after discharge from SNF Contact information: [place] [address] [phone]                   Discharge Exam: Danley Danker Weights   10/24/22 0755  Weight: 38.6 kg    General: Pt is alert, awake, not in acute distress Cardiovascular: RRR, nl S1-S2, no murmurs appreciated.   No LE edema.   Respiratory: Normal respiratory rate and rhythm.   CTAB without rales or wheezes. Abdominal: Abdomen soft and non-tender.  No distension or HSM.   Neuro/Psych: Strength symmetric in upper and lower extremities.  Judgment and insight appear normal.   Condition at discharge: stable  The results of significant diagnostics from this hospitalization (including imaging, microbiology, ancillary and laboratory) are listed below for reference.   Imaging Studies: ECHOCARDIOGRAM COMPLETE  Result Date: 10/25/2022    ECHOCARDIOGRAM REPORT   Patient Name:   Julia Hampton Date of Exam: 10/25/2022 Medical Rec #:  [id number]       Height:       59.0 in Accession #:    [id number]      Weight:       85.0 lb Date of Birth:  [birth year]        BSA:          1.282 m Patient Age:    89 years        BP:           110/77 mmHg Patient Gender: F               HR:           98 bpm. Exam Location:  Inpatient Procedure: 2D Echo, Cardiac Doppler, Color Doppler and Intracardiac            Opacification Agent Indications:    Pulmonary embolus  History:        Patient has no prior history of Echocardiogram examinations.                 Signs/Symptoms:Edema. No pertinent medical hx on file.  Sonographer:    Eartha Inch Referring Phys: [id number] Julia Hampton  Sonographer Comments: Technically challenging study due to limited acoustic windows. Image acquisition challenging due to patient body habitus and Image acquisition challenging due to respiratory motion. IMPRESSIONS  1. Left ventricular ejection fraction, by estimation, is 65 to 70%. The left ventricle has normal function. The left ventricle has no regional wall motion abnormalities. There is moderate left ventricular hypertrophy. Left ventricular diastolic parameters are indeterminate. Elevated left ventricular end-diastolic pressure.  2. Right ventricular systolic function is normal. The right ventricular size is normal. There is normal pulmonary artery systolic pressure.  3. The mitral valve is grossly normal. Trivial mitral valve  regurgitation. No evidence of mitral stenosis.  4. The aortic valve is tricuspid. There is mild calcification of the aortic valve. There is mild thickening of the aortic valve. Aortic valve regurgitation is trivial. Aortic valve sclerosis is present, with no evidence of aortic valve stenosis.  5. The inferior vena cava is normal in size with greater than 50% respiratory variability, suggesting right atrial pressure of 3 mmHg. Comparison(s): No  prior Echocardiogram. Conclusion(s)/Recommendation(s): Normal biventricular function without evidence of hemodynamically significant valvular heart disease. FINDINGS  Left Ventricle: Left ventricular ejection fraction, by estimation, is 65 to 70%. The left ventricle has normal function. The left ventricle has no regional wall motion abnormalities. Definity contrast agent was given IV to delineate the left ventricular  endocardial borders. The left ventricular internal cavity size was small. There is moderate left ventricular hypertrophy. Left ventricular diastolic parameters are indeterminate. Elevated left ventricular end-diastolic pressure. Right Ventricle: The right ventricular size is normal. Right vetricular wall thickness was not well visualized. Right ventricular systolic function is normal. There is normal pulmonary artery systolic pressure. The tricuspid regurgitant velocity is 2.26 m/s, and with an assumed right atrial pressure of 3 mmHg, the estimated right ventricular systolic pressure is 83.3 mmHg. Left Atrium: Left atrial size was normal in size. Right Atrium: Right atrial size was normal in size. Pericardium: There is no evidence of pericardial effusion. Mitral Valve: The mitral valve is grossly normal. Trivial mitral valve regurgitation. No evidence of mitral valve stenosis. MV peak gradient, 9.4 mmHg. The mean mitral valve gradient is 3.0 mmHg. Tricuspid Valve: The tricuspid valve is not well visualized. Tricuspid valve regurgitation is trivial. No evidence of  tricuspid stenosis. Aortic Valve: The aortic valve is tricuspid. There is mild calcification of the aortic valve. There is mild thickening of the aortic valve. Aortic valve regurgitation is trivial. Aortic valve sclerosis is present, with no evidence of aortic valve stenosis. Pulmonic Valve: The pulmonic valve was grossly normal. Pulmonic valve regurgitation is not visualized. No evidence of pulmonic stenosis. Aorta: The aortic root, ascending aorta and aortic arch are all structurally normal, with no evidence of dilitation or obstruction. Venous: The inferior vena cava is normal in size with greater than 50% respiratory variability, suggesting right atrial pressure of 3 mmHg. IAS/Shunts: The atrial septum is grossly normal.  LEFT VENTRICLE PLAX 2D LVIDd:         2.69 cm     Diastology LVIDs:         1.77 cm     LV e' medial:    3.15 cm/s LV PW:         1.00 cm     LV E/e' medial:  22.4 LV IVS:        1.47 cm     LV e' lateral:   3.92 cm/s LVOT diam:     1.80 cm     LV E/e' lateral: 18.0 LV SV:         78 LV SV Index:   61 LVOT Area:     2.54 cm  LV Volumes (MOD) LV vol d, MOD A2C: 33.4 ml LV vol d, MOD A4C: 51.2 ml LV vol s, MOD A2C: 5.2 ml LV vol s, MOD A4C: 11.6 ml LV SV MOD A2C:     28.2 ml LV SV MOD A4C:     51.2 ml LV SV MOD BP:      34.4 ml RIGHT VENTRICLE             IVC RV S prime:     23.50 cm/s  IVC diam: 1.30 cm TAPSE (M-mode): 1.3 cm LEFT ATRIUM             Index       RIGHT ATRIUM          Index LA diam:        2.80 cm 2.18 cm/m  RA Area:     9.88  cm LA Vol (A2C):   10.7 ml 8.35 ml/m  RA Volume:   18.10 ml 14.12 ml/m LA Vol (A4C):   11.4 ml 8.89 ml/m LA Biplane Vol: 11.5 ml 8.97 ml/m  AORTIC VALVE LVOT Vmax:   168.00 cm/s LVOT Vmean:  135.000 cm/s LVOT VTI:    0.305 m  AORTA Ao Root diam: 3.10 cm Ao Asc diam:  3.30 cm MITRAL VALVE                TRICUSPID VALVE MV Area (PHT): 2.71 cm     TR Peak grad:   20.4 mmHg MV Area VTI:   3.37 cm     TR Vmax:        226.00 cm/s MV Peak grad:  9.4 mmHg MV  Mean grad:  3.0 mmHg     SHUNTS MV Vmax:       1.53 m/s     Systemic VTI:  0.30 m MV Vmean:      83.1 cm/s    Systemic Diam: 1.80 cm MV Decel Time: 280 msec MR Peak grad: 13.7 mmHg MR Vmax:      185.00 cm/s MV E velocity: 70.50 cm/s MV A velocity: 137.00 cm/s MV E/A ratio:  0.51 Buford Dresser MD Electronically signed by Buford Dresser MD Signature Date/Time: 10/25/2022/12:29:58 PM    Final    VAS [place] LOWER EXTREMITY VENOUS (DVT) (ONLY [facility] & [facility])  Result Date: 10/24/2022  Lower Venous DVT Study Patient Name:  OLIVEAH ZWACK  Date of Exam:   10/24/2022 Medical Rec #: [id number]        Accession #:    [id number] Date of Birth: 15-Oct-1930         Patient Gender: F Patient Age:   62 years Exam Location:  [facility] Procedure:      VAS [place] LOWER EXTREMITY VENOUS (DVT) Referring Phys: Benjamine Mola REES --------------------------------------------------------------------------------  Indications: Pulmonary embolism.  Comparison Study: 11-15-2018 Prior right lower extremity venous study was                   negative for DVT Performing Technologist: Darlin Coco RDMS, RVT  Examination Guidelines: A complete evaluation includes B-mode imaging, spectral Doppler, color Doppler, and power Doppler as needed of all accessible portions of each vessel. Bilateral testing is considered an integral part of a complete examination. Limited examinations for reoccurring indications may be performed as noted. The reflux portion of the exam is performed with the patient in reverse Trendelenburg.  +---------+---------------+---------+-----------+---------------+--------------+ RIGHT    CompressibilityPhasicitySpontaneityProperties     Thrombus Aging +---------+---------------+---------+-----------+---------------+--------------+ CFV      Partial        Yes      Yes        Mobile thrombusAcute                                                      extension into                                                             distal CFV                    +---------+---------------+---------+-----------+---------------+--------------+  SFJ      Full                                                             +---------+---------------+---------+-----------+---------------+--------------+ FV Prox  None           No       No                        Acute          +---------+---------------+---------+-----------+---------------+--------------+ FV Mid   Partial        Yes      Yes                       Acute          +---------+---------------+---------+-----------+---------------+--------------+ FV DistalPartial        Yes      Yes                       Acute          +---------+---------------+---------+-----------+---------------+--------------+ PFV      Full                                                             +---------+---------------+---------+-----------+---------------+--------------+ POP      None           No       No                        Acute          +---------+---------------+---------+-----------+---------------+--------------+ PTV      None           No       No                        Acute          +---------+---------------+---------+-----------+---------------+--------------+ PERO     Partial        Yes      Yes                       Acute          +---------+---------------+---------+-----------+---------------+--------------+   +----+---------------+---------+-----------+----------+--------------+ LEFTCompressibilityPhasicitySpontaneityPropertiesThrombus Aging +----+---------------+---------+-----------+----------+--------------+ CFV Full           Yes      Yes                                 +----+---------------+---------+-----------+----------+--------------+     Summary: RIGHT: - Findings consistent with acute deep vein thrombosis involving the right common femoral vein, right femoral vein, right popliteal vein, right  posterior tibial veins, and right peroneal veins. Poorly attached, mobile thrombus observed extending into distal common femoral vein.  - No cystic structure found in the popliteal fossa.  LEFT: - No evidence of common femoral vein obstruction.  *See table(s) above for measurements and observations. Electronically signed by Vonna Kotyk  Robins on 10/24/2022 at 1:10:45 PM.    Final    CT Angio Chest PE W/Cm &/Or Wo Cm  Result Date: 10/24/2022 CLINICAL DATA:  Fall from toilet. EXAM: CT ANGIOGRAPHY CHEST WITH CONTRAST TECHNIQUE: Multidetector CT imaging of the chest was performed using the standard protocol during bolus administration of intravenous contrast. Multiplanar CT image reconstructions and MIPs were obtained to evaluate the vascular anatomy. RADIATION DOSE REDUCTION: This exam was performed according to the departmental dose-optimization program which includes automated exposure control, adjustment of the mA and/or kV according to patient size and/or use of iterative reconstruction technique. CONTRAST:  11m OMNIPAQUE IOHEXOL 350 MG/ML SOLN COMPARISON:  Report from outside hospital 08/03/2021 FINDINGS: Cardiovascular: Satisfactory opacification of the pulmonary arteries to the segmental level. Branching pulmonary embolism within segmental and left lower lobar pulmonary arteries, nonocclusive. Clot also seen within segmental branches of the right upper lobe, possibly subacute given the somewhat peripheral appearance. Insufficient visible clot burden for right heart strain. Atheromatous calcification of the aorta. Mediastinum/Nodes: Unremarkable esophagus.  No adenopathy Lungs/Pleura: Reticular and nodular opacities in the lingula and lower lobes with areas of mucoid airway impaction. Mild cylindrical bronchiectasis in the lingula. Nodules are mainly clustered and inflammatory pattern, measuring up to 11 mm in the right lower lobe on 11:62. Area of confluent consolidation in the subpleural left lower lobe, see  11:67. Biapical pleural based scarring. Upper Abdomen: No acute finding. 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 Cholecystectomy which accounts for bile duct prominence. Musculoskeletal: Degenerative change without acute finding. Review of the MIP images confirms the above findings. Critical Value/emergent results were called by telephone at the time of interpretation on 10/24/2022 at 6:44 am [facility]ally acknowledged these results. IMPRESSION: 1. Positive for lobar and segmental pulmonary embolism in the left lower lobe. Smaller embolism at right upper lobe segmental level. 2. Patchy airspace disease with nodules and mild bronchiectasis, suspect a combination of chronic lung disease (such as MAC) and acute pneumonia. If appropriate for comorbidities a chest CT in 2-3 months may be useful. Electronically Signed   By: JJorje GuildM.D.   On: 10/24/2022 06:50   DG Pelvis Portable  Result Date: 10/24/2022 CLINICAL DATA:  Fall EXAM: PORTABLE PELVIS 1-2 VIEWS COMPARISON:  None Available. FINDINGS: Status post left hip bipolar hemiarthroplasty. Normal alignment. No acute fracture or dislocation. Moderate right hip degenerative arthritis. Soft tissues are unremarkable. IMPRESSION: 1. Left hip bipolar hemiarthroplasty. No acute fracture or dislocation. Electronically Signed   By: AFidela SalisburyM.D.   On: 10/24/2022 04:06   DG Chest Port 1 View  Result Date: 10/24/2022 CLINICAL DATA:  Fall EXAM: PORTABLE CHEST 1 VIEW COMPARISON:  01/22/2017 FINDINGS: The lungs appear mildly hyperinflated suggesting changes of underlying COPD. Biapical parenchymal scarring noted. Multiple amorphous opacities overlying the cardiac silhouette likely represent costochondral calcification. No pneumothorax or pleural effusion. Cardiac size within normal limits. Pulmonary vascularity is normal. No acute bone abnormality. IMPRESSION: 1. No active disease. COPD. Electronically Signed    By: AFidela SalisburyM.D.   On: 10/24/2022 04:04   CT Head Wo Contrast  Result Date: 10/24/2022 CLINICAL DATA:  Fall with head is EXAM: CT HEAD WITHOUT CONTRAST CT CERVICAL SPINE WITHOUT CONTRAST TECHNIQUE: Multidetector CT imaging of the head and cervical spine was performed following the standard protocol without intravenous contrast. Multiplanar CT image reconstructions of the cervical spine were also generated. RADIATION DOSE REDUCTION: This exam was performed according to the departmental  dose-optimization program which includes automated exposure control, adjustment of the mA and/or kV according to patient size and/or use of iterative reconstruction technique. COMPARISON:  None Available. FINDINGS: CT HEAD FINDINGS Brain: No evidence of acute infarction, hemorrhage, hydrocephalus, extra-axial collection or mass lesion/mass effect. Generalized atrophy Vascular: No hyperdense vessel or unexpected calcification. Skull: Posterior scalp laceration.  No calvarial fracture. Sinuses/Orbits: No evidence of injury CT CERVICAL SPINE FINDINGS Alignment: Hyperlordosis Skull base and vertebrae: Generalized osteopenia. No acute fracture or incidental bone lesion Soft tissues and spinal canal: No prevertebral fluid or swelling. No visible canal hematoma. Disc levels:  Ordinary degenerative change for age Upper chest: Biapical pleural based scarring. IMPRESSION: 1. No evidence of acute intracranial or cervical spine injury. 2. Posterior scalp laceration without calvarial fracture. Electronically Signed   By: Jorje Guild M.D.   On: 10/24/2022 03:58   CT Cervical Spine Wo Contrast  Result Date: 10/24/2022 CLINICAL DATA:  Fall with head is EXAM: CT HEAD WITHOUT CONTRAST CT CERVICAL SPINE WITHOUT CONTRAST TECHNIQUE: Multidetector CT imaging of the head and cervical spine was performed following the standard protocol without intravenous contrast. Multiplanar CT image reconstructions of the cervical spine were also  generated. RADIATION DOSE REDUCTION: This exam was performed according to the departmental dose-optimization program which includes automated exposure control, adjustment of the mA and/or kV according to patient size and/or use of iterative reconstruction technique. COMPARISON:  None Available. FINDINGS: CT HEAD FINDINGS Brain: No evidence of acute infarction, hemorrhage, hydrocephalus, extra-axial collection or mass lesion/mass effect. Generalized atrophy Vascular: No hyperdense vessel or unexpected calcification. Skull: Posterior scalp laceration.  No calvarial fracture. Sinuses/Orbits: No evidence of injury CT CERVICAL SPINE FINDINGS Alignment: Hyperlordosis Skull base and vertebrae: Generalized osteopenia. No acute fracture or incidental bone lesion Soft tissues and spinal canal: No prevertebral fluid or swelling. No visible canal hematoma. Disc levels:  Ordinary degenerative change for age Upper chest: Biapical pleural based scarring. IMPRESSION: 1. No evidence of acute intracranial or cervical spine injury. 2. Posterior scalp laceration without calvarial fracture. Electronically Signed   By: Jorje Guild M.D.   On: 10/24/2022 03:58    Microbiology: Results for orders placed or performed during the hospital encounter of 10/24/22  Culture, blood (routine x 2)     Status: None   Collection Time: 10/24/22  7:50 AM   Specimen: BLOOD  Result Value Ref Range Status   Specimen Description   Final    BLOOD BLOOD RIGHT ARM Performed at [facility] [address] [place], [place] [address]    Special Requests   Final    BOTTLES DRAWN AEROBIC AND ANAEROBIC Blood Culture results may not be optimal due to an inadequate volume of blood received in culture bottles Performed at [facility] [address] [place], [place] [address]    Culture   Final    NO GROWTH 5 DAYS Performed at [facility], [place] [address], [place], [place] [address]    Report Status  10/29/2022 FINAL  Final  Culture, blood (routine x 2)     Status: None   Collection Time: 10/24/22  7:50 AM   Specimen: BLOOD  Result Value Ref Range Status   Specimen Description   Final    BLOOD BLOOD LEFT ARM Performed at [facility] [address], [place], [place] [address]    Special Requests   Final    BOTTLES DRAWN AEROBIC AND ANAEROBIC Blood Culture adequate volume Performed at [facility]  [facility], [place] [address], [place], [place] [address]    Culture   Final    NO GROWTH 5 DAYS Performed at [facility], [place] [address], [place], [place] [address]    Report Status 10/29/2022 FINAL  Final    Labs: CBC: Recent Labs  Lab 10/24/22 0434 10/25/22 0901 10/26/22 0640 10/27/22 0720  WBC 13.4* 10.4 7.0 5.9  NEUTROABS 10.7*  --   --  4.0  HGB 14.2 12.2 10.7* 10.9*  HCT 42.9 38.2 33.7* 34.3*  MCV 93.9 97.7 98.5 99.7  PLT 188 222 202 284   Basic Metabolic Panel: Recent Labs  Lab 10/24/22 0434 10/25/22 0901 10/26/22 0640 10/27/22 0720  NA 138 141 139 138  K 4.3 3.7 4.2 4.6  CL 105 112* 110 109  CO2 22 21* 22 23  GLUCOSE 118* 173* 92 93  BUN 49* 36* 26* 23  CREATININE 0.71 0.79 0.56 0.63  CALCIUM 9.2 8.8* 8.5* 8.4*  MG  --   --   --  2.0  PHOS  --   --   --  3.0   Liver Function Tests: Recent Labs  Lab 10/24/22 0434 10/25/22 0901  AST 31 30  ALT 20 17  ALKPHOS 39 34*  BILITOT 1.2 0.9  PROT 6.9 6.4*  ALBUMIN 3.3* 2.9*   CBG: No results for input(s): "GLUCAP" in the last 168 hours.  Discharge time spent: approximately 35 minutes spent on discharge counseling, evaluation of patient on day of discharge, and coordination of discharge planning with nursing, social work, pharmacy and case management  Signed: Edwin Dada, MD [facility] 10/30/2022

## 2022-10-30 NOTE — Progress Notes (Signed)
Report called to Sherlyn Hay, Therapist, sports at [employer]

## 2022-11-02 ENCOUNTER — Encounter (HOSPITAL_COMMUNITY): Payer: Self-pay | Admitting: Pharmacy Technician

## 2022-11-02 ENCOUNTER — Emergency Department (HOSPITAL_COMMUNITY): Payer: Medicare PPO

## 2022-11-02 ENCOUNTER — Emergency Department (HOSPITAL_COMMUNITY)
Admission: EM | Admit: 2022-11-02 | Discharge: 2022-11-02 | Disposition: A | Discharge status: Skilled Nursing Facility | Payer: Medicare PPO | Attending: Emergency Medicine | Admitting: Emergency Medicine

## 2022-11-02 ENCOUNTER — Other Ambulatory Visit: Payer: Self-pay

## 2022-11-02 DIAGNOSIS — Z7901 Long term (current) use of anticoagulants: Secondary | ICD-10-CM | POA: Insufficient documentation

## 2022-11-02 DIAGNOSIS — S0101XA Laceration without foreign body of scalp, initial encounter: Secondary | ICD-10-CM | POA: Insufficient documentation

## 2022-11-02 DIAGNOSIS — Y92129 Unspecified place in nursing home as the place of occurrence of the external cause: Secondary | ICD-10-CM | POA: Diagnosis not present

## 2022-11-02 DIAGNOSIS — X58XXXA Exposure to other specified factors, initial encounter: Secondary | ICD-10-CM | POA: Insufficient documentation

## 2022-11-02 DIAGNOSIS — S0990XA Unspecified injury of head, initial encounter: Secondary | ICD-10-CM | POA: Diagnosis not present

## 2022-11-02 DIAGNOSIS — J929 Pleural plaque without asbestos: Secondary | ICD-10-CM | POA: Diagnosis not present

## 2022-11-02 DIAGNOSIS — M47812 Spondylosis without myelopathy or radiculopathy, cervical region: Secondary | ICD-10-CM | POA: Diagnosis not present

## 2022-11-02 DIAGNOSIS — S199XXA Unspecified injury of neck, initial encounter: Secondary | ICD-10-CM | POA: Diagnosis not present

## 2022-11-02 LAB — CBC
HCT: 29.6 % — ABNORMAL LOW (ref 36.0–46.0)
Hemoglobin: 9.9 g/dL — ABNORMAL LOW (ref 12.0–15.0)
MCH: 32.5 pg (ref 26.0–34.0)
MCHC: 33.4 g/dL (ref 30.0–36.0)
MCV: 97 fL (ref 80.0–100.0)
Platelets: 314 10*3/uL (ref 150–400)
RBC: 3.05 MIL/uL — ABNORMAL LOW (ref 3.87–5.11)
RDW: 15 % (ref 11.5–15.5)
WBC: 8.2 10*3/uL (ref 4.0–10.5)
nRBC: 0 % (ref 0.0–0.2)

## 2022-11-02 LAB — BASIC METABOLIC PANEL
Anion gap: 7 (ref 5–15)
BUN: 24 mg/dL — ABNORMAL HIGH (ref 8–23)
CO2: 21 mmol/L — ABNORMAL LOW (ref 22–32)
Calcium: 8.5 mg/dL — ABNORMAL LOW (ref 8.9–10.3)
Chloride: 107 mmol/L (ref 98–111)
Creatinine, Ser: 0.72 mg/dL (ref 0.44–1.00)
GFR, Estimated: >60 mL/min (ref >60)
Glucose, Bld: 90 mg/dL (ref 70–99)
Potassium: 4.1 mmol/L (ref 3.5–5.1)
Sodium: 135 mmol/L (ref 135–145)

## 2022-11-02 MED ORDER — FENTANYL CITRATE PF 50 MCG/ML IJ SOSY
25.0000 ug | PREFILLED_SYRINGE | Freq: Once | INTRAMUSCULAR | Status: AC
  Administered 2022-11-02: 25 ug via INTRAVENOUS
  Filled 2022-11-02: qty 1

## 2022-11-02 NOTE — Progress Notes (Signed)
Orthopedic Tech Progress Note Patient Details:  Julia Hampton 1930-06-10 [id number] Level 2 Trauma  Patient ID: Julia Hampton, female   DOB: 1930-01-01, 89 y.o.   MRN: [id number]  Julia Hampton 11/02/2022, 7:30 AM

## 2022-11-02 NOTE — ED Notes (Signed)
[place] collar applied 

## 2022-11-02 NOTE — ED Triage Notes (Signed)
Pt bib ems from [facility] with puncture wound to posterior head. Pt unable to recall how the injury happened. EMS reports large amount of blood loss and bleeding controlled by ems. Pt on eliquis.  HR 116 106/70

## 2022-11-02 NOTE — ED Notes (Signed)
Monitored pt for 5 min per MD to ensure bleeding has stopped . No indications of bleeding present. Patient placed onto purwick. Family is at bedside at this time.

## 2022-11-02 NOTE — ED Provider Notes (Signed)
[facility] EMERGENCY DEPARTMENT Provider Note   CSN: [id number] Arrival date & time: 11/02/22  4098     History  Chief Complaint  Patient presents with   Head Injury    Julia Hampton is a 89 y.o. female here from [facility] with head injury.  Patient is on eliquis.  Does not recall what happened but EMS reports patient bleeding from head wound on their arrival.  Bleeding controlled by EMS with bandaging.  Pt discharged to SNF on 10/30/22 after hospitalization for acute PE and multifocal PNA.  Per discharge summary has history of "likely dementia."  PNA treated with antibiotics.  HPI     Home Medications Prior to Admission medications   Medication Sig Start Date End Date Taking? Authorizing Provider  acetaminophen (TYLENOL) 500 MG tablet Take 500 mg by mouth every 6 (six) hours as needed for mild pain or moderate pain.    [provider]  amLODipine (NORVASC) 5 MG tablet Take 5 mg by mouth daily. 09/04/22   [provider]  apixaban (ELIQUIS) 5 MG TABS tablet Take 2 tablets (10 mg total) by mouth 2 (two) times daily for 3 days. 10/30/22 11/02/22  Danford, Earl Lites, MD  apixaban (ELIQUIS) 5 MG TABS tablet Take 1 tablet (5 mg total) by mouth 2 (two) times daily. 11/02/22   Danford, Earl Lites, MD  bismuth subsalicylate (PEPTO-BISMOL) 262 MG/15ML suspension Take 30 mLs by mouth every 6 (six) hours as needed. Patient taking differently: Take 30 mLs by mouth every 6 (six) hours as needed for indigestion. 01/22/17   Lyndal Pulley, MD  brimonidine (ALPHAGAN) 0.2 % ophthalmic solution Place 1 drop into the right eye 2 (two) times daily.    [provider]  citalopram (CELEXA) 20 MG tablet Take 20 mg by mouth daily. 10/23/22   [provider]  dorzolamide-timolol (COSOPT) 2-0.5 % ophthalmic solution Place 1 drop into both eyes 2 (two) times daily.    [provider]  feeding supplement, ENSURE ENLIVE, (ENSURE  ENLIVE) LIQD Take 237 mLs by mouth 3 (three) times daily between meals. 01/17/17   Rolly Salter, MD  latanoprost (XALATAN) 0.005 % ophthalmic solution Place 1 drop into the right eye at bedtime. 09/19/22   [provider]  mirtazapine (REMERON) 7.5 MG tablet Take 1 tablet (7.5 mg total) by mouth at bedtime. 10/30/22   Danford, Earl Lites, MD  pilocarpine (PILOCAR) 4 % ophthalmic solution Place 1 drop into the right eye 2 (two) times daily.    [provider]      Allergies    Other    Review of Systems   Review of Systems  Physical Exam Updated Vital Signs BP (!) 127/96 (BP Location: Right Arm)   Pulse 96   Temp 98.3 F (36.8 C) (Oral)   Resp 16   Ht 4\' 11"  (1.499 m)   Wt 38.6 kg   SpO2 100%   BMI 17.17 kg/m  Physical Exam  ED Results / Procedures / Treatments   Labs (all labs ordered are listed, but only abnormal results are displayed) Labs Reviewed  BASIC METABOLIC PANEL - Abnormal; Notable for the following components:      Result Value   CO2 21 (*)    BUN 24 (*)    Calcium 8.5 (*)    All other components within normal limits  CBC - Abnormal; Notable for the following components:   RBC 3.05 (*)    Hemoglobin 9.9 (*)  HCT 29.6 (*)    All other components within normal limits    EKG None  Radiology CT Cervical Spine Wo Contrast  Result Date: 11/02/2022 CLINICAL DATA:  Neck trauma. EXAM: CT CERVICAL SPINE WITHOUT CONTRAST TECHNIQUE: Multidetector CT imaging of the cervical spine was performed without intravenous contrast. Multiplanar CT image reconstructions were also generated. RADIATION DOSE REDUCTION: This exam was performed according to the departmental dose-optimization program which includes automated exposure control, adjustment of the mA and/or kV according to patient size and/or use of iterative reconstruction technique. COMPARISON:  10/24/2022 FINDINGS: Alignment: Exaggerated cervical lordosis. No posttraumatic subluxation. Mild  curvature convex right. Skull base and vertebrae: Vertebral body heights are maintained. There is moderate spondylosis throughout the cervical spine to include facet arthropathy and uncovertebral joint spurring. No significant neural foraminal narrowing. No acute fracture. Atlantoaxial articulation is within normal. Soft tissues and spinal canal: No prevertebral fluid or swelling. No visible canal hematoma. Disc levels: Disc space narrowing at the C2-3, C4-5 and C5-6 levels. Upper chest: No acute findings.  Biapical pleural thickening. Other: None. IMPRESSION: 1. No acute cervical spine injury. 2. Moderate spondylosis throughout the cervical spine with disc disease at the C2-3, C4-5 and C5-6 levels. Electronically Signed   By: Elberta Fortis M.D.   On: 11/02/2022 12:59   CT Head Wo Contrast  Result Date: 11/02/2022 CLINICAL DATA:  Head trauma, minor (Age >= 65y) posterior head injury on a/c EXAM: CT HEAD WITHOUT CONTRAST TECHNIQUE: Contiguous axial images were obtained from the base of the skull through the vertex without intravenous contrast. RADIATION DOSE REDUCTION: This exam was performed according to the departmental dose-optimization program which includes automated exposure control, adjustment of the mA and/or kV according to patient size and/or use of iterative reconstruction technique. COMPARISON:  None Available. FINDINGS: Brain: No evidence of acute infarction, hemorrhage, hydrocephalus, extra-axial collection or mass lesion/mass effect. Vascular: No hyperdense vessel. Skull: No acute fracture. Sinuses/Orbits: No acute finding. IMPRESSION: No evidence of acute intracranial abnormality. Electronically Signed   By: Feliberto Harts M.D.   On: 11/02/2022 08:30    Procedures .Marland KitchenLaceration Repair  Date/Time: 11/02/2022 10:59 AM  Performed by: Terald Sleeper, MD Authorized by: Terald Sleeper, MD   Consent:    Consent obtained:  Verbal   Consent given by:  Patient and guardian   Risks,  benefits, and alternatives were discussed: yes     Risks discussed:  Infection and pain Universal protocol:    Procedure explained and questions answered to patient or proxy's satisfaction: yes     Site/side marked: yes     Immediately prior to procedure, a time out was called: yes     Patient identity confirmed:  Arm band Anesthesia:    Anesthesia method:  Topical application   Topical anesthetic:  LET Laceration details:    Location:  Scalp   Scalp location:  Occipital   Length (cm):  1   Depth (mm):  2 Pre-procedure details:    Preparation:  Patient was prepped and draped in usual sterile fashion and imaging obtained to evaluate for foreign bodies Exploration:    Hemostasis achieved with:  Tied off vessels and direct pressure   Imaging outcome: foreign body not noted     Wound exploration: wound explored through full range of motion     Contaminated: no   Treatment:    Area cleansed with:  Saline   Amount of cleaning:  Standard   Irrigation solution:  Sterile saline Skin repair:  Repair method:  Sutures   Suture size:  2-0 and 3-0   Suture material:  Prolene   Suture technique:  Simple interrupted and figure eight   Number of sutures:  2 Approximation:    Approximation:  Close Repair type:    Repair type:  Intermediate Post-procedure details:    Dressing:  Adhesive bandage   Procedure completion:  Tolerated well, no immediate complications     Medications Ordered in ED Medications  fentaNYL (SUBLIMAZE) injection 25 mcg (25 mcg Intravenous Given 11/02/22 0931)    ED Course/ Medical Decision Making/ A&P Clinical Course as of 11/03/22 1415  Mon Nov 02, 2022  0454 Workup is largely.  There is about a 1 unit drop in her hemoglobin, which is consistent with a small arterial bleed on the top of her scalp.  Although is not actively bleeding earlier, when I scrubbed there had the arterial bleed began pulsatile again.  We were able to hold pressure to it and then placed 2  sutures through the proximal bleed, gaining control of the bleed at this time.  A small dose of fentanyl was given for pain.  Patient's daughter is present at bedside [MT]  1013 Bleeding remains hemostatic [MT]  1135 Patient was repeatedly noting to be holding her neck on my exam and the nurses exam.  I repeatedly asked her if her neck is hurting he said no.  But she does feel like she is having difficulty moving her neck around.  This may be postural as she has been sitting stiff due to her head bleed for the past several hours, but I think is reasonable to obtain a CT of the C-spine as well. [MT]  1302 C-spine imaging is unremarkable.  C-spine collar cleared.  I suspect her neck pain is most likely muscular related to strain of holding her head still, for both stitches and for head injury. [MT]    Clinical Course User Index [MT] Korin Hartwell, Kermit Balo, MD                             Medical Decision Making Amount and/or Complexity of Data Reviewed Labs: ordered. Radiology: ordered.  Risk Prescription drug management.   This patient presents to the ED with concern for fall, head bleed, head injury. This involves an extensive number of treatment options, and is a complaint that carries with it a high risk of complications and morbidity.  The differential diagnosis includes ICH vs scalp hematoma vs other  Co-morbidities that complicate the patient evaluation: A/c use (eliquis) for PE at higher bleeding risk  Additional history obtained from EMS, patient's daughter  External records from outside source obtained and reviewed including discharge summary Jan 2024  I ordered and personally interpreted labs.  The pertinent results include:  : hgb 9.9, BMP unremarkable  I ordered imaging studies including CT head I independently visualized and interpreted imaging which showed no acute intracranial bleed I agree with the radiologist interpretation  The patient was maintained on a cardiac monitor.     I ordered medication including fentanyl for pain during suturing  I have reviewed the patients home medicines and have made adjustments as needed.  At this time I would have him hold her next Eliquis dose tonight and then resume tomorrow.  Test Considered: Other traumatic injuries noted on exam to warrant further imaging at this time  After the interventions noted above, I reevaluated the patient and found that  they have: improved   Dispostion:  After consideration of the diagnostic results and the patients response to treatment, I feel that the patent would benefit from follow-up with SNF provider.  I would recommend that they wait another 3 days to remove her sutures, which are due to be removed tomorrow.  She will also need her stitches removed in 7 days.         Final Clinical Impression(s) / ED Diagnoses Final diagnoses:  Laceration of scalp without foreign body, initial encounter    Rx / DC Orders ED Discharge Orders     None         Deonte Otting, Kermit Balo, MD 11/03/22 1415

## 2022-11-02 NOTE — Discharge Instructions (Addendum)
Marija had a CT scan of the head today which fortunately did not show signs of brain bleeding.  She had a small arterial bleed in the top of her head that was sutured shut with 2 sutures.  These 2 blue stitches should be removed in 7 days on 11/08/22.  Her scalp staples can be removed in 3 days (11/05/22).  Because the staples are very close to the site where she had her stitches placed, I would leave them in for these additional 3 days.  I would also recommend holding her dose of Eliquis tonight.  She can resume Eliquis tomorrow morning.  *  She should keep her scalp clean and dry today with a nonadhesive bandage over the bleeding site.  Beginning tomorrow she can shower normally.  If she begins bleeding again, please hold pressure with a single digit directly over the site of the bleed.  This is likely due to a very small artery that is bleeding, and direct pressure on the artery would be much more beneficial than wrapping her head with bandages, which does not control the bleeding.  You should still call EMS or 911 if her bleeding is uncontrolled.  *  Please note that Ashtynn was also having some neck pain.  We ordered a CT scan of her cervical spine which did not show a fracture.  I suspect she may have a muscle strain from her head injury and also from the strain of putting stitches in her head.

## 2022-11-03 DIAGNOSIS — H409 Unspecified glaucoma: Secondary | ICD-10-CM | POA: Diagnosis not present

## 2022-11-03 DIAGNOSIS — J189 Pneumonia, unspecified organism: Secondary | ICD-10-CM | POA: Diagnosis not present

## 2022-11-03 DIAGNOSIS — R636 Underweight: Secondary | ICD-10-CM | POA: Diagnosis not present

## 2022-11-03 DIAGNOSIS — I2699 Other pulmonary embolism without acute cor pulmonale: Secondary | ICD-10-CM | POA: Diagnosis not present

## 2022-11-03 DIAGNOSIS — R4189 Other symptoms and signs involving cognitive functions and awareness: Secondary | ICD-10-CM | POA: Diagnosis not present

## 2022-11-03 DIAGNOSIS — W19XXXD Unspecified fall, subsequent encounter: Secondary | ICD-10-CM | POA: Diagnosis not present

## 2022-11-03 DIAGNOSIS — I82401 Acute embolism and thrombosis of unspecified deep veins of right lower extremity: Secondary | ICD-10-CM | POA: Diagnosis not present

## 2022-11-03 DIAGNOSIS — I10 Essential (primary) hypertension: Secondary | ICD-10-CM | POA: Diagnosis not present

## 2022-11-05 DIAGNOSIS — I10 Essential (primary) hypertension: Secondary | ICD-10-CM | POA: Diagnosis not present

## 2022-11-05 DIAGNOSIS — I82401 Acute embolism and thrombosis of unspecified deep veins of right lower extremity: Secondary | ICD-10-CM | POA: Diagnosis not present

## 2022-11-05 DIAGNOSIS — I2699 Other pulmonary embolism without acute cor pulmonale: Secondary | ICD-10-CM | POA: Diagnosis not present

## 2022-11-05 DIAGNOSIS — R4189 Other symptoms and signs involving cognitive functions and awareness: Secondary | ICD-10-CM | POA: Diagnosis not present

## 2022-11-06 DIAGNOSIS — W19XXXD Unspecified fall, subsequent encounter: Secondary | ICD-10-CM | POA: Diagnosis not present

## 2022-11-06 DIAGNOSIS — R4189 Other symptoms and signs involving cognitive functions and awareness: Secondary | ICD-10-CM | POA: Diagnosis not present

## 2022-11-06 DIAGNOSIS — I2699 Other pulmonary embolism without acute cor pulmonale: Secondary | ICD-10-CM | POA: Diagnosis not present

## 2022-11-06 DIAGNOSIS — I82401 Acute embolism and thrombosis of unspecified deep veins of right lower extremity: Secondary | ICD-10-CM | POA: Diagnosis not present

## 2022-11-06 DIAGNOSIS — I10 Essential (primary) hypertension: Secondary | ICD-10-CM | POA: Diagnosis not present

## 2022-11-09 DIAGNOSIS — R4189 Other symptoms and signs involving cognitive functions and awareness: Secondary | ICD-10-CM | POA: Diagnosis not present

## 2022-11-09 DIAGNOSIS — I10 Essential (primary) hypertension: Secondary | ICD-10-CM | POA: Diagnosis not present

## 2022-11-09 DIAGNOSIS — M542 Cervicalgia: Secondary | ICD-10-CM | POA: Diagnosis not present

## 2022-11-09 DIAGNOSIS — I2699 Other pulmonary embolism without acute cor pulmonale: Secondary | ICD-10-CM | POA: Diagnosis not present

## 2022-11-09 DIAGNOSIS — I82401 Acute embolism and thrombosis of unspecified deep veins of right lower extremity: Secondary | ICD-10-CM | POA: Diagnosis not present

## 2022-11-11 DIAGNOSIS — I10 Essential (primary) hypertension: Secondary | ICD-10-CM | POA: Diagnosis not present

## 2022-11-11 DIAGNOSIS — R4189 Other symptoms and signs involving cognitive functions and awareness: Secondary | ICD-10-CM | POA: Diagnosis not present

## 2022-11-11 DIAGNOSIS — I2699 Other pulmonary embolism without acute cor pulmonale: Secondary | ICD-10-CM | POA: Diagnosis not present

## 2022-11-11 DIAGNOSIS — I82401 Acute embolism and thrombosis of unspecified deep veins of right lower extremity: Secondary | ICD-10-CM | POA: Diagnosis not present

## 2022-11-16 NOTE — Telephone Encounter (Signed)
[facility] home number for patient- phone rang and rang with no answer or voicemail. Left voicemail with her son, Simona Huh to give [place] a call back. No DPR is on file.

## 2022-11-17 DIAGNOSIS — I10 Essential (primary) hypertension: Secondary | ICD-10-CM | POA: Diagnosis not present

## 2022-11-17 DIAGNOSIS — R4189 Other symptoms and signs involving cognitive functions and awareness: Secondary | ICD-10-CM | POA: Diagnosis not present

## 2022-11-17 DIAGNOSIS — I2699 Other pulmonary embolism without acute cor pulmonale: Secondary | ICD-10-CM | POA: Diagnosis not present

## 2022-11-17 DIAGNOSIS — I82401 Acute embolism and thrombosis of unspecified deep veins of right lower extremity: Secondary | ICD-10-CM | POA: Diagnosis not present

## 2022-11-19 DIAGNOSIS — I82409 Acute embolism and thrombosis of unspecified deep veins of unspecified lower extremity: Secondary | ICD-10-CM | POA: Diagnosis not present

## 2022-11-19 DIAGNOSIS — M6281 Muscle weakness (generalized): Secondary | ICD-10-CM | POA: Diagnosis not present

## 2022-11-19 DIAGNOSIS — R278 Other lack of coordination: Secondary | ICD-10-CM | POA: Diagnosis not present

## 2022-11-19 DIAGNOSIS — I2699 Other pulmonary embolism without acute cor pulmonale: Secondary | ICD-10-CM | POA: Diagnosis not present

## 2022-11-19 DIAGNOSIS — S0101XA Laceration without foreign body of scalp, initial encounter: Secondary | ICD-10-CM | POA: Diagnosis not present

## 2022-11-19 DIAGNOSIS — J188 Other pneumonia, unspecified organism: Secondary | ICD-10-CM | POA: Diagnosis not present

## 2022-11-19 DIAGNOSIS — R2681 Unsteadiness on feet: Secondary | ICD-10-CM | POA: Diagnosis not present

## 2022-11-19 DIAGNOSIS — Z9181 History of falling: Secondary | ICD-10-CM | POA: Diagnosis not present

## 2022-11-19 DIAGNOSIS — R293 Abnormal posture: Secondary | ICD-10-CM | POA: Diagnosis not present

## 2022-11-23 NOTE — Telephone Encounter (Signed)
Patient was scheduled by front staff.

## 2022-11-27 ENCOUNTER — Inpatient Hospital Stay: Payer: Medicare PPO | Admitting: Nurse Practitioner

## 2022-11-30 DIAGNOSIS — R4189 Other symptoms and signs involving cognitive functions and awareness: Secondary | ICD-10-CM | POA: Diagnosis not present

## 2022-11-30 DIAGNOSIS — I82401 Acute embolism and thrombosis of unspecified deep veins of right lower extremity: Secondary | ICD-10-CM | POA: Diagnosis not present

## 2022-11-30 DIAGNOSIS — U071 COVID-19: Secondary | ICD-10-CM | POA: Diagnosis not present

## 2022-11-30 DIAGNOSIS — R600 Localized edema: Secondary | ICD-10-CM | POA: Diagnosis not present

## 2022-11-30 DIAGNOSIS — I10 Essential (primary) hypertension: Secondary | ICD-10-CM | POA: Diagnosis not present

## 2022-12-01 DIAGNOSIS — I5020 Unspecified systolic (congestive) heart failure: Secondary | ICD-10-CM | POA: Diagnosis not present

## 2022-12-01 DIAGNOSIS — R278 Other lack of coordination: Secondary | ICD-10-CM | POA: Diagnosis not present

## 2022-12-01 DIAGNOSIS — R293 Abnormal posture: Secondary | ICD-10-CM | POA: Diagnosis not present

## 2022-12-01 DIAGNOSIS — Z9181 History of falling: Secondary | ICD-10-CM | POA: Diagnosis not present

## 2022-12-01 DIAGNOSIS — I82409 Acute embolism and thrombosis of unspecified deep veins of unspecified lower extremity: Secondary | ICD-10-CM | POA: Diagnosis not present

## 2022-12-01 DIAGNOSIS — I2699 Other pulmonary embolism without acute cor pulmonale: Secondary | ICD-10-CM | POA: Diagnosis not present

## 2022-12-01 DIAGNOSIS — S0101XA Laceration without foreign body of scalp, initial encounter: Secondary | ICD-10-CM | POA: Diagnosis not present

## 2022-12-01 DIAGNOSIS — M6281 Muscle weakness (generalized): Secondary | ICD-10-CM | POA: Diagnosis not present

## 2022-12-01 DIAGNOSIS — R2681 Unsteadiness on feet: Secondary | ICD-10-CM | POA: Diagnosis not present

## 2022-12-01 DIAGNOSIS — J188 Other pneumonia, unspecified organism: Secondary | ICD-10-CM | POA: Diagnosis not present

## 2022-12-02 DIAGNOSIS — Z9181 History of falling: Secondary | ICD-10-CM | POA: Diagnosis not present

## 2022-12-02 DIAGNOSIS — R600 Localized edema: Secondary | ICD-10-CM | POA: Diagnosis not present

## 2022-12-02 DIAGNOSIS — M6281 Muscle weakness (generalized): Secondary | ICD-10-CM | POA: Diagnosis not present

## 2022-12-02 DIAGNOSIS — I82401 Acute embolism and thrombosis of unspecified deep veins of right lower extremity: Secondary | ICD-10-CM | POA: Diagnosis not present

## 2022-12-02 DIAGNOSIS — R4189 Other symptoms and signs involving cognitive functions and awareness: Secondary | ICD-10-CM | POA: Diagnosis not present

## 2022-12-02 DIAGNOSIS — R293 Abnormal posture: Secondary | ICD-10-CM | POA: Diagnosis not present

## 2022-12-02 DIAGNOSIS — I2699 Other pulmonary embolism without acute cor pulmonale: Secondary | ICD-10-CM | POA: Diagnosis not present

## 2022-12-02 DIAGNOSIS — R2681 Unsteadiness on feet: Secondary | ICD-10-CM | POA: Diagnosis not present

## 2022-12-02 DIAGNOSIS — S0101XA Laceration without foreign body of scalp, initial encounter: Secondary | ICD-10-CM | POA: Diagnosis not present

## 2022-12-02 DIAGNOSIS — R278 Other lack of coordination: Secondary | ICD-10-CM | POA: Diagnosis not present

## 2022-12-02 DIAGNOSIS — I82409 Acute embolism and thrombosis of unspecified deep veins of unspecified lower extremity: Secondary | ICD-10-CM | POA: Diagnosis not present

## 2022-12-02 DIAGNOSIS — I10 Essential (primary) hypertension: Secondary | ICD-10-CM | POA: Diagnosis not present

## 2022-12-02 DIAGNOSIS — J188 Other pneumonia, unspecified organism: Secondary | ICD-10-CM | POA: Diagnosis not present

## 2022-12-03 DIAGNOSIS — I2699 Other pulmonary embolism without acute cor pulmonale: Secondary | ICD-10-CM | POA: Diagnosis not present

## 2022-12-03 DIAGNOSIS — S0101XA Laceration without foreign body of scalp, initial encounter: Secondary | ICD-10-CM | POA: Diagnosis not present

## 2022-12-03 DIAGNOSIS — R293 Abnormal posture: Secondary | ICD-10-CM | POA: Diagnosis not present

## 2022-12-03 DIAGNOSIS — R278 Other lack of coordination: Secondary | ICD-10-CM | POA: Diagnosis not present

## 2022-12-03 DIAGNOSIS — R2681 Unsteadiness on feet: Secondary | ICD-10-CM | POA: Diagnosis not present

## 2022-12-03 DIAGNOSIS — M6281 Muscle weakness (generalized): Secondary | ICD-10-CM | POA: Diagnosis not present

## 2022-12-03 DIAGNOSIS — J188 Other pneumonia, unspecified organism: Secondary | ICD-10-CM | POA: Diagnosis not present

## 2022-12-03 DIAGNOSIS — Z9181 History of falling: Secondary | ICD-10-CM | POA: Diagnosis not present

## 2022-12-03 DIAGNOSIS — I82409 Acute embolism and thrombosis of unspecified deep veins of unspecified lower extremity: Secondary | ICD-10-CM | POA: Diagnosis not present

## 2022-12-04 DIAGNOSIS — Z9181 History of falling: Secondary | ICD-10-CM | POA: Diagnosis not present

## 2022-12-04 DIAGNOSIS — S0101XA Laceration without foreign body of scalp, initial encounter: Secondary | ICD-10-CM | POA: Diagnosis not present

## 2022-12-04 DIAGNOSIS — J188 Other pneumonia, unspecified organism: Secondary | ICD-10-CM | POA: Diagnosis not present

## 2022-12-04 DIAGNOSIS — I82409 Acute embolism and thrombosis of unspecified deep veins of unspecified lower extremity: Secondary | ICD-10-CM | POA: Diagnosis not present

## 2022-12-04 DIAGNOSIS — R278 Other lack of coordination: Secondary | ICD-10-CM | POA: Diagnosis not present

## 2022-12-04 DIAGNOSIS — I2699 Other pulmonary embolism without acute cor pulmonale: Secondary | ICD-10-CM | POA: Diagnosis not present

## 2022-12-04 DIAGNOSIS — R600 Localized edema: Secondary | ICD-10-CM | POA: Diagnosis not present

## 2022-12-04 DIAGNOSIS — E440 Moderate protein-calorie malnutrition: Secondary | ICD-10-CM | POA: Diagnosis not present

## 2022-12-04 DIAGNOSIS — D6869 Other thrombophilia: Secondary | ICD-10-CM | POA: Diagnosis not present

## 2022-12-04 DIAGNOSIS — R2681 Unsteadiness on feet: Secondary | ICD-10-CM | POA: Diagnosis not present

## 2022-12-04 DIAGNOSIS — R293 Abnormal posture: Secondary | ICD-10-CM | POA: Diagnosis not present

## 2022-12-04 DIAGNOSIS — M6281 Muscle weakness (generalized): Secondary | ICD-10-CM | POA: Diagnosis not present

## 2022-12-04 DIAGNOSIS — F3341 Major depressive disorder, recurrent, in partial remission: Secondary | ICD-10-CM | POA: Diagnosis not present

## 2022-12-07 ENCOUNTER — Ambulatory Visit
Admission: RE | Admit: 2022-12-07 | Discharge: 2022-12-07 | Disposition: A | Discharge status: Home / Self Care | Payer: Medicare PPO | Source: Ambulatory Visit | Attending: Student | Admitting: Student

## 2022-12-07 DIAGNOSIS — M6281 Muscle weakness (generalized): Secondary | ICD-10-CM | POA: Diagnosis not present

## 2022-12-07 DIAGNOSIS — J479 Bronchiectasis, uncomplicated: Secondary | ICD-10-CM | POA: Diagnosis not present

## 2022-12-07 DIAGNOSIS — Z9181 History of falling: Secondary | ICD-10-CM | POA: Diagnosis not present

## 2022-12-07 DIAGNOSIS — R2681 Unsteadiness on feet: Secondary | ICD-10-CM | POA: Diagnosis not present

## 2022-12-07 DIAGNOSIS — R278 Other lack of coordination: Secondary | ICD-10-CM | POA: Diagnosis not present

## 2022-12-07 DIAGNOSIS — R911 Solitary pulmonary nodule: Secondary | ICD-10-CM | POA: Diagnosis not present

## 2022-12-07 DIAGNOSIS — S0101XA Laceration without foreign body of scalp, initial encounter: Secondary | ICD-10-CM | POA: Diagnosis not present

## 2022-12-07 DIAGNOSIS — I82409 Acute embolism and thrombosis of unspecified deep veins of unspecified lower extremity: Secondary | ICD-10-CM | POA: Diagnosis not present

## 2022-12-07 DIAGNOSIS — I2699 Other pulmonary embolism without acute cor pulmonale: Secondary | ICD-10-CM | POA: Diagnosis not present

## 2022-12-07 DIAGNOSIS — J188 Other pneumonia, unspecified organism: Secondary | ICD-10-CM | POA: Diagnosis not present

## 2022-12-07 DIAGNOSIS — R918 Other nonspecific abnormal finding of lung field: Secondary | ICD-10-CM

## 2022-12-07 DIAGNOSIS — R293 Abnormal posture: Secondary | ICD-10-CM | POA: Diagnosis not present

## 2022-12-08 DIAGNOSIS — S0101XA Laceration without foreign body of scalp, initial encounter: Secondary | ICD-10-CM | POA: Diagnosis not present

## 2022-12-08 DIAGNOSIS — I82409 Acute embolism and thrombosis of unspecified deep veins of unspecified lower extremity: Secondary | ICD-10-CM | POA: Diagnosis not present

## 2022-12-08 DIAGNOSIS — R293 Abnormal posture: Secondary | ICD-10-CM | POA: Diagnosis not present

## 2022-12-08 DIAGNOSIS — I2699 Other pulmonary embolism without acute cor pulmonale: Secondary | ICD-10-CM | POA: Diagnosis not present

## 2022-12-08 DIAGNOSIS — R278 Other lack of coordination: Secondary | ICD-10-CM | POA: Diagnosis not present

## 2022-12-08 DIAGNOSIS — Z9181 History of falling: Secondary | ICD-10-CM | POA: Diagnosis not present

## 2022-12-08 DIAGNOSIS — R2681 Unsteadiness on feet: Secondary | ICD-10-CM | POA: Diagnosis not present

## 2022-12-08 DIAGNOSIS — J188 Other pneumonia, unspecified organism: Secondary | ICD-10-CM | POA: Diagnosis not present

## 2022-12-08 DIAGNOSIS — M6281 Muscle weakness (generalized): Secondary | ICD-10-CM | POA: Diagnosis not present

## 2022-12-09 DIAGNOSIS — R293 Abnormal posture: Secondary | ICD-10-CM | POA: Diagnosis not present

## 2022-12-09 DIAGNOSIS — I82409 Acute embolism and thrombosis of unspecified deep veins of unspecified lower extremity: Secondary | ICD-10-CM | POA: Diagnosis not present

## 2022-12-09 DIAGNOSIS — Z9181 History of falling: Secondary | ICD-10-CM | POA: Diagnosis not present

## 2022-12-09 DIAGNOSIS — J188 Other pneumonia, unspecified organism: Secondary | ICD-10-CM | POA: Diagnosis not present

## 2022-12-09 DIAGNOSIS — S0101XA Laceration without foreign body of scalp, initial encounter: Secondary | ICD-10-CM | POA: Diagnosis not present

## 2022-12-09 DIAGNOSIS — R278 Other lack of coordination: Secondary | ICD-10-CM | POA: Diagnosis not present

## 2022-12-09 DIAGNOSIS — R2681 Unsteadiness on feet: Secondary | ICD-10-CM | POA: Diagnosis not present

## 2022-12-09 DIAGNOSIS — I2699 Other pulmonary embolism without acute cor pulmonale: Secondary | ICD-10-CM | POA: Diagnosis not present

## 2022-12-09 DIAGNOSIS — M6281 Muscle weakness (generalized): Secondary | ICD-10-CM | POA: Diagnosis not present

## 2022-12-10 DIAGNOSIS — R2681 Unsteadiness on feet: Secondary | ICD-10-CM | POA: Diagnosis not present

## 2022-12-10 DIAGNOSIS — S0101XA Laceration without foreign body of scalp, initial encounter: Secondary | ICD-10-CM | POA: Diagnosis not present

## 2022-12-10 DIAGNOSIS — I2699 Other pulmonary embolism without acute cor pulmonale: Secondary | ICD-10-CM | POA: Diagnosis not present

## 2022-12-10 DIAGNOSIS — M6281 Muscle weakness (generalized): Secondary | ICD-10-CM | POA: Diagnosis not present

## 2022-12-10 DIAGNOSIS — R278 Other lack of coordination: Secondary | ICD-10-CM | POA: Diagnosis not present

## 2022-12-10 DIAGNOSIS — R293 Abnormal posture: Secondary | ICD-10-CM | POA: Diagnosis not present

## 2022-12-10 DIAGNOSIS — J188 Other pneumonia, unspecified organism: Secondary | ICD-10-CM | POA: Diagnosis not present

## 2022-12-10 DIAGNOSIS — Z9181 History of falling: Secondary | ICD-10-CM | POA: Diagnosis not present

## 2022-12-10 DIAGNOSIS — I82409 Acute embolism and thrombosis of unspecified deep veins of unspecified lower extremity: Secondary | ICD-10-CM | POA: Diagnosis not present

## 2022-12-11 ENCOUNTER — Encounter: Payer: Self-pay | Admitting: Nurse Practitioner

## 2022-12-11 ENCOUNTER — Ambulatory Visit: Payer: Medicare PPO | Admitting: Nurse Practitioner

## 2022-12-11 VITALS — BP 126/66 | HR 73 | Temp 97.9°F | Ht 61.0 in | Wt 96.2 lb

## 2022-12-11 DIAGNOSIS — I2699 Other pulmonary embolism without acute cor pulmonale: Secondary | ICD-10-CM | POA: Diagnosis not present

## 2022-12-11 DIAGNOSIS — J479 Bronchiectasis, uncomplicated: Secondary | ICD-10-CM | POA: Diagnosis not present

## 2022-12-11 DIAGNOSIS — R600 Localized edema: Secondary | ICD-10-CM | POA: Diagnosis not present

## 2022-12-11 DIAGNOSIS — I824Y9 Acute embolism and thrombosis of unspecified deep veins of unspecified proximal lower extremity: Secondary | ICD-10-CM

## 2022-12-11 DIAGNOSIS — J189 Pneumonia, unspecified organism: Secondary | ICD-10-CM | POA: Diagnosis not present

## 2022-12-11 LAB — BRAIN NATRIURETIC PEPTIDE: Pro B Natriuretic peptide (BNP): 43 pg/mL (ref 0.0–100.0)

## 2022-12-11 MED ORDER — SODIUM CHLORIDE 3 % IN NEBU: INHALATION_SOLUTION | RESPIRATORY_TRACT | 2 refills | Status: DC | PRN

## 2022-12-11 NOTE — Assessment & Plan Note (Signed)
Acute PE without right heart strain. Likely provoked in the setting of acute infection. She is doing well on Eliquis twice daily. She will need a minimum of 3-6 months of therapy. Reviewed red flag symptoms with anticoagulation therapy.

## 2022-12-11 NOTE — Assessment & Plan Note (Signed)
DVT in [facility] on venous US. They did not complete full exam of the left leg but possible BLE edema is related to DVTs. Given she has had some increased swelling and weight gain, we will double check her BNP to ensure it is not elevated. Otherwise, encouraged her to continue wrapping her legs and utilize diuretic therapy. I also advised her to elevate her legs while sitting. See above regarding anticoagulation therapy.

## 2022-12-11 NOTE — Patient Instructions (Addendum)
Continue Eliquis 5 mg Twice daily. Monitor for any excessive bruising or bleeding. If you fall, ever hit your head or develop severe sudden headaches, you need to go to the emergency department for evaluation.   Elevate your feet when you are sitting. Ask the facility for an egg crate cushion to sit on   I have sent orders for a nebulizer and the medications just in case you have trouble with cough and chest congestion Hypertonic saline nebs 3 mL as needed for chest congestion/increased cough Guaifenesin 600 mg Twice daily as needed for chest congestion/cough  Use flutter valve as needed for chest congestion/cough  Labs today - BNP  Follow up in 6 weeks with Dr. Verlee Monte to determine timing of next scan. If symptoms do not improve or worsen, please contact office for sooner follow up or seek emergency care.

## 2022-12-11 NOTE — Progress Notes (Signed)
$'[name]$  ID: Julia Hampton, female    DOB: September 11, 1930, 89 y.o.   MRN: [id number]  Chief Complaint  Patient presents with   Hospitalization Follow-up    Pt has been out of the hospital 5 weeks and is in [facility]. Pt has had 4 falls in total since leaving hospital and found out she has a pulmonary embolism.     Referring provider: Kristen Loader, FNP  HPI: 89 year old female, never smoker followed for multifocal pneumonia and acute pulmonary embolism.   She was admitted 10/24/2022-10/30/2022 after a fall at home. She was found to have acute PE, RLE DVT, and multifocal pneumonia. She was treated with 5 days of antibiotics, heparin gtt and transitioned to PO Eliquis. She was discharged to rehab facility with plans to follow up with pulmonary outpatient.   TEST/EVENTS:  10/24/2022 CTA chest: Pulmonary embolism within segmental and left lower lobar pulmonary arteries, nonocclusive.  Clot also seen within the segmental branches of the right upper lobe, possibly subacute given the somewhat peripheral appearance.  Insufficient visible clot burden for right heart strain.  No adenopathy.  Reticular nodular opacities in the lingula and lower lobes with areas of mucoid airway impaction.  Mild cylindrical bronchiectasis in the lingula.  Nodules are mainly clustered in inflammatory pattern, measuring up to 1 mm in the right lower lobe.  Area of confluent consolidation in the subpleural left lower lobe.  Biapical pleural based scarring.  Suspect combination of chronic lung disease and acute pneumonia. 12/07/2022 CT chest without contrast: Atherosclerosis and CAD.  Stable borderline mediastinal and hilar lymph nodes, likely reactive.  Biapical pleural-parenchymal scarring changes.  Persistent areas of tree-in-bud changes along the peribronchial thickening and airspace nodularity most notably in both lower lobes.  Findings most consistent with atypical infection such as MAC.  New subpleural airspace  opacity in the left lower lobe laterally, could be acute infiltrate versus rounded atelectasis.  No pulmonary edema.  Mild areas of bronchiectasis.  Small to moderate size hiatal hernia  12/11/2022: Today - hospital follow up Patient presents today for hospital follow up with her daughter and daughter in law. She tells me that she has been doing much better since her hospital admission. She feels like she's back to her baseline, if not better. She denies any shortness of breath, wheezing, chest congestion, or cough. She has never been diagnosed with a pulmonary condition before. She does seem to get recurrent pneumonias. She is still having some trouble with swelling in her lower legs. Initially was the right leg but now also in the left leg. Her pcp has started her on low dose diuretic but she's not taking it consistently due to the rehab facility she's in. They are also wrapping her legs. She has not been elevating them consistently during the day. Denies any palpitations, chest discomfort, orthopnea. Her previous echo was nl. She did have a repeat CT chest Monday this week which showed a new lower lobe opacity and chronic tree in bud nodularity. They tell me that she had COVID two weeks ago. Recovered quickly from this. No fevers, chills, hemoptysis recently. Eating and drinking very well. She 's actually up about 13 pounds.   Allergies  Allergen Reactions   Other Other (See Comments)    Glaucoma drop with a dark green top - not sure of name -    There is no immunization history for the selected administration types on file for this patient.  Past Medical History:  Diagnosis Date  Complication of anesthesia    trouble waking patient up   Glaucoma     Tobacco History: Social History   Tobacco Use  Smoking Status Never  Smokeless Tobacco Never   Counseling given: Not Answered   Outpatient Medications Prior to Visit  Medication Sig Dispense Refill   acetaminophen (TYLENOL) 500 MG  tablet Take 500 mg by mouth every 6 (six) hours as needed for mild pain or moderate pain.     amLODipine (NORVASC) 5 MG tablet Take 5 mg by mouth daily.     apixaban (ELIQUIS) 5 MG TABS tablet Take 1 tablet (5 mg total) by mouth 2 (two) times daily. 60 tablet    bismuth subsalicylate (PEPTO-BISMOL) 262 MG/15ML suspension Take 30 mLs by mouth every 6 (six) hours as needed. (Patient taking differently: Take 30 mLs by mouth every 6 (six) hours as needed for indigestion.) 360 mL 0   brimonidine (ALPHAGAN) 0.2 % ophthalmic solution Place 1 drop into the right eye 2 (two) times daily.     citalopram (CELEXA) 20 MG tablet Take 20 mg by mouth daily.     dorzolamide-timolol (COSOPT) 2-0.5 % ophthalmic solution Place 1 drop into both eyes 2 (two) times daily.     feeding supplement, ENSURE ENLIVE, (ENSURE ENLIVE) LIQD Take 237 mLs by mouth 3 (three) times daily between meals. 237 mL 12   latanoprost (XALATAN) 0.005 % ophthalmic solution Place 1 drop into the right eye at bedtime.     mirtazapine (REMERON) 7.5 MG tablet Take 1 tablet (7.5 mg total) by mouth at bedtime.     pilocarpine (PILOCAR) 4 % ophthalmic solution Place 1 drop into the right eye 2 (two) times daily.     apixaban (ELIQUIS) 5 MG TABS tablet Take 2 tablets (10 mg total) by mouth 2 (two) times daily for 3 days. 60 tablet    No facility-administered medications prior to visit.     Review of Systems:   Constitutional: No weight loss or gain, night sweats, fevers, chills, fatigue, or lassitude. HEENT: No headaches, difficulty swallowing, tooth/dental problems, or sore throat. No sneezing, itching, ear ache, nasal congestion, or post nasal drip CV:  +swelling in lower extremities. No chest pain, orthopnea, PND, anasarca, dizziness, palpitations, syncope Resp: No shortness of breath with exertion or at rest. No excess mucus or change in color of mucus. No productive or non-productive. No hemoptysis. No wheezing.  No chest wall deformity GI:   +occasional heartburn, indigestion. No abdominal pain, nausea, vomiting, diarrhea, change in bowel habits, loss of appetite, bloody stools.  GU: No dysuria, change in color of urine, urgency or frequency. Skin: No rash, lesions, ulcerations MSK:  No joint pain or swelling.   Neuro: No dizziness or lightheadedness.  Psych: No depression or anxiety. Mood stable.     Physical Exam:  BP 126/66 (BP Location: Right Arm, Patient Position: Sitting, Cuff Size: Normal)   Pulse 73   Temp 97.9 F (36.6 C) (Oral)   Ht '5\' 1"'$  (1.549 m)   Wt 96 lb 3.2 oz (43.6 kg)   SpO2 93%   BMI 18.18 kg/m   GEN: Pleasant, interactive, well-kempt; elderly, frail; in no acute distress. HEENT:  Normocephalic and atraumatic. PERRLA. Sclera white. Nasal turbinates pink, moist and patent bilaterally. No rhinorrhea present. Oropharynx pink and moist, without exudate or edema. No lesions, ulcerations, or postnasal drip.  NECK:  Supple w/ fair ROM. No JVD present. Normal carotid impulses w/o bruits. Thyroid symmetrical with no goiter or nodules palpated. No  lymphadenopathy.   CV: RRR, no m/r/g, BLE dependent edema. Pulses intact, +2 bilaterally. No cyanosis, pallor or clubbing. PULMONARY:  Unlabored, regular breathing. Clear bilaterally A&P w/o wheezes/rales/rhonchi. No accessory muscle use.  GI: BS present and normoactive. Soft, non-tender to palpation. No organomegaly or masses detected.  MSK: No erythema, warmth or tenderness. Cap refil <2 sec all extrem. No deformities or joint swelling noted.  Neuro: A/Ox3. No focal deficits noted.   Skin: Warm, no lesions or rashe Psych: Normal affect and behavior. Judgement and thought content appropriate.     Lab Results:  CBC    Component Value Date/Time   WBC 8.2 11/02/2022 0740   RBC 3.05 (L) 11/02/2022 0740   HGB 9.9 (L) 11/02/2022 0740   HCT 29.6 (L) 11/02/2022 0740   PLT 314 11/02/2022 0740   MCV 97.0 11/02/2022 0740   MCH 32.5 11/02/2022 0740   MCHC 33.4  11/02/2022 0740   RDW 15.0 11/02/2022 0740   LYMPHSABS 0.9 10/27/2022 0720   MONOABS 0.7 10/27/2022 0720   EOSABS 0.1 10/27/2022 0720   BASOSABS 0.1 10/27/2022 0720    BMET    Component Value Date/Time   NA 135 11/02/2022 0740   K 4.1 11/02/2022 0740   CL 107 11/02/2022 0740   CO2 21 (L) 11/02/2022 0740   GLUCOSE 90 11/02/2022 0740   BUN 24 (H) 11/02/2022 0740   CREATININE 0.72 11/02/2022 0740   CALCIUM 8.5 (L) 11/02/2022 0740   GFRNONAA >60 11/02/2022 0740   GFRAA >60 01/22/2017 1255    BNP    Component Value Date/Time   BNP 96.9 10/24/2022 0434     Imaging:  CT Chest Wo Contrast  Result Date: 12/09/2022 CLINICAL DATA:  Follow-up multiple pulmonary nodules EXAM: CT CHEST WITHOUT CONTRAST TECHNIQUE: Multidetector CT imaging of the chest was performed following the standard protocol without IV contrast. RADIATION DOSE REDUCTION: This exam was performed according to the departmental dose-optimization program which includes automated exposure control, adjustment of the mA and/or kV according to patient size and/or use of iterative reconstruction technique. COMPARISON:  Chest CT 10/24/2022 FINDINGS: Cardiovascular: The heart is normal in size. No pericardial effusion. The aorta is within normal limits in caliber. Moderate atherosclerotic calcifications. Scattered coronary artery calcifications. Mediastinum/Nodes: Stable borderline mediastinal and hilar lymph nodes, likely reactive given the lung findings. The esophagus is grossly normal. There is a small to moderate-sized hiatal hernia noted. Lungs/Pleura: Biapical pleural and parenchymal scarring changes. Persistent areas of tree-in-bud changes along with peribronchial thickening and airspace nodularity most notably in both lower lobes. Findings most consistent with atypical infection such as MAC. New subpleural airspace opacity in the left lower lobe laterally could be an acute infiltrate rounded atelectasis. No pulmonary edema. Mild  areas of bronchiectasis. Upper Abdomen: No significant upper abdominal findings. 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 No imaging follow-up is necessary. Musculoskeletal: No significant bony findings. IMPRESSION: 1. Persistent areas of tree-in-bud changes along with peribronchial thickening and airspace nodularity most notably in both lower lobes. Findings most consistent with atypical infection such as MAC. Recommend pulmonary consultation. A follow-up CT scan after appropriate treatment is suggested. 2. New subpleural airspace opacity in the left lower lobe laterally could be an acute infiltrate or rounded atelectasis. 3. Stable borderline mediastinal and hilar lymph nodes, likely reactive given the lung findings. 4. Small to moderate-sized hiatal hernia. Electronically Signed   By: Marijo Sanes M.D.   On: 12/09/2022 13:06  No data to display          No results found for: "NITRICOXIDE"      Assessment & Plan:   Multifocal pneumonia Clinically improved. Her recent CT chest had a new LLL opacity and persistent tree-in-bud opacities, unchanged from prior and likely chronic. She has no infectious symptoms today. Possible that the new opacity was related to recent COVID infection and now resolving. We will closely monitor her symptoms. Reviewed return/ED precautions. Target mucociliary clearance therapies. Discuss repeat imaging at follow up but at this point, I would continue to monitor her clinically.  Patient Instructions  Continue Eliquis 5 mg Twice daily. Monitor for any excessive bruising or bleeding. If you fall, ever hit your head or develop severe sudden headaches, you need to go to the emergency department for evaluation.   Elevate your feet when you are sitting. Ask the facility for an egg crate cushion to sit on   I have sent orders for a nebulizer and the medications just in case you have trouble with cough and chest  congestion Hypertonic saline nebs 3 mL as needed for chest congestion/increased cough Guaifenesin 600 mg Twice daily as needed for chest congestion/cough  Use flutter valve as needed for chest congestion/cough  Labs today - BNP  Follow up in 6 weeks with Dr. Verlee Monte to determine timing of next scan. If symptoms do not improve or worsen, please contact office for sooner follow up or seek emergency care.    Acute pulmonary embolism (HCC) Acute PE without right heart strain. Likely provoked in the setting of acute infection. She is doing well on Eliquis twice daily. She will need a minimum of 3-6 months of therapy. Reviewed red flag symptoms with anticoagulation therapy.   DVT (deep venous thrombosis) ([place]) DVT in RLL on venous US. They did not complete full exam of the left leg but possible BLE edema is related to DVTs. Given she has had some increased swelling and weight gain, we will double check her BNP to ensure it is not elevated. Otherwise, encouraged her to continue wrapping her legs and utilize diuretic therapy. I also advised her to elevate her legs while sitting. See above regarding anticoagulation therapy.  Bronchiectasis without complication ([place]) BTX with tree in bud nodularity, consistent with underlying possible MAI. She would not be a candidate for MAI therapy given her age and frailty. Reviewed disease process. Reviewed mucociliary clearance therapies. See above plan.   I spent 45 minutes of dedicated to the care of this patient on the date of this encounter to include pre-visit review of records, face-to-face time with the patient discussing conditions above, post visit ordering of testing, clinical documentation with the electronic health record, making appropriate referrals as documented, and communicating necessary findings to members of the patients care team.  Clayton Bibles, NP 12/11/2022  Pt aware and understands NP's role.

## 2022-12-11 NOTE — Assessment & Plan Note (Addendum)
Clinically improved. Her recent CT chest had a new LLL opacity and persistent tree-in-bud opacities, unchanged from prior and likely chronic. She has no infectious symptoms today. Possible that the new opacity was related to recent COVID infection and now resolving. We will closely monitor her symptoms. Reviewed return/ED precautions. Target mucociliary clearance therapies. Discuss repeat imaging at follow up but at this point, I would continue to monitor her clinically.  Patient Instructions  Continue Eliquis 5 mg Twice daily. Monitor for any excessive bruising or bleeding. If you fall, ever hit your head or develop severe sudden headaches, you need to go to the emergency department for evaluation.   Elevate your feet when you are sitting. Ask the facility for an egg crate cushion to sit on   I have sent orders for a nebulizer and the medications just in case you have trouble with cough and chest congestion Hypertonic saline nebs 3 mL as needed for chest congestion/increased cough Guaifenesin 600 mg Twice daily as needed for chest congestion/cough  Use flutter valve as needed for chest congestion/cough  Labs today - BNP  Follow up in 6 weeks with Dr. Verlee Monte to determine timing of next scan. If symptoms do not improve or worsen, please contact office for sooner follow up or seek emergency care.

## 2022-12-11 NOTE — Assessment & Plan Note (Signed)
BTX with tree in bud nodularity, consistent with underlying possible MAI. She would not be a candidate for MAI therapy given her age and frailty. Reviewed disease process. Reviewed mucociliary clearance therapies. See above plan.

## 2022-12-15 DIAGNOSIS — I10 Essential (primary) hypertension: Secondary | ICD-10-CM | POA: Diagnosis not present

## 2022-12-15 DIAGNOSIS — I82401 Acute embolism and thrombosis of unspecified deep veins of right lower extremity: Secondary | ICD-10-CM | POA: Diagnosis not present

## 2022-12-15 DIAGNOSIS — R600 Localized edema: Secondary | ICD-10-CM | POA: Diagnosis not present

## 2022-12-15 DIAGNOSIS — R4189 Other symptoms and signs involving cognitive functions and awareness: Secondary | ICD-10-CM | POA: Diagnosis not present

## 2022-12-18 DIAGNOSIS — M6281 Muscle weakness (generalized): Secondary | ICD-10-CM | POA: Diagnosis not present

## 2022-12-18 DIAGNOSIS — I2699 Other pulmonary embolism without acute cor pulmonale: Secondary | ICD-10-CM | POA: Diagnosis not present

## 2022-12-18 DIAGNOSIS — R1312 Dysphagia, oropharyngeal phase: Secondary | ICD-10-CM | POA: Diagnosis not present

## 2022-12-18 DIAGNOSIS — J188 Other pneumonia, unspecified organism: Secondary | ICD-10-CM | POA: Diagnosis not present

## 2022-12-18 DIAGNOSIS — R278 Other lack of coordination: Secondary | ICD-10-CM | POA: Diagnosis not present

## 2022-12-18 DIAGNOSIS — Z9181 History of falling: Secondary | ICD-10-CM | POA: Diagnosis not present

## 2022-12-18 DIAGNOSIS — I82409 Acute embolism and thrombosis of unspecified deep veins of unspecified lower extremity: Secondary | ICD-10-CM | POA: Diagnosis not present

## 2022-12-18 DIAGNOSIS — R531 Weakness: Secondary | ICD-10-CM | POA: Diagnosis not present

## 2022-12-18 DIAGNOSIS — E46 Unspecified protein-calorie malnutrition: Secondary | ICD-10-CM | POA: Diagnosis not present

## 2022-12-18 DIAGNOSIS — R2681 Unsteadiness on feet: Secondary | ICD-10-CM | POA: Diagnosis not present

## 2022-12-18 DIAGNOSIS — R293 Abnormal posture: Secondary | ICD-10-CM | POA: Diagnosis not present

## 2022-12-18 DIAGNOSIS — F0390 Unspecified dementia without behavioral disturbance: Secondary | ICD-10-CM | POA: Diagnosis not present

## 2022-12-20 DIAGNOSIS — I82401 Acute embolism and thrombosis of unspecified deep veins of right lower extremity: Secondary | ICD-10-CM | POA: Diagnosis not present

## 2022-12-20 DIAGNOSIS — I2699 Other pulmonary embolism without acute cor pulmonale: Secondary | ICD-10-CM | POA: Diagnosis not present

## 2022-12-20 DIAGNOSIS — M542 Cervicalgia: Secondary | ICD-10-CM | POA: Diagnosis not present

## 2022-12-20 DIAGNOSIS — H409 Unspecified glaucoma: Secondary | ICD-10-CM | POA: Diagnosis not present

## 2022-12-20 DIAGNOSIS — R4189 Other symptoms and signs involving cognitive functions and awareness: Secondary | ICD-10-CM | POA: Diagnosis not present

## 2022-12-20 DIAGNOSIS — R600 Localized edema: Secondary | ICD-10-CM | POA: Diagnosis not present

## 2022-12-21 DIAGNOSIS — J188 Other pneumonia, unspecified organism: Secondary | ICD-10-CM | POA: Diagnosis not present

## 2022-12-21 DIAGNOSIS — R1312 Dysphagia, oropharyngeal phase: Secondary | ICD-10-CM | POA: Diagnosis not present

## 2022-12-21 DIAGNOSIS — R293 Abnormal posture: Secondary | ICD-10-CM | POA: Diagnosis not present

## 2022-12-21 DIAGNOSIS — I2699 Other pulmonary embolism without acute cor pulmonale: Secondary | ICD-10-CM | POA: Diagnosis not present

## 2022-12-21 DIAGNOSIS — I82409 Acute embolism and thrombosis of unspecified deep veins of unspecified lower extremity: Secondary | ICD-10-CM | POA: Diagnosis not present

## 2022-12-21 DIAGNOSIS — M6281 Muscle weakness (generalized): Secondary | ICD-10-CM | POA: Diagnosis not present

## 2022-12-21 DIAGNOSIS — R2681 Unsteadiness on feet: Secondary | ICD-10-CM | POA: Diagnosis not present

## 2022-12-21 DIAGNOSIS — Z9181 History of falling: Secondary | ICD-10-CM | POA: Diagnosis not present

## 2022-12-21 DIAGNOSIS — R278 Other lack of coordination: Secondary | ICD-10-CM | POA: Diagnosis not present

## 2022-12-22 DIAGNOSIS — R2681 Unsteadiness on feet: Secondary | ICD-10-CM | POA: Diagnosis not present

## 2022-12-22 DIAGNOSIS — Z9181 History of falling: Secondary | ICD-10-CM | POA: Diagnosis not present

## 2022-12-22 DIAGNOSIS — R278 Other lack of coordination: Secondary | ICD-10-CM | POA: Diagnosis not present

## 2022-12-22 DIAGNOSIS — R1312 Dysphagia, oropharyngeal phase: Secondary | ICD-10-CM | POA: Diagnosis not present

## 2022-12-22 DIAGNOSIS — I2699 Other pulmonary embolism without acute cor pulmonale: Secondary | ICD-10-CM | POA: Diagnosis not present

## 2022-12-22 DIAGNOSIS — I82409 Acute embolism and thrombosis of unspecified deep veins of unspecified lower extremity: Secondary | ICD-10-CM | POA: Diagnosis not present

## 2022-12-22 DIAGNOSIS — M6281 Muscle weakness (generalized): Secondary | ICD-10-CM | POA: Diagnosis not present

## 2022-12-22 DIAGNOSIS — J188 Other pneumonia, unspecified organism: Secondary | ICD-10-CM | POA: Diagnosis not present

## 2022-12-22 DIAGNOSIS — R293 Abnormal posture: Secondary | ICD-10-CM | POA: Diagnosis not present

## 2022-12-23 DIAGNOSIS — R1312 Dysphagia, oropharyngeal phase: Secondary | ICD-10-CM | POA: Diagnosis not present

## 2022-12-23 DIAGNOSIS — Z9181 History of falling: Secondary | ICD-10-CM | POA: Diagnosis not present

## 2022-12-23 DIAGNOSIS — R293 Abnormal posture: Secondary | ICD-10-CM | POA: Diagnosis not present

## 2022-12-23 DIAGNOSIS — R278 Other lack of coordination: Secondary | ICD-10-CM | POA: Diagnosis not present

## 2022-12-23 DIAGNOSIS — I82409 Acute embolism and thrombosis of unspecified deep veins of unspecified lower extremity: Secondary | ICD-10-CM | POA: Diagnosis not present

## 2022-12-23 DIAGNOSIS — R2681 Unsteadiness on feet: Secondary | ICD-10-CM | POA: Diagnosis not present

## 2022-12-23 DIAGNOSIS — I2699 Other pulmonary embolism without acute cor pulmonale: Secondary | ICD-10-CM | POA: Diagnosis not present

## 2022-12-23 DIAGNOSIS — J188 Other pneumonia, unspecified organism: Secondary | ICD-10-CM | POA: Diagnosis not present

## 2022-12-23 DIAGNOSIS — M6281 Muscle weakness (generalized): Secondary | ICD-10-CM | POA: Diagnosis not present

## 2022-12-24 DIAGNOSIS — M6281 Muscle weakness (generalized): Secondary | ICD-10-CM | POA: Diagnosis not present

## 2022-12-24 DIAGNOSIS — R1312 Dysphagia, oropharyngeal phase: Secondary | ICD-10-CM | POA: Diagnosis not present

## 2022-12-24 DIAGNOSIS — I2699 Other pulmonary embolism without acute cor pulmonale: Secondary | ICD-10-CM | POA: Diagnosis not present

## 2022-12-24 DIAGNOSIS — R293 Abnormal posture: Secondary | ICD-10-CM | POA: Diagnosis not present

## 2022-12-24 DIAGNOSIS — R278 Other lack of coordination: Secondary | ICD-10-CM | POA: Diagnosis not present

## 2022-12-24 DIAGNOSIS — J188 Other pneumonia, unspecified organism: Secondary | ICD-10-CM | POA: Diagnosis not present

## 2022-12-24 DIAGNOSIS — R2681 Unsteadiness on feet: Secondary | ICD-10-CM | POA: Diagnosis not present

## 2022-12-24 DIAGNOSIS — I82409 Acute embolism and thrombosis of unspecified deep veins of unspecified lower extremity: Secondary | ICD-10-CM | POA: Diagnosis not present

## 2022-12-24 DIAGNOSIS — Z9181 History of falling: Secondary | ICD-10-CM | POA: Diagnosis not present

## 2022-12-25 DIAGNOSIS — Z9181 History of falling: Secondary | ICD-10-CM | POA: Diagnosis not present

## 2022-12-25 DIAGNOSIS — R2681 Unsteadiness on feet: Secondary | ICD-10-CM | POA: Diagnosis not present

## 2022-12-25 DIAGNOSIS — R293 Abnormal posture: Secondary | ICD-10-CM | POA: Diagnosis not present

## 2022-12-25 DIAGNOSIS — R1312 Dysphagia, oropharyngeal phase: Secondary | ICD-10-CM | POA: Diagnosis not present

## 2022-12-25 DIAGNOSIS — J188 Other pneumonia, unspecified organism: Secondary | ICD-10-CM | POA: Diagnosis not present

## 2022-12-25 DIAGNOSIS — R278 Other lack of coordination: Secondary | ICD-10-CM | POA: Diagnosis not present

## 2022-12-25 DIAGNOSIS — I2699 Other pulmonary embolism without acute cor pulmonale: Secondary | ICD-10-CM | POA: Diagnosis not present

## 2022-12-25 DIAGNOSIS — I82409 Acute embolism and thrombosis of unspecified deep veins of unspecified lower extremity: Secondary | ICD-10-CM | POA: Diagnosis not present

## 2022-12-25 DIAGNOSIS — M6281 Muscle weakness (generalized): Secondary | ICD-10-CM | POA: Diagnosis not present

## 2022-12-28 DIAGNOSIS — R2681 Unsteadiness on feet: Secondary | ICD-10-CM | POA: Diagnosis not present

## 2022-12-28 DIAGNOSIS — J188 Other pneumonia, unspecified organism: Secondary | ICD-10-CM | POA: Diagnosis not present

## 2022-12-28 DIAGNOSIS — I82409 Acute embolism and thrombosis of unspecified deep veins of unspecified lower extremity: Secondary | ICD-10-CM | POA: Diagnosis not present

## 2022-12-28 DIAGNOSIS — I2699 Other pulmonary embolism without acute cor pulmonale: Secondary | ICD-10-CM | POA: Diagnosis not present

## 2022-12-28 DIAGNOSIS — Z9181 History of falling: Secondary | ICD-10-CM | POA: Diagnosis not present

## 2022-12-28 DIAGNOSIS — H401113 Primary open-angle glaucoma, right eye, severe stage: Secondary | ICD-10-CM | POA: Diagnosis not present

## 2022-12-28 DIAGNOSIS — R278 Other lack of coordination: Secondary | ICD-10-CM | POA: Diagnosis not present

## 2022-12-28 DIAGNOSIS — R1312 Dysphagia, oropharyngeal phase: Secondary | ICD-10-CM | POA: Diagnosis not present

## 2022-12-28 DIAGNOSIS — M6281 Muscle weakness (generalized): Secondary | ICD-10-CM | POA: Diagnosis not present

## 2022-12-28 DIAGNOSIS — H401122 Primary open-angle glaucoma, left eye, moderate stage: Secondary | ICD-10-CM | POA: Diagnosis not present

## 2022-12-28 DIAGNOSIS — R293 Abnormal posture: Secondary | ICD-10-CM | POA: Diagnosis not present

## 2022-12-29 DIAGNOSIS — R600 Localized edema: Secondary | ICD-10-CM | POA: Diagnosis not present

## 2022-12-29 DIAGNOSIS — I2699 Other pulmonary embolism without acute cor pulmonale: Secondary | ICD-10-CM | POA: Diagnosis not present

## 2022-12-29 DIAGNOSIS — R293 Abnormal posture: Secondary | ICD-10-CM | POA: Diagnosis not present

## 2022-12-29 DIAGNOSIS — R1312 Dysphagia, oropharyngeal phase: Secondary | ICD-10-CM | POA: Diagnosis not present

## 2022-12-29 DIAGNOSIS — R4189 Other symptoms and signs involving cognitive functions and awareness: Secondary | ICD-10-CM | POA: Diagnosis not present

## 2022-12-29 DIAGNOSIS — R278 Other lack of coordination: Secondary | ICD-10-CM | POA: Diagnosis not present

## 2022-12-29 DIAGNOSIS — R2681 Unsteadiness on feet: Secondary | ICD-10-CM | POA: Diagnosis not present

## 2022-12-29 DIAGNOSIS — I10 Essential (primary) hypertension: Secondary | ICD-10-CM | POA: Diagnosis not present

## 2022-12-29 DIAGNOSIS — M6281 Muscle weakness (generalized): Secondary | ICD-10-CM | POA: Diagnosis not present

## 2022-12-29 DIAGNOSIS — Z9181 History of falling: Secondary | ICD-10-CM | POA: Diagnosis not present

## 2022-12-29 DIAGNOSIS — J188 Other pneumonia, unspecified organism: Secondary | ICD-10-CM | POA: Diagnosis not present

## 2022-12-29 DIAGNOSIS — I82401 Acute embolism and thrombosis of unspecified deep veins of right lower extremity: Secondary | ICD-10-CM | POA: Diagnosis not present

## 2022-12-29 DIAGNOSIS — I82409 Acute embolism and thrombosis of unspecified deep veins of unspecified lower extremity: Secondary | ICD-10-CM | POA: Diagnosis not present

## 2022-12-30 DIAGNOSIS — J188 Other pneumonia, unspecified organism: Secondary | ICD-10-CM | POA: Diagnosis not present

## 2022-12-30 DIAGNOSIS — R278 Other lack of coordination: Secondary | ICD-10-CM | POA: Diagnosis not present

## 2022-12-30 DIAGNOSIS — R1312 Dysphagia, oropharyngeal phase: Secondary | ICD-10-CM | POA: Diagnosis not present

## 2022-12-30 DIAGNOSIS — I2699 Other pulmonary embolism without acute cor pulmonale: Secondary | ICD-10-CM | POA: Diagnosis not present

## 2022-12-30 DIAGNOSIS — Z9181 History of falling: Secondary | ICD-10-CM | POA: Diagnosis not present

## 2022-12-30 DIAGNOSIS — I82409 Acute embolism and thrombosis of unspecified deep veins of unspecified lower extremity: Secondary | ICD-10-CM | POA: Diagnosis not present

## 2022-12-30 DIAGNOSIS — M6281 Muscle weakness (generalized): Secondary | ICD-10-CM | POA: Diagnosis not present

## 2022-12-30 DIAGNOSIS — R293 Abnormal posture: Secondary | ICD-10-CM | POA: Diagnosis not present

## 2022-12-30 DIAGNOSIS — R2681 Unsteadiness on feet: Secondary | ICD-10-CM | POA: Diagnosis not present

## 2022-12-31 DIAGNOSIS — J188 Other pneumonia, unspecified organism: Secondary | ICD-10-CM | POA: Diagnosis not present

## 2022-12-31 DIAGNOSIS — R278 Other lack of coordination: Secondary | ICD-10-CM | POA: Diagnosis not present

## 2022-12-31 DIAGNOSIS — R293 Abnormal posture: Secondary | ICD-10-CM | POA: Diagnosis not present

## 2022-12-31 DIAGNOSIS — I2699 Other pulmonary embolism without acute cor pulmonale: Secondary | ICD-10-CM | POA: Diagnosis not present

## 2022-12-31 DIAGNOSIS — R2681 Unsteadiness on feet: Secondary | ICD-10-CM | POA: Diagnosis not present

## 2022-12-31 DIAGNOSIS — Z9181 History of falling: Secondary | ICD-10-CM | POA: Diagnosis not present

## 2022-12-31 DIAGNOSIS — R1312 Dysphagia, oropharyngeal phase: Secondary | ICD-10-CM | POA: Diagnosis not present

## 2022-12-31 DIAGNOSIS — M6281 Muscle weakness (generalized): Secondary | ICD-10-CM | POA: Diagnosis not present

## 2022-12-31 DIAGNOSIS — I82409 Acute embolism and thrombosis of unspecified deep veins of unspecified lower extremity: Secondary | ICD-10-CM | POA: Diagnosis not present

## 2023-01-01 DIAGNOSIS — I2699 Other pulmonary embolism without acute cor pulmonale: Secondary | ICD-10-CM | POA: Diagnosis not present

## 2023-01-01 DIAGNOSIS — E46 Unspecified protein-calorie malnutrition: Secondary | ICD-10-CM | POA: Diagnosis not present

## 2023-01-01 DIAGNOSIS — F0390 Unspecified dementia without behavioral disturbance: Secondary | ICD-10-CM | POA: Diagnosis not present

## 2023-01-01 DIAGNOSIS — R531 Weakness: Secondary | ICD-10-CM | POA: Diagnosis not present

## 2023-01-12 DIAGNOSIS — J188 Other pneumonia, unspecified organism: Secondary | ICD-10-CM | POA: Diagnosis not present

## 2023-01-12 DIAGNOSIS — M6281 Muscle weakness (generalized): Secondary | ICD-10-CM | POA: Diagnosis not present

## 2023-01-12 DIAGNOSIS — Z9181 History of falling: Secondary | ICD-10-CM | POA: Diagnosis not present

## 2023-01-12 DIAGNOSIS — I82409 Acute embolism and thrombosis of unspecified deep veins of unspecified lower extremity: Secondary | ICD-10-CM | POA: Diagnosis not present

## 2023-01-12 DIAGNOSIS — R1312 Dysphagia, oropharyngeal phase: Secondary | ICD-10-CM | POA: Diagnosis not present

## 2023-01-12 DIAGNOSIS — R2681 Unsteadiness on feet: Secondary | ICD-10-CM | POA: Diagnosis not present

## 2023-01-12 DIAGNOSIS — R293 Abnormal posture: Secondary | ICD-10-CM | POA: Diagnosis not present

## 2023-01-12 DIAGNOSIS — I2699 Other pulmonary embolism without acute cor pulmonale: Secondary | ICD-10-CM | POA: Diagnosis not present

## 2023-01-12 DIAGNOSIS — R278 Other lack of coordination: Secondary | ICD-10-CM | POA: Diagnosis not present

## 2023-01-17 NOTE — Progress Notes (Unsigned)
Synopsis: Referred for abnormal CT Chest by Kristen Loader, FNP  Subjective:   PATIENT ID: Julia Hampton GENDER: female DOB: 07-22-1930, MRN: [id number]  No chief complaint on file.  89yF with history of PE/DVT RLE, bronchiectasis with persistent TIB nodularity on CT Chest suggestive of NTM  Otherwise pertinent review of systems is negative.  Past Medical History:  Diagnosis Date   Complication of anesthesia    trouble waking patient up   Glaucoma      Family History  Problem Relation Age of Onset   Heart failure Mother    Parkinson's disease Father      Past Surgical History:  Procedure Laterality Date   ABDOMINAL HYSTERECTOMY     CHOLECYSTECTOMY     HIP ARTHROPLASTY Left 09/28/2013   Procedure: ARTHROPLASTY BIPOLAR HIP;  Surgeon: Johnn Hai, MD;  Location: [facility];  Service: Orthopedics;  Laterality: Left;   MASTOIDECTOMY      Social History   Socioeconomic History   Marital status: Divorced    Spouse name: Not on file   Number of children: Not on file   Years of education: Not on file   Highest education level: Not on file  Occupational History   Not on file  Tobacco Use   Smoking status: Never   Smokeless tobacco: Never  Substance and Sexual Activity   Alcohol use: No   Drug use: No   Sexual activity: Never  Other Topics Concern   Not on file  Social History Narrative   Not on file   Social Determinants of Health   Financial Resource Strain: Not on file  Food Insecurity: No Food Insecurity (10/24/2022)   Hunger Vital Sign    Worried About Running Out of Food in the Last Year: Never true    Ran Out of Food in the Last Year: Never true  Transportation Needs: No Transportation Needs (10/24/2022)   PRAPARE - Hydrologist (Medical): No    Lack of Transportation (Non-Medical): No  Physical Activity: Not on file  Stress: Not on file  Social Connections: Not on file  Intimate Partner Violence: Not At Risk (10/24/2022)    Humiliation, Afraid, Rape, and Kick questionnaire    Fear of Current or Ex-Partner: No    Emotionally Abused: No    Physically Abused: No    Sexually Abused: No     Allergies  Allergen Reactions   Other Other (See Comments)    Glaucoma drop with a dark green top - not sure of name -     Outpatient Medications Prior to Visit  Medication Sig Dispense Refill   acetaminophen (TYLENOL) 500 MG tablet Take 500 mg by mouth every 6 (six) hours as needed for mild pain or moderate pain.     amLODipine (NORVASC) 5 MG tablet Take 5 mg by mouth daily.     apixaban (ELIQUIS) 5 MG TABS tablet Take 2 tablets (10 mg total) by mouth 2 (two) times daily for 3 days. 60 tablet    apixaban (ELIQUIS) 5 MG TABS tablet Take 1 tablet (5 mg total) by mouth 2 (two) times daily. 60 tablet    bismuth subsalicylate (PEPTO-BISMOL) 262 MG/15ML suspension Take 30 mLs by mouth every 6 (six) hours as needed. (Patient taking differently: Take 30 mLs by mouth every 6 (six) hours as needed for indigestion.) 360 mL 0   brimonidine (ALPHAGAN) 0.2 % ophthalmic solution Place 1 drop into the right eye 2 (two) times daily.  citalopram (CELEXA) 20 MG tablet Take 20 mg by mouth daily.     dorzolamide-timolol (COSOPT) 2-0.5 % ophthalmic solution Place 1 drop into both eyes 2 (two) times daily.     feeding supplement, ENSURE ENLIVE, (ENSURE ENLIVE) LIQD Take 237 mLs by mouth 3 (three) times daily between meals. 237 mL 12   latanoprost (XALATAN) 0.005 % ophthalmic solution Place 1 drop into the right eye at bedtime.     mirtazapine (REMERON) 7.5 MG tablet Take 1 tablet (7.5 mg total) by mouth at bedtime.     pilocarpine (PILOCAR) 4 % ophthalmic solution Place 1 drop into the right eye 2 (two) times daily.     sodium chloride HYPERTONIC 3 % nebulizer solution Take by nebulization as needed for other. 750 mL 2   No facility-administered medications prior to visit.       Objective:   Physical Exam:  General appearance: 89  y.o., female, NAD, conversant  Eyes: anicteric sclerae; PERRL, tracking appropriately HENT: NCAT; MMM Neck: Trachea midline; no lymphadenopathy, no JVD Lungs: CTAB, no crackles, no wheeze, with normal respiratory effort CV: RRR, no murmur  Abdomen: Soft, non-tender; non-distended, BS present  Extremities: No peripheral edema, warm Skin: Normal turgor and texture; no rash Psych: Appropriate affect Neuro: Alert and oriented to person and place, no focal deficit     There were no vitals filed for this visit.   on *** LPM *** RA BMI Readings from Last 3 Encounters:  12/11/22 18.18 kg/m  11/02/22 17.17 kg/m  10/24/22 17.17 kg/m   Wt Readings from Last 3 Encounters:  12/11/22 96 lb 3.2 oz (43.6 kg)  11/02/22 85 lb (38.6 kg)  10/24/22 85 lb (38.6 kg)     CBC    Component Value Date/Time   WBC 8.2 11/02/2022 0740   RBC 3.05 (L) 11/02/2022 0740   HGB 9.9 (L) 11/02/2022 0740   HCT 29.6 (L) 11/02/2022 0740   PLT 314 11/02/2022 0740   MCV 97.0 11/02/2022 0740   MCH 32.5 11/02/2022 0740   MCHC 33.4 11/02/2022 0740   RDW 15.0 11/02/2022 0740   LYMPHSABS 0.9 10/27/2022 0720   MONOABS 0.7 10/27/2022 0720   EOSABS 0.1 10/27/2022 0720   BASOSABS 0.1 10/27/2022 0720    ***  Chest Imaging: Ct Chest 12/09/22 with TIB nodularity, LLL nodular consolidation - likely another area of mucoid impaction  Pulmonary Functions Testing Results:     No data to display           Echocardiogram:   1. Left ventricular ejection fraction, by estimation, is 65 to 70%. The  left ventricle has normal function. The left ventricle has no regional  wall motion abnormalities. There is moderate left ventricular hypertrophy.  Left ventricular diastolic  parameters are indeterminate. Elevated left ventricular end-diastolic  pressure.   2. Right ventricular systolic function is normal. The right ventricular  size is normal. There is normal pulmonary artery systolic pressure.   3. The mitral  valve is grossly normal. Trivial mitral valve  regurgitation. No evidence of mitral stenosis.   4. The aortic valve is tricuspid. There is mild calcification of the  aortic valve. There is mild thickening of the aortic valve. Aortic valve  regurgitation is trivial. Aortic valve sclerosis is present, with no  evidence of aortic valve stenosis.   5. The inferior vena cava is normal in size with greater than 50%  respiratory variability, suggesting right atrial pressure of 3 mmHg.       Assessment &  Plan:    Plan:      Maryjane Hurter, MD [facility] 01/17/2023 4:36 PM

## 2023-01-18 DIAGNOSIS — Z9181 History of falling: Secondary | ICD-10-CM | POA: Diagnosis not present

## 2023-01-18 DIAGNOSIS — M6281 Muscle weakness (generalized): Secondary | ICD-10-CM | POA: Diagnosis not present

## 2023-01-18 DIAGNOSIS — R293 Abnormal posture: Secondary | ICD-10-CM | POA: Diagnosis not present

## 2023-01-18 DIAGNOSIS — I82409 Acute embolism and thrombosis of unspecified deep veins of unspecified lower extremity: Secondary | ICD-10-CM | POA: Diagnosis not present

## 2023-01-18 DIAGNOSIS — S0101XA Laceration without foreign body of scalp, initial encounter: Secondary | ICD-10-CM | POA: Diagnosis not present

## 2023-01-18 DIAGNOSIS — I2699 Other pulmonary embolism without acute cor pulmonale: Secondary | ICD-10-CM | POA: Diagnosis not present

## 2023-01-18 DIAGNOSIS — R2681 Unsteadiness on feet: Secondary | ICD-10-CM | POA: Diagnosis not present

## 2023-01-18 DIAGNOSIS — J188 Other pneumonia, unspecified organism: Secondary | ICD-10-CM | POA: Diagnosis not present

## 2023-01-18 DIAGNOSIS — R278 Other lack of coordination: Secondary | ICD-10-CM | POA: Diagnosis not present

## 2023-01-19 ENCOUNTER — Encounter: Payer: Self-pay | Admitting: Student

## 2023-01-19 ENCOUNTER — Ambulatory Visit: Payer: Medicare PPO | Admitting: Student

## 2023-01-19 VITALS — BP 112/68 | HR 78 | Temp 98.0°F | Ht 61.0 in | Wt 99.6 lb

## 2023-01-19 DIAGNOSIS — J479 Bronchiectasis, uncomplicated: Secondary | ICD-10-CM | POA: Diagnosis not present

## 2023-01-19 DIAGNOSIS — R278 Other lack of coordination: Secondary | ICD-10-CM | POA: Diagnosis not present

## 2023-01-19 DIAGNOSIS — S0101XA Laceration without foreign body of scalp, initial encounter: Secondary | ICD-10-CM | POA: Diagnosis not present

## 2023-01-19 DIAGNOSIS — M6281 Muscle weakness (generalized): Secondary | ICD-10-CM | POA: Diagnosis not present

## 2023-01-19 DIAGNOSIS — Z9181 History of falling: Secondary | ICD-10-CM | POA: Diagnosis not present

## 2023-01-19 DIAGNOSIS — I2699 Other pulmonary embolism without acute cor pulmonale: Secondary | ICD-10-CM

## 2023-01-19 DIAGNOSIS — I824Y9 Acute embolism and thrombosis of unspecified deep veins of unspecified proximal lower extremity: Secondary | ICD-10-CM

## 2023-01-19 DIAGNOSIS — J188 Other pneumonia, unspecified organism: Secondary | ICD-10-CM | POA: Diagnosis not present

## 2023-01-19 DIAGNOSIS — R293 Abnormal posture: Secondary | ICD-10-CM | POA: Diagnosis not present

## 2023-01-19 DIAGNOSIS — R2681 Unsteadiness on feet: Secondary | ICD-10-CM | POA: Diagnosis not present

## 2023-01-19 DIAGNOSIS — I82409 Acute embolism and thrombosis of unspecified deep veins of unspecified lower extremity: Secondary | ICD-10-CM | POA: Diagnosis not present

## 2023-01-19 MED ORDER — GUAIFENESIN ER 600 MG PO TB12: 600.0000 mg | ORAL_TABLET | Freq: Two times a day (BID) | ORAL | 3 refills | Status: DC | PRN

## 2023-01-19 MED ORDER — ALBUTEROL SULFATE (2.5 MG/3ML) 0.083% IN NEBU: 2.5000 mg | INHALATION_SOLUTION | Freq: Four times a day (QID) | RESPIRATORY_TRACT | 12 refills | Status: AC | PRN

## 2023-01-19 NOTE — Patient Instructions (Addendum)
-   If chest congestion, cough then try the following:  - albuterol nebulizer twice daily  - flutter valve 10 slow but firm puffs twice daily  - mucinex 600 mg twice daily to thin mucus - if persistent cough, shortness of breath, unintentional weight loss, low grade fever then call to make appointment - would go ahead and stop eliquis

## 2023-01-20 DIAGNOSIS — I82409 Acute embolism and thrombosis of unspecified deep veins of unspecified lower extremity: Secondary | ICD-10-CM | POA: Diagnosis not present

## 2023-01-20 DIAGNOSIS — Z9181 History of falling: Secondary | ICD-10-CM | POA: Diagnosis not present

## 2023-01-20 DIAGNOSIS — R293 Abnormal posture: Secondary | ICD-10-CM | POA: Diagnosis not present

## 2023-01-20 DIAGNOSIS — R278 Other lack of coordination: Secondary | ICD-10-CM | POA: Diagnosis not present

## 2023-01-20 DIAGNOSIS — R2681 Unsteadiness on feet: Secondary | ICD-10-CM | POA: Diagnosis not present

## 2023-01-20 DIAGNOSIS — I2699 Other pulmonary embolism without acute cor pulmonale: Secondary | ICD-10-CM | POA: Diagnosis not present

## 2023-01-20 DIAGNOSIS — M6281 Muscle weakness (generalized): Secondary | ICD-10-CM | POA: Diagnosis not present

## 2023-01-20 DIAGNOSIS — J188 Other pneumonia, unspecified organism: Secondary | ICD-10-CM | POA: Diagnosis not present

## 2023-01-20 DIAGNOSIS — S0101XA Laceration without foreign body of scalp, initial encounter: Secondary | ICD-10-CM | POA: Diagnosis not present

## 2023-01-21 DIAGNOSIS — Z9181 History of falling: Secondary | ICD-10-CM | POA: Diagnosis not present

## 2023-01-21 DIAGNOSIS — R4189 Other symptoms and signs involving cognitive functions and awareness: Secondary | ICD-10-CM | POA: Diagnosis not present

## 2023-01-21 DIAGNOSIS — J188 Other pneumonia, unspecified organism: Secondary | ICD-10-CM | POA: Diagnosis not present

## 2023-01-21 DIAGNOSIS — R600 Localized edema: Secondary | ICD-10-CM | POA: Diagnosis not present

## 2023-01-21 DIAGNOSIS — M6281 Muscle weakness (generalized): Secondary | ICD-10-CM | POA: Diagnosis not present

## 2023-01-21 DIAGNOSIS — R293 Abnormal posture: Secondary | ICD-10-CM | POA: Diagnosis not present

## 2023-01-21 DIAGNOSIS — J479 Bronchiectasis, uncomplicated: Secondary | ICD-10-CM | POA: Diagnosis not present

## 2023-01-21 DIAGNOSIS — R278 Other lack of coordination: Secondary | ICD-10-CM | POA: Diagnosis not present

## 2023-01-21 DIAGNOSIS — R2681 Unsteadiness on feet: Secondary | ICD-10-CM | POA: Diagnosis not present

## 2023-01-21 DIAGNOSIS — I2699 Other pulmonary embolism without acute cor pulmonale: Secondary | ICD-10-CM | POA: Diagnosis not present

## 2023-01-21 DIAGNOSIS — I82409 Acute embolism and thrombosis of unspecified deep veins of unspecified lower extremity: Secondary | ICD-10-CM | POA: Diagnosis not present

## 2023-01-21 DIAGNOSIS — S0101XA Laceration without foreign body of scalp, initial encounter: Secondary | ICD-10-CM | POA: Diagnosis not present

## 2023-01-22 DIAGNOSIS — M6281 Muscle weakness (generalized): Secondary | ICD-10-CM | POA: Diagnosis not present

## 2023-01-22 DIAGNOSIS — I2699 Other pulmonary embolism without acute cor pulmonale: Secondary | ICD-10-CM | POA: Diagnosis not present

## 2023-01-22 DIAGNOSIS — Z9181 History of falling: Secondary | ICD-10-CM | POA: Diagnosis not present

## 2023-01-22 DIAGNOSIS — J188 Other pneumonia, unspecified organism: Secondary | ICD-10-CM | POA: Diagnosis not present

## 2023-01-22 DIAGNOSIS — R2681 Unsteadiness on feet: Secondary | ICD-10-CM | POA: Diagnosis not present

## 2023-01-22 DIAGNOSIS — R293 Abnormal posture: Secondary | ICD-10-CM | POA: Diagnosis not present

## 2023-01-22 DIAGNOSIS — R278 Other lack of coordination: Secondary | ICD-10-CM | POA: Diagnosis not present

## 2023-01-22 DIAGNOSIS — S0101XA Laceration without foreign body of scalp, initial encounter: Secondary | ICD-10-CM | POA: Diagnosis not present

## 2023-01-22 DIAGNOSIS — I82409 Acute embolism and thrombosis of unspecified deep veins of unspecified lower extremity: Secondary | ICD-10-CM | POA: Diagnosis not present

## 2023-01-24 DIAGNOSIS — I2699 Other pulmonary embolism without acute cor pulmonale: Secondary | ICD-10-CM | POA: Diagnosis not present

## 2023-01-24 DIAGNOSIS — S0101XA Laceration without foreign body of scalp, initial encounter: Secondary | ICD-10-CM | POA: Diagnosis not present

## 2023-01-24 DIAGNOSIS — Z9181 History of falling: Secondary | ICD-10-CM | POA: Diagnosis not present

## 2023-01-24 DIAGNOSIS — M6281 Muscle weakness (generalized): Secondary | ICD-10-CM | POA: Diagnosis not present

## 2023-01-24 DIAGNOSIS — J188 Other pneumonia, unspecified organism: Secondary | ICD-10-CM | POA: Diagnosis not present

## 2023-01-24 DIAGNOSIS — I82409 Acute embolism and thrombosis of unspecified deep veins of unspecified lower extremity: Secondary | ICD-10-CM | POA: Diagnosis not present

## 2023-01-24 DIAGNOSIS — R278 Other lack of coordination: Secondary | ICD-10-CM | POA: Diagnosis not present

## 2023-01-24 DIAGNOSIS — R293 Abnormal posture: Secondary | ICD-10-CM | POA: Diagnosis not present

## 2023-01-24 DIAGNOSIS — R2681 Unsteadiness on feet: Secondary | ICD-10-CM | POA: Diagnosis not present

## 2023-01-25 DIAGNOSIS — R278 Other lack of coordination: Secondary | ICD-10-CM | POA: Diagnosis not present

## 2023-01-25 DIAGNOSIS — J188 Other pneumonia, unspecified organism: Secondary | ICD-10-CM | POA: Diagnosis not present

## 2023-01-25 DIAGNOSIS — I2699 Other pulmonary embolism without acute cor pulmonale: Secondary | ICD-10-CM | POA: Diagnosis not present

## 2023-01-25 DIAGNOSIS — S0101XA Laceration without foreign body of scalp, initial encounter: Secondary | ICD-10-CM | POA: Diagnosis not present

## 2023-01-25 DIAGNOSIS — M6281 Muscle weakness (generalized): Secondary | ICD-10-CM | POA: Diagnosis not present

## 2023-01-25 DIAGNOSIS — I82409 Acute embolism and thrombosis of unspecified deep veins of unspecified lower extremity: Secondary | ICD-10-CM | POA: Diagnosis not present

## 2023-01-25 DIAGNOSIS — R2681 Unsteadiness on feet: Secondary | ICD-10-CM | POA: Diagnosis not present

## 2023-01-25 DIAGNOSIS — Z9181 History of falling: Secondary | ICD-10-CM | POA: Diagnosis not present

## 2023-01-25 DIAGNOSIS — R293 Abnormal posture: Secondary | ICD-10-CM | POA: Diagnosis not present

## 2023-01-26 DIAGNOSIS — M6281 Muscle weakness (generalized): Secondary | ICD-10-CM | POA: Diagnosis not present

## 2023-01-26 DIAGNOSIS — Z9181 History of falling: Secondary | ICD-10-CM | POA: Diagnosis not present

## 2023-01-26 DIAGNOSIS — I2699 Other pulmonary embolism without acute cor pulmonale: Secondary | ICD-10-CM | POA: Diagnosis not present

## 2023-01-26 DIAGNOSIS — R278 Other lack of coordination: Secondary | ICD-10-CM | POA: Diagnosis not present

## 2023-01-26 DIAGNOSIS — R2681 Unsteadiness on feet: Secondary | ICD-10-CM | POA: Diagnosis not present

## 2023-01-26 DIAGNOSIS — S0101XA Laceration without foreign body of scalp, initial encounter: Secondary | ICD-10-CM | POA: Diagnosis not present

## 2023-01-26 DIAGNOSIS — J188 Other pneumonia, unspecified organism: Secondary | ICD-10-CM | POA: Diagnosis not present

## 2023-01-26 DIAGNOSIS — I82409 Acute embolism and thrombosis of unspecified deep veins of unspecified lower extremity: Secondary | ICD-10-CM | POA: Diagnosis not present

## 2023-01-26 DIAGNOSIS — R293 Abnormal posture: Secondary | ICD-10-CM | POA: Diagnosis not present

## 2023-01-28 DIAGNOSIS — R4189 Other symptoms and signs involving cognitive functions and awareness: Secondary | ICD-10-CM | POA: Diagnosis not present

## 2023-01-28 DIAGNOSIS — I10 Essential (primary) hypertension: Secondary | ICD-10-CM | POA: Diagnosis not present

## 2023-01-28 DIAGNOSIS — R600 Localized edema: Secondary | ICD-10-CM | POA: Diagnosis not present

## 2023-01-28 DIAGNOSIS — I2699 Other pulmonary embolism without acute cor pulmonale: Secondary | ICD-10-CM | POA: Diagnosis not present

## 2023-01-29 DIAGNOSIS — M6281 Muscle weakness (generalized): Secondary | ICD-10-CM | POA: Diagnosis not present

## 2023-01-29 DIAGNOSIS — S0101XA Laceration without foreign body of scalp, initial encounter: Secondary | ICD-10-CM | POA: Diagnosis not present

## 2023-01-29 DIAGNOSIS — R2681 Unsteadiness on feet: Secondary | ICD-10-CM | POA: Diagnosis not present

## 2023-01-29 DIAGNOSIS — I2699 Other pulmonary embolism without acute cor pulmonale: Secondary | ICD-10-CM | POA: Diagnosis not present

## 2023-01-29 DIAGNOSIS — R293 Abnormal posture: Secondary | ICD-10-CM | POA: Diagnosis not present

## 2023-01-29 DIAGNOSIS — R531 Weakness: Secondary | ICD-10-CM | POA: Diagnosis not present

## 2023-01-29 DIAGNOSIS — F0390 Unspecified dementia without behavioral disturbance: Secondary | ICD-10-CM | POA: Diagnosis not present

## 2023-01-29 DIAGNOSIS — R278 Other lack of coordination: Secondary | ICD-10-CM | POA: Diagnosis not present

## 2023-01-29 DIAGNOSIS — I82409 Acute embolism and thrombosis of unspecified deep veins of unspecified lower extremity: Secondary | ICD-10-CM | POA: Diagnosis not present

## 2023-01-29 DIAGNOSIS — Z9181 History of falling: Secondary | ICD-10-CM | POA: Diagnosis not present

## 2023-01-29 DIAGNOSIS — J188 Other pneumonia, unspecified organism: Secondary | ICD-10-CM | POA: Diagnosis not present

## 2023-01-29 DIAGNOSIS — E46 Unspecified protein-calorie malnutrition: Secondary | ICD-10-CM | POA: Diagnosis not present

## 2023-02-01 DIAGNOSIS — I2699 Other pulmonary embolism without acute cor pulmonale: Secondary | ICD-10-CM | POA: Diagnosis not present

## 2023-02-01 DIAGNOSIS — R293 Abnormal posture: Secondary | ICD-10-CM | POA: Diagnosis not present

## 2023-02-01 DIAGNOSIS — Z9181 History of falling: Secondary | ICD-10-CM | POA: Diagnosis not present

## 2023-02-01 DIAGNOSIS — R278 Other lack of coordination: Secondary | ICD-10-CM | POA: Diagnosis not present

## 2023-02-01 DIAGNOSIS — S0101XA Laceration without foreign body of scalp, initial encounter: Secondary | ICD-10-CM | POA: Diagnosis not present

## 2023-02-01 DIAGNOSIS — I82409 Acute embolism and thrombosis of unspecified deep veins of unspecified lower extremity: Secondary | ICD-10-CM | POA: Diagnosis not present

## 2023-02-01 DIAGNOSIS — J188 Other pneumonia, unspecified organism: Secondary | ICD-10-CM | POA: Diagnosis not present

## 2023-02-01 DIAGNOSIS — M6281 Muscle weakness (generalized): Secondary | ICD-10-CM | POA: Diagnosis not present

## 2023-02-01 DIAGNOSIS — R2681 Unsteadiness on feet: Secondary | ICD-10-CM | POA: Diagnosis not present

## 2023-02-02 DIAGNOSIS — I2699 Other pulmonary embolism without acute cor pulmonale: Secondary | ICD-10-CM | POA: Diagnosis not present

## 2023-02-02 DIAGNOSIS — M6281 Muscle weakness (generalized): Secondary | ICD-10-CM | POA: Diagnosis not present

## 2023-02-02 DIAGNOSIS — Z9181 History of falling: Secondary | ICD-10-CM | POA: Diagnosis not present

## 2023-02-02 DIAGNOSIS — R278 Other lack of coordination: Secondary | ICD-10-CM | POA: Diagnosis not present

## 2023-02-02 DIAGNOSIS — J188 Other pneumonia, unspecified organism: Secondary | ICD-10-CM | POA: Diagnosis not present

## 2023-02-02 DIAGNOSIS — I82409 Acute embolism and thrombosis of unspecified deep veins of unspecified lower extremity: Secondary | ICD-10-CM | POA: Diagnosis not present

## 2023-02-02 DIAGNOSIS — R293 Abnormal posture: Secondary | ICD-10-CM | POA: Diagnosis not present

## 2023-02-02 DIAGNOSIS — S0101XA Laceration without foreign body of scalp, initial encounter: Secondary | ICD-10-CM | POA: Diagnosis not present

## 2023-02-02 DIAGNOSIS — R2681 Unsteadiness on feet: Secondary | ICD-10-CM | POA: Diagnosis not present

## 2023-02-18 DIAGNOSIS — R4189 Other symptoms and signs involving cognitive functions and awareness: Secondary | ICD-10-CM | POA: Diagnosis not present

## 2023-02-18 DIAGNOSIS — I82401 Acute embolism and thrombosis of unspecified deep veins of right lower extremity: Secondary | ICD-10-CM | POA: Diagnosis not present

## 2023-02-18 DIAGNOSIS — I10 Essential (primary) hypertension: Secondary | ICD-10-CM | POA: Diagnosis not present

## 2023-02-18 DIAGNOSIS — R600 Localized edema: Secondary | ICD-10-CM | POA: Diagnosis not present

## 2023-02-23 DIAGNOSIS — J479 Bronchiectasis, uncomplicated: Secondary | ICD-10-CM | POA: Diagnosis not present

## 2023-02-25 DIAGNOSIS — R600 Localized edema: Secondary | ICD-10-CM | POA: Diagnosis not present

## 2023-02-25 DIAGNOSIS — I82401 Acute embolism and thrombosis of unspecified deep veins of right lower extremity: Secondary | ICD-10-CM | POA: Diagnosis not present

## 2023-02-26 DIAGNOSIS — F0390 Unspecified dementia without behavioral disturbance: Secondary | ICD-10-CM | POA: Diagnosis not present

## 2023-02-26 DIAGNOSIS — R269 Unspecified abnormalities of gait and mobility: Secondary | ICD-10-CM | POA: Diagnosis not present

## 2023-02-26 DIAGNOSIS — I2699 Other pulmonary embolism without acute cor pulmonale: Secondary | ICD-10-CM | POA: Diagnosis not present

## 2023-02-26 DIAGNOSIS — R600 Localized edema: Secondary | ICD-10-CM | POA: Diagnosis not present

## 2023-02-26 DIAGNOSIS — E46 Unspecified protein-calorie malnutrition: Secondary | ICD-10-CM | POA: Diagnosis not present

## 2023-02-26 DIAGNOSIS — R4189 Other symptoms and signs involving cognitive functions and awareness: Secondary | ICD-10-CM | POA: Diagnosis not present

## 2023-02-26 DIAGNOSIS — I10 Essential (primary) hypertension: Secondary | ICD-10-CM | POA: Diagnosis not present

## 2023-03-04 DIAGNOSIS — R600 Localized edema: Secondary | ICD-10-CM | POA: Diagnosis not present

## 2023-03-04 DIAGNOSIS — I10 Essential (primary) hypertension: Secondary | ICD-10-CM | POA: Diagnosis not present

## 2023-03-04 DIAGNOSIS — I82401 Acute embolism and thrombosis of unspecified deep veins of right lower extremity: Secondary | ICD-10-CM | POA: Diagnosis not present

## 2023-03-05 ENCOUNTER — Other Ambulatory Visit (HOSPITAL_COMMUNITY): Payer: Self-pay | Admitting: Family Medicine

## 2023-03-05 ENCOUNTER — Ambulatory Visit (HOSPITAL_COMMUNITY)
Admission: RE | Admit: 2023-03-05 | Discharge: 2023-03-05 | Disposition: A | Discharge status: Home / Self Care | Payer: Medicare PPO | Source: Ambulatory Visit | Attending: Vascular Surgery | Admitting: Vascular Surgery

## 2023-03-05 DIAGNOSIS — R600 Localized edema: Secondary | ICD-10-CM

## 2023-03-05 DIAGNOSIS — I824Y1 Acute embolism and thrombosis of unspecified deep veins of right proximal lower extremity: Secondary | ICD-10-CM

## 2023-03-09 DIAGNOSIS — R600 Localized edema: Secondary | ICD-10-CM | POA: Diagnosis not present

## 2023-03-09 DIAGNOSIS — R4189 Other symptoms and signs involving cognitive functions and awareness: Secondary | ICD-10-CM | POA: Diagnosis not present

## 2023-03-09 DIAGNOSIS — I10 Essential (primary) hypertension: Secondary | ICD-10-CM | POA: Diagnosis not present

## 2023-03-09 DIAGNOSIS — I82401 Acute embolism and thrombosis of unspecified deep veins of right lower extremity: Secondary | ICD-10-CM | POA: Diagnosis not present

## 2023-03-15 ENCOUNTER — Encounter (HOSPITAL_COMMUNITY): Payer: Self-pay

## 2023-03-15 ENCOUNTER — Other Ambulatory Visit: Payer: Self-pay

## 2023-03-15 ENCOUNTER — Emergency Department (HOSPITAL_COMMUNITY): Payer: Medicare PPO

## 2023-03-15 ENCOUNTER — Inpatient Hospital Stay (HOSPITAL_COMMUNITY)
Admission: EM | Admit: 2023-03-15 | Discharge: 2023-03-21 | DRG: 481 | Disposition: A | Discharge status: Skilled Nursing Facility | Payer: Medicare PPO | Source: Skilled Nursing Facility | Attending: Internal Medicine | Admitting: Internal Medicine

## 2023-03-15 DIAGNOSIS — Z86718 Personal history of other venous thrombosis and embolism: Secondary | ICD-10-CM | POA: Diagnosis not present

## 2023-03-15 DIAGNOSIS — F0390 Unspecified dementia without behavioral disturbance: Secondary | ICD-10-CM | POA: Diagnosis present

## 2023-03-15 DIAGNOSIS — D649 Anemia, unspecified: Secondary | ICD-10-CM | POA: Diagnosis present

## 2023-03-15 DIAGNOSIS — S72332A Displaced oblique fracture of shaft of left femur, initial encounter for closed fracture: Secondary | ICD-10-CM | POA: Diagnosis not present

## 2023-03-15 DIAGNOSIS — R748 Abnormal levels of other serum enzymes: Secondary | ICD-10-CM | POA: Diagnosis not present

## 2023-03-15 DIAGNOSIS — E441 Mild protein-calorie malnutrition: Secondary | ICD-10-CM | POA: Diagnosis present

## 2023-03-15 DIAGNOSIS — S72352A Displaced comminuted fracture of shaft of left femur, initial encounter for closed fracture: Secondary | ICD-10-CM | POA: Diagnosis not present

## 2023-03-15 DIAGNOSIS — D72829 Elevated white blood cell count, unspecified: Secondary | ICD-10-CM | POA: Diagnosis present

## 2023-03-15 DIAGNOSIS — I959 Hypotension, unspecified: Secondary | ICD-10-CM | POA: Diagnosis not present

## 2023-03-15 DIAGNOSIS — I10 Essential (primary) hypertension: Secondary | ICD-10-CM | POA: Diagnosis present

## 2023-03-15 DIAGNOSIS — H409 Unspecified glaucoma: Secondary | ICD-10-CM | POA: Diagnosis present

## 2023-03-15 DIAGNOSIS — S72302A Unspecified fracture of shaft of left femur, initial encounter for closed fracture: Principal | ICD-10-CM | POA: Diagnosis present

## 2023-03-15 DIAGNOSIS — R41841 Cognitive communication deficit: Secondary | ICD-10-CM | POA: Diagnosis not present

## 2023-03-15 DIAGNOSIS — R0902 Hypoxemia: Secondary | ICD-10-CM | POA: Diagnosis not present

## 2023-03-15 DIAGNOSIS — S0990XA Unspecified injury of head, initial encounter: Secondary | ICD-10-CM | POA: Diagnosis not present

## 2023-03-15 DIAGNOSIS — Z79899 Other long term (current) drug therapy: Secondary | ICD-10-CM | POA: Diagnosis not present

## 2023-03-15 DIAGNOSIS — Z8249 Family history of ischemic heart disease and other diseases of the circulatory system: Secondary | ICD-10-CM | POA: Diagnosis not present

## 2023-03-15 DIAGNOSIS — Z888 Allergy status to other drugs, medicaments and biological substances status: Secondary | ICD-10-CM | POA: Diagnosis not present

## 2023-03-15 DIAGNOSIS — D538 Other specified nutritional anemias: Secondary | ICD-10-CM | POA: Diagnosis not present

## 2023-03-15 DIAGNOSIS — Z820 Family history of epilepsy and other diseases of the nervous system: Secondary | ICD-10-CM | POA: Diagnosis not present

## 2023-03-15 DIAGNOSIS — D72823 Leukemoid reaction: Secondary | ICD-10-CM | POA: Diagnosis not present

## 2023-03-15 DIAGNOSIS — J479 Bronchiectasis, uncomplicated: Secondary | ICD-10-CM | POA: Diagnosis present

## 2023-03-15 DIAGNOSIS — M25572 Pain in left ankle and joints of left foot: Secondary | ICD-10-CM | POA: Diagnosis not present

## 2023-03-15 DIAGNOSIS — Z66 Do not resuscitate: Secondary | ICD-10-CM | POA: Diagnosis present

## 2023-03-15 DIAGNOSIS — S79929A Unspecified injury of unspecified thigh, initial encounter: Secondary | ICD-10-CM | POA: Diagnosis not present

## 2023-03-15 DIAGNOSIS — S32512A Fracture of superior rim of left pubis, initial encounter for closed fracture: Secondary | ICD-10-CM | POA: Diagnosis not present

## 2023-03-15 DIAGNOSIS — Z741 Need for assistance with personal care: Secondary | ICD-10-CM | POA: Diagnosis not present

## 2023-03-15 DIAGNOSIS — R2689 Other abnormalities of gait and mobility: Secondary | ICD-10-CM | POA: Diagnosis not present

## 2023-03-15 DIAGNOSIS — W050XXA Fall from non-moving wheelchair, initial encounter: Secondary | ICD-10-CM | POA: Diagnosis present

## 2023-03-15 DIAGNOSIS — M25562 Pain in left knee: Secondary | ICD-10-CM | POA: Diagnosis not present

## 2023-03-15 DIAGNOSIS — Z681 Body mass index (BMI) 19 or less, adult: Secondary | ICD-10-CM

## 2023-03-15 DIAGNOSIS — S728X2A Other fracture of left femur, initial encounter for closed fracture: Secondary | ICD-10-CM | POA: Diagnosis not present

## 2023-03-15 DIAGNOSIS — H4089 Other specified glaucoma: Secondary | ICD-10-CM | POA: Diagnosis not present

## 2023-03-15 DIAGNOSIS — E876 Hypokalemia: Secondary | ICD-10-CM | POA: Diagnosis present

## 2023-03-15 DIAGNOSIS — S79919A Unspecified injury of unspecified hip, initial encounter: Secondary | ICD-10-CM | POA: Diagnosis not present

## 2023-03-15 DIAGNOSIS — M6281 Muscle weakness (generalized): Secondary | ICD-10-CM | POA: Diagnosis not present

## 2023-03-15 DIAGNOSIS — Z7401 Bed confinement status: Secondary | ICD-10-CM | POA: Diagnosis not present

## 2023-03-15 DIAGNOSIS — R293 Abnormal posture: Secondary | ICD-10-CM | POA: Diagnosis not present

## 2023-03-15 DIAGNOSIS — S7292XS Unspecified fracture of left femur, sequela: Secondary | ICD-10-CM | POA: Diagnosis not present

## 2023-03-15 DIAGNOSIS — M979XXA Periprosthetic fracture around unspecified internal prosthetic joint, initial encounter: Secondary | ICD-10-CM | POA: Diagnosis present

## 2023-03-15 DIAGNOSIS — K7460 Unspecified cirrhosis of liver: Secondary | ICD-10-CM | POA: Diagnosis present

## 2023-03-15 DIAGNOSIS — J188 Other pneumonia, unspecified organism: Secondary | ICD-10-CM | POA: Diagnosis not present

## 2023-03-15 DIAGNOSIS — S7292XD Unspecified fracture of left femur, subsequent encounter for closed fracture with routine healing: Secondary | ICD-10-CM | POA: Diagnosis not present

## 2023-03-15 DIAGNOSIS — Z7901 Long term (current) use of anticoagulants: Secondary | ICD-10-CM | POA: Diagnosis not present

## 2023-03-15 DIAGNOSIS — S7292XA Unspecified fracture of left femur, initial encounter for closed fracture: Secondary | ICD-10-CM | POA: Diagnosis present

## 2023-03-15 DIAGNOSIS — I82409 Acute embolism and thrombosis of unspecified deep veins of unspecified lower extremity: Secondary | ICD-10-CM | POA: Diagnosis not present

## 2023-03-15 DIAGNOSIS — W19XXXA Unspecified fall, initial encounter: Secondary | ICD-10-CM | POA: Diagnosis not present

## 2023-03-15 DIAGNOSIS — M9702XA Periprosthetic fracture around internal prosthetic left hip joint, initial encounter: Secondary | ICD-10-CM | POA: Diagnosis not present

## 2023-03-15 DIAGNOSIS — F0150 Vascular dementia without behavioral disturbance: Secondary | ICD-10-CM | POA: Diagnosis not present

## 2023-03-15 DIAGNOSIS — Z471 Aftercare following joint replacement surgery: Secondary | ICD-10-CM | POA: Diagnosis not present

## 2023-03-15 DIAGNOSIS — M25559 Pain in unspecified hip: Secondary | ICD-10-CM | POA: Diagnosis not present

## 2023-03-15 DIAGNOSIS — M79662 Pain in left lower leg: Secondary | ICD-10-CM | POA: Diagnosis not present

## 2023-03-15 DIAGNOSIS — Z96642 Presence of left artificial hip joint: Secondary | ICD-10-CM | POA: Diagnosis not present

## 2023-03-15 DIAGNOSIS — F03A0 Unspecified dementia, mild, without behavioral disturbance, psychotic disturbance, mood disturbance, and anxiety: Secondary | ICD-10-CM | POA: Diagnosis not present

## 2023-03-15 DIAGNOSIS — Z86711 Personal history of pulmonary embolism: Secondary | ICD-10-CM

## 2023-03-15 DIAGNOSIS — S72402A Unspecified fracture of lower end of left femur, initial encounter for closed fracture: Secondary | ICD-10-CM | POA: Diagnosis not present

## 2023-03-15 DIAGNOSIS — K838 Other specified diseases of biliary tract: Secondary | ICD-10-CM | POA: Diagnosis not present

## 2023-03-15 LAB — CBC WITH DIFFERENTIAL/PLATELET
Abs Immature Granulocytes: 0.09 10*3/uL — ABNORMAL HIGH (ref 0.00–0.07)
Basophils Absolute: 0 10*3/uL (ref 0.0–0.1)
Basophils Relative: 0 %
Eosinophils Absolute: 0.1 10*3/uL (ref 0.0–0.5)
Eosinophils Relative: 1 %
HCT: 34.7 % — ABNORMAL LOW (ref 36.0–46.0)
Hemoglobin: 10.9 g/dL — ABNORMAL LOW (ref 12.0–15.0)
Immature Granulocytes: 1 %
Lymphocytes Relative: 12 %
Lymphs Abs: 1.5 10*3/uL (ref 0.7–4.0)
MCH: 27.6 pg (ref 26.0–34.0)
MCHC: 31.4 g/dL (ref 30.0–36.0)
MCV: 87.8 fL (ref 80.0–100.0)
Monocytes Absolute: 0.8 10*3/uL (ref 0.1–1.0)
Monocytes Relative: 6 %
Neutro Abs: 9.8 10*3/uL — ABNORMAL HIGH (ref 1.7–7.7)
Neutrophils Relative %: 80 %
Platelets: 269 10*3/uL (ref 150–400)
RBC: 3.95 MIL/uL (ref 3.87–5.11)
RDW: 14.4 % (ref 11.5–15.5)
WBC: 12.3 10*3/uL — ABNORMAL HIGH (ref 4.0–10.5)
nRBC: 0 % (ref 0.0–0.2)

## 2023-03-15 LAB — BASIC METABOLIC PANEL
Anion gap: 9 (ref 5–15)
BUN: 25 mg/dL — ABNORMAL HIGH (ref 8–23)
CO2: 24 mmol/L (ref 22–32)
Calcium: 8.7 mg/dL — ABNORMAL LOW (ref 8.9–10.3)
Chloride: 102 mmol/L (ref 98–111)
Creatinine, Ser: 0.9 mg/dL (ref 0.44–1.00)
GFR, Estimated: >60 mL/min (ref >60)
Glucose, Bld: 133 mg/dL — ABNORMAL HIGH (ref 70–99)
Potassium: 3.4 mmol/L — ABNORMAL LOW (ref 3.5–5.1)
Sodium: 135 mmol/L (ref 135–145)

## 2023-03-15 MED ORDER — NALOXONE HCL 0.4 MG/ML IJ SOLN: 0.4000 mg | INTRAMUSCULAR | Status: DC | PRN

## 2023-03-15 MED ORDER — POTASSIUM CHLORIDE CRYS ER 20 MEQ PO TBCR
40.0000 meq | EXTENDED_RELEASE_TABLET | Freq: Once | ORAL | Status: AC
  Administered 2023-03-16: 40 meq via ORAL
  Filled 2023-03-15: qty 2

## 2023-03-15 MED ORDER — ACETAMINOPHEN 325 MG PO TABS
650.0000 mg | ORAL_TABLET | Freq: Four times a day (QID) | ORAL | Status: DC | PRN
  Administered 2023-03-16 – 2023-03-18 (×5): 650 mg via ORAL
  Filled 2023-03-15 (×5): qty 2

## 2023-03-15 MED ORDER — MORPHINE SULFATE (PF) 2 MG/ML IV SOLN
1.0000 mg | INTRAVENOUS | Status: DC | PRN
  Administered 2023-03-16 – 2023-03-17 (×3): 1 mg via INTRAVENOUS
  Filled 2023-03-15 (×3): qty 1

## 2023-03-15 MED ORDER — METHOCARBAMOL 1000 MG/10ML IJ SOLN: 500.0000 mg | Freq: Four times a day (QID) | INTRAVENOUS | Status: DC | PRN

## 2023-03-15 MED ORDER — METHOCARBAMOL 500 MG PO TABS
500.0000 mg | ORAL_TABLET | Freq: Four times a day (QID) | ORAL | Status: DC | PRN
  Administered 2023-03-16 – 2023-03-18 (×5): 500 mg via ORAL
  Filled 2023-03-15 (×5): qty 1

## 2023-03-15 MED ORDER — SODIUM CHLORIDE 0.9 % IV SOLN: INTRAVENOUS | Status: AC

## 2023-03-15 NOTE — Progress Notes (Signed)
Orthopedic Tech Progress Note Patient Details:  Julia Hampton 11/20/1929 [id number]  Ortho Devices Type of Ortho Device: Knee Immobilizer Ortho Device/Splint Location: lle Ortho Device/Splint Interventions: Ordered, Application, Adjustment  An ed tech and rn assisted with holding the leg while I applied the splint. Post Interventions Patient Tolerated: Well Instructions Provided: Care of device, Adjustment of device  Trinna Post 03/15/2023, 10:50 PM

## 2023-03-15 NOTE — ED Triage Notes (Addendum)
Pt bib EMS from [facility] rehab for a confirmed left femoral fracture after a fall. Pt was attempting to shut door and another resident bumped into her causing her to fall. Denies LOC or hitting head. 22G R forearm 75 mcg of fentanyl given per EMS. Pt is alert and oriented x4. BG 130

## 2023-03-15 NOTE — ED Notes (Signed)
ED TO INPATIENT HANDOFF REPORT  Name/Age/Gender Julia Hampton 89 y.o. female  Code Status    Code Status Orders  (From admission, onward)           Start     Ordered   03/15/23 2209  Do not attempt resuscitation (DNR)  Continuous       Question Answer Comment  If patient has no pulse and is not breathing Do Not Attempt Resuscitation   If patient has a pulse and/or is breathing: Medical Treatment Goals LIMITED ADDITIONAL INTERVENTIONS: Use medication/IV fluids and cardiac monitoring as indicated; Do not use intubation or mechanical ventilation (DNI), also provide comfort medications.  Transfer to Progressive/Stepdown as indicated, avoid Intensive Care.   Consent: Discussion documented in EHR or advanced directives reviewed      03/15/23 2208           Code Status History     Date Active Date Inactive Code Status Order ID Comments User Context   10/24/2022 0856 10/30/2022 1911 DNR [id number]  Teddy Spike, DO ED   01/14/2017 1537 01/17/2017 1441 Full Code [id number]  Marinda Elk, MD Inpatient   09/28/2013 2130 10/02/2013 1604 Full Code [id number]  Javier Docker, MD Inpatient   09/28/2013 1533 09/28/2013 2130 Full Code [id number]  Dorothea Ogle, MD ED       Home/SNF/Other Skilled nursing facility  Chief Complaint Femur fracture, left [facility]) [S72.92XA]  Level of Care/Admitting Diagnosis ED Disposition     ED Disposition  Admit   Condition  --   Comment  Hospital Area: [facility] [[id number]]  Level of Care: Telemetry [5]  Admit to tele based on following criteria: Complex arrhythmia (Bradycardia/Tachycardia)  May place patient in observation at [facility] or [facility] if equivalent level of care is available:: Yes  Covid Evaluation: Asymptomatic - no recent exposure (last 10 days) testing not required  Diagnosis: Femur fracture, left [facility]) [[id number]]  Admitting Physician: John Giovanni [[id number]]  Attending Physician: John Giovanni [[id number]]          Medical History Past Medical History:  Diagnosis Date   Complication of anesthesia    trouble waking patient up   Glaucoma     Allergies Allergies  Allergen Reactions   Other Other (See Comments)    Glaucoma drop with a dark green top - not sure of name -    IV Location/Drains/Wounds Patient Lines/Drains/Airways Status     Active Line/Drains/Airways     Name Placement date Placement time Site Days   Peripheral IV 03/15/23 22 G 1" Anterior;Right Forearm 03/15/23  2047  Forearm  less than 1   Wound / Incision (Open or Dehisced) 10/24/22 Laceration Head Posterior 3 staples 10/24/22  1600  Head  142            Labs/Imaging Results for orders placed or performed during the hospital encounter of 03/15/23 (from the past 48 hour(s))  CBC with Differential     Status: Abnormal   Collection Time: 03/15/23  9:01 PM  Result Value Ref Range   WBC 12.3 (H) 4.0 - 10.5 K/uL   RBC 3.95 3.87 - 5.11 MIL/uL   Hemoglobin 10.9 (L) 12.0 - 15.0 g/dL   HCT 53.6 (L) 64.4 - 03.4 %   MCV 87.8 80.0 - 100.0 fL   MCH 27.6 26.0 - 34.0 pg   MCHC 31.4 30.0 - 36.0 g/dL   RDW 74.2 59.5 - 63.8 %   Platelets 269 150 -  400 K/uL   nRBC 0.0 0.0 - 0.2 %   Neutrophils Relative % 80 %   Neutro Abs 9.8 (H) 1.7 - 7.7 K/uL   Lymphocytes Relative 12 %   Lymphs Abs 1.5 0.7 - 4.0 K/uL   Monocytes Relative 6 %   Monocytes Absolute 0.8 0.1 - 1.0 K/uL   Eosinophils Relative 1 %   Eosinophils Absolute 0.1 0.0 - 0.5 K/uL   Basophils Relative 0 %   Basophils Absolute 0.0 0.0 - 0.1 K/uL   Immature Granulocytes 1 %   Abs Immature Granulocytes 0.09 (H) 0.00 - 0.07 K/uL    Comment: Performed at [facility], [address], [place], [place] [address]  Basic metabolic panel     Status: Abnormal   Collection Time: 03/15/23  9:01 PM  Result Value Ref Range   Sodium 135 135 - 145 mmol/L   Potassium 3.4 (L) 3.5 - 5.1 mmol/L   Chloride 102 98 - 111 mmol/L   CO2 24  22 - 32 mmol/L   Glucose, Bld 133 (H) 70 - 99 mg/dL    Comment: Glucose reference range applies only to samples taken after fasting for at least 8 hours.   BUN 25 (H) 8 - 23 mg/dL   Creatinine, Ser 4.01 0.44 - 1.00 mg/dL   Calcium 8.7 (L) 8.9 - 10.3 mg/dL   GFR, Estimated >02 >72 mL/min    Comment: (NOTE) Calculated using the CKD-EPI Creatinine Equation (2021)    Anion gap 9 5 - 15    Comment: Performed at [facility], [address], [place], [place] [address]   CT Head Wo Contrast  Result Date: 03/15/2023 CLINICAL DATA:  Polytrauma, blunt EXAM: CT HEAD WITHOUT CONTRAST TECHNIQUE: Contiguous axial images were obtained from the base of the skull through the vertex without intravenous contrast. RADIATION DOSE REDUCTION: This exam was performed according to the departmental dose-optimization program which includes automated exposure control, adjustment of the mA and/or kV according to patient size and/or use of iterative reconstruction technique. COMPARISON:  CT head 11/02/2022 FINDINGS: Brain: Cerebral ventricle sizes are concordant with the degree of cerebral volume loss. Trace patchy and confluent areas of decreased attenuation are noted throughout the deep and periventricular white matter of the cerebral hemispheres bilaterally, compatible with chronic microvascular ischemic disease. No evidence of large-territorial acute infarction. No parenchymal hemorrhage. No mass lesion. No extra-axial collection. No mass effect or midline shift. No hydrocephalus. Basilar cisterns are patent. Vascular: No hyperdense vessel. Skull: No acute fracture or focal lesion. Sinuses/Orbits: Paranasal sinuses and mastoid air cells are clear. Bilateral lens replacement. Otherwise the orbits are unremarkable. Other: None. IMPRESSION: No acute intracranial abnormality. Electronically Signed   By: Tish Frederickson M.D.   On: 03/15/2023 21:38   DG Femur Min 2 Views Left  Result Date: 03/15/2023 CLINICAL  DATA:  Left femur fracture after fall. EXAM: LEFT FEMUR 2 VIEWS COMPARISON:  None Available. FINDINGS: Oblique femoral shaft fracture. Fracture is distal to the femoral stem of hip arthroplasty. There is no convincing periprosthetic component. Osseous overriding of 4.7 cm with mild apex lateral angulation. The bones are under mineralized. Knee alignment is maintained. IMPRESSION: Oblique mildly displaced and angulated left femoral shaft fracture distal to the femoral stem of hip arthroplasty. Osseous overriding of 4.7 cm with mild apex lateral angulation. Electronically Signed   By: Narda Rutherford M.D.   On: 03/15/2023 21:30   DG Hip Unilat With Pelvis 2-3 Views Left  Result Date: 03/15/2023  CLINICAL DATA:  Hip fracture. Left femur fracture after fall. EXAM: DG HIP (WITH OR WITHOUT PELVIS) 2-3V LEFT COMPARISON:  None Available. FINDINGS: The femoral shaft fracture is appreciated on concurrent femur exam. Left hip arthroplasty is intact. The bony pelvis is intact, no pubic ramus fracture. Pubic symphysis and sacroiliac joints are congruent. IMPRESSION: 1. Left femoral shaft fracture is appreciated on concurrent femur exam. 2. Intact left hip arthroplasty.  No pelvic fracture. Electronically Signed   By: Narda Rutherford M.D.   On: 03/15/2023 21:28    Pending Labs Unresulted Labs (From admission, onward)    None       Vitals/Pain Today's Vitals   03/15/23 2044 03/15/23 2046 03/15/23 2049  BP:   134/75  Pulse:   70  Resp:   16  Temp:  98.2 F (36.8 C)   TempSrc:  Oral   SpO2:   97%  PainSc: 3       Isolation Precautions No active isolations  Medications Medications  morphine (PF) 2 MG/ML injection 1 mg (has no administration in time range)  naloxone (NARCAN) injection 0.4 mg (has no administration in time range)    Mobility non-ambulatory

## 2023-03-15 NOTE — H&P (Signed)
History and Physical    Julia Hampton ZOX:[id number] DOB: [birth year] DOA: 03/15/2023  PCP: Soundra Pilon, FNP  Patient coming from: ALF  Chief Complaint: Femur fracture  HPI: Julia Hampton is a 89 y.o. 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  X-rays were done at the facility which showed hip fracture and she was sent to the ED.  History of previous left hip arthroplasty.  EMS gave her fentanyl en route.  In the ED, patient's vital signs are stable.  Labs significant for WBC 12.3, hemoglobin 10.9 (stable), potassium 3.4.  X-ray showing oblique mildly displaced and angulated left femoral shaft fracture distal to the femoral stem of hip arthroplasty.  CT head negative for acute intracranial abnormality.  [facility] consulted by ED physician (Dr. Shon Baton) and knee immobilizer ordered.  [facility] called to admit.   Patient states she resides at an assisted living facility where another resident was trying to get inside her room and as she was trying to push her door to get them away, she lost her balance and fell landing on her left hip and since then having severe pain.  Denies head injury or loss of consciousness.  Daughter at bedside states patient was on Eliquis due to history of PE and finished treatment 3 weeks ago.  Patient has no other complaints.  Denies neck pain, back pain, or any other injuries from the fall.  Denies fevers, cough, shortness breath, chest pain, nausea, vomiting, or abdominal pain.  Review of Systems:  Review of Systems  All other systems reviewed and are negative.   Past Medical History:  Diagnosis Date   Complication of anesthesia    trouble waking patient up   Glaucoma     Past Surgical History:  Procedure Laterality Date   ABDOMINAL  HYSTERECTOMY     CHOLECYSTECTOMY     HIP ARTHROPLASTY Left 09/28/2013   Procedure: ARTHROPLASTY BIPOLAR HIP;  Surgeon: Javier Docker, MD;  Location: [facility];  Service: Orthopedics;  Laterality: Left;   MASTOIDECTOMY       reports that she has never smoked. She has never used smokeless tobacco. She reports that she does not drink alcohol and does not use drugs.  Allergies  Allergen Reactions   Other Other (See Comments)    Glaucoma drop with a dark green top - not sure of name -    Family History  Problem Relation Age of Onset   Heart failure Mother    Parkinson's disease Father     Prior to Admission medications   Medication Sig Start Date End Date Taking? Authorizing Provider  acetaminophen (TYLENOL) 500 MG tablet Take 500 mg by mouth every 6 (six) hours as needed for mild pain or moderate pain.    [provider]  albuterol (PROVENTIL) (2.5 MG/3ML) 0.083% nebulizer solution Take 3 mLs (2.5 mg total) by nebulization every 6 (six) hours as needed for wheezing or shortness of breath. 01/19/23   Omar Person, MD  amLODipine (NORVASC) 5 MG tablet Take 5 mg by mouth daily. 09/04/22   [provider]  apixaban (ELIQUIS) 5 MG TABS tablet Take 1 tablet (5 mg total) by mouth 2 (two) times daily. 11/02/22   Danford, Earl Lites, MD  bismuth subsalicylate (PEPTO-BISMOL) 262 MG/15ML suspension Take 30 mLs by mouth every 6 (six) hours  as needed. Patient taking differently: Take 30 mLs by mouth every 6 (six) hours as needed for indigestion. 01/22/17   Lyndal Pulley, MD  brimonidine (ALPHAGAN) 0.2 % ophthalmic solution Place 1 drop into the right eye 2 (two) times daily.    [provider]  citalopram (CELEXA) 20 MG tablet Take 20 mg by mouth daily. 10/23/22   [provider]  dorzolamide-timolol (COSOPT) 2-0.5 % ophthalmic solution Place 1 drop into both eyes 2 (two) times daily.    [provider]  guaiFENesin (MUCINEX) 600 MG 12 hr tablet Take 1  tablet (600 mg total) by mouth 2 (two) times daily as needed for to loosen phlegm. 01/19/23   Omar Person, MD  latanoprost (XALATAN) 0.005 % ophthalmic solution Place 1 drop into the right eye at bedtime. 09/19/22   [provider]  mirtazapine (REMERON) 7.5 MG tablet Take 1 tablet (7.5 mg total) by mouth at bedtime. 10/30/22   Danford, Earl Lites, MD  pilocarpine (PILOCAR) 4 % ophthalmic solution Place 1 drop into the right eye 2 (two) times daily.    [provider]    Physical Exam: Vitals:   03/15/23 2046 03/15/23 2049  BP:  134/75  Pulse:  70  Resp:  16  Temp: 98.2 F (36.8 C)   TempSrc: Oral   SpO2:  97%    Physical Exam Vitals reviewed.  Constitutional:      General: She is not in acute distress. HENT:     Head: Normocephalic and atraumatic.  Eyes:     Extraocular Movements: Extraocular movements intact.  Cardiovascular:     Rate and Rhythm: Normal rate and regular rhythm.     Pulses: Normal pulses.  Pulmonary:     Effort: Pulmonary effort is normal. No respiratory distress.     Breath sounds: Normal breath sounds. No wheezing or rales.  Abdominal:     General: Bowel sounds are normal. There is no distension.     Palpations: Abdomen is soft.     Tenderness: There is no abdominal tenderness.  Musculoskeletal:     Cervical back: Normal range of motion.     Right lower leg: No edema.     Left lower leg: No edema.     Comments: Left lower extremity neurovascularly intact  Skin:    General: Skin is warm and dry.  Neurological:     General: No focal deficit present.     Mental Status: She is alert and oriented to person, place, and time.     Labs on Admission: I have personally reviewed following labs and imaging studies  CBC: Recent Labs  Lab 03/15/23 2101  WBC 12.3*  NEUTROABS 9.8*  HGB 10.9*  HCT 34.7*  MCV 87.8  PLT 269   Basic Metabolic Panel: Recent Labs  Lab 03/15/23 2101  NA 135  K 3.4*  CL 102  CO2 24  GLUCOSE  133*  BUN 25*  CREATININE 0.90  CALCIUM 8.7*   GFR: CrCl cannot be calculated (Unknown ideal weight.). Liver Function Tests: No results for input(s): "AST", "ALT", "ALKPHOS", "BILITOT", "PROT", "ALBUMIN" in the last 168 hours. No results for input(s): "LIPASE", "AMYLASE" in the last 168 hours. No results for input(s): "AMMONIA" in the last 168 hours. Coagulation Profile: No results for input(s): "INR", "PROTIME" in the last 168 hours. Cardiac Enzymes: No results for input(s): "CKTOTAL", "CKMB", "CKMBINDEX", "TROPONINI" in the last 168 hours. BNP (last 3 results) Recent Labs    12/11/22 1503  PROBNP 43.0  HbA1C: No results for input(s): "HGBA1C" in the last 72 hours. CBG: No results for input(s): "GLUCAP" in the last 168 hours. Lipid Profile: No results for input(s): "CHOL", "HDL", "LDLCALC", "TRIG", "CHOLHDL", "LDLDIRECT" in the last 72 hours. Thyroid Function Tests: No results for input(s): "TSH", "T4TOTAL", "FREET4", "T3FREE", "THYROIDAB" in the last 72 hours. Anemia Panel: No results for input(s): "VITAMINB12", "FOLATE", "FERRITIN", "TIBC", "IRON", "RETICCTPCT" in the last 72 hours. Urine analysis:    Component Value Date/Time   COLORURINE YELLOW 10/24/2022 0440   APPEARANCEUR CLEAR 10/24/2022 0440   LABSPEC 1.015 10/24/2022 0440   PHURINE 7.0 10/24/2022 0440   GLUCOSEU NEGATIVE 10/24/2022 0440   HGBUR MODERATE (A) 10/24/2022 0440   BILIRUBINUR NEGATIVE 10/24/2022 0440   KETONESUR NEGATIVE 10/24/2022 0440   PROTEINUR NEGATIVE 10/24/2022 0440   UROBILINOGEN 0.2 09/28/2013 1439   NITRITE NEGATIVE 10/24/2022 0440   LEUKOCYTESUR TRACE (A) 10/24/2022 0440    Radiological Exams on Admission: CT Head Wo Contrast  Result Date: 03/15/2023 CLINICAL DATA:  Polytrauma, blunt EXAM: CT HEAD WITHOUT CONTRAST TECHNIQUE: Contiguous axial images were obtained from the base of the skull through the vertex without intravenous contrast. RADIATION DOSE REDUCTION: This exam was  performed according to the departmental dose-optimization program which includes automated exposure control, adjustment of the mA and/or kV according to patient size and/or use of iterative reconstruction technique. COMPARISON:  CT head 11/02/2022 FINDINGS: Brain: Cerebral ventricle sizes are concordant with the degree of cerebral volume loss. Trace patchy and confluent areas of decreased attenuation are noted throughout the deep and periventricular white matter of the cerebral hemispheres bilaterally, compatible with chronic microvascular ischemic disease. No evidence of large-territorial acute infarction. No parenchymal hemorrhage. No mass lesion. No extra-axial collection. No mass effect or midline shift. No hydrocephalus. Basilar cisterns are patent. Vascular: No hyperdense vessel. Skull: No acute fracture or focal lesion. Sinuses/Orbits: Paranasal sinuses and mastoid air cells are clear. Bilateral lens replacement. Otherwise the orbits are unremarkable. Other: None. IMPRESSION: No acute intracranial abnormality. Electronically Signed   By: Tish Frederickson M.D.   On: 03/15/2023 21:38   DG Femur Min 2 Views Left  Result Date: 03/15/2023 CLINICAL DATA:  Left femur fracture after fall. EXAM: LEFT FEMUR 2 VIEWS COMPARISON:  None Available. FINDINGS: Oblique femoral shaft fracture. Fracture is distal to the femoral stem of hip arthroplasty. There is no convincing periprosthetic component. Osseous overriding of 4.7 cm with mild apex lateral angulation. The bones are under mineralized. Knee alignment is maintained. IMPRESSION: Oblique mildly displaced and angulated left femoral shaft fracture distal to the femoral stem of hip arthroplasty. Osseous overriding of 4.7 cm with mild apex lateral angulation. Electronically Signed   By: Narda Rutherford M.D.   On: 03/15/2023 21:30   DG Hip Unilat With Pelvis 2-3 Views Left  Result Date: 03/15/2023 CLINICAL DATA:  Hip fracture. Left femur fracture after fall. EXAM: DG  HIP (WITH OR WITHOUT PELVIS) 2-3V LEFT COMPARISON:  None Available. FINDINGS: The femoral shaft fracture is appreciated on concurrent femur exam. Left hip arthroplasty is intact. The bony pelvis is intact, no pubic ramus fracture. Pubic symphysis and sacroiliac joints are congruent. IMPRESSION: 1. Left femoral shaft fracture is appreciated on concurrent femur exam. 2. Intact left hip arthroplasty.  No pelvic fracture. Electronically Signed   By: Narda Rutherford M.D.   On: 03/15/2023 21:28    EKG: Pending at this time.   Assessment and Plan  Oblique mildly displaced and angulated left femoral shaft fracture distal to the femoral stem  of hip arthroplasty Secondary to a mechanical fall.  [facility] consulted by ED physician and knee immobilizer ordered.  Keep n.p.o. after midnight, gentle IV fluid hydration, and nonweightbearing.  Pain management: IV morphine as needed for severe pain, Robaxin as needed for muscle spasms, Tylenol as needed.  No longer on Eliquis, finished treatment 3 weeks ago.  Mild leukocytosis Likely reactive.  No infectious signs or symptoms.  Repeat CBC in a.m.  Chronic anemia Hemoglobin stable, continue to monitor labs.  Mild hypokalemia Replace potassium and continue to monitor labs.  Hypertension: Stable. Dementia: Delirium precautions. Nodular bronchiectasis, subclinical NTM infection: Stable, followed by outpatient pulmonology. Glaucoma Pharmacy med rec pending.  DVT prophylaxis: SCDs Code Status: DNR/DNI (discussed with the patient and her daughter) Family Communication: Daughter at bedside. Level of care: Telemetry bed Admission status: It is my clinical opinion that referral for OBSERVATION is reasonable and necessary in this patient based on the above information provided. The aforementioned taken together are felt to place the patient at high risk for further clinical deterioration. However, it is anticipated that the patient may be medically stable  for discharge from the hospital within 24 to 48 hours.   John Giovanni MD [facility]  If 7PM-7AM, please contact night-coverage [email or web address]  03/15/2023, 10:09 PM
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
## 2023-03-16 ENCOUNTER — Observation Stay (HOSPITAL_COMMUNITY): Payer: Medicare PPO

## 2023-03-16 DIAGNOSIS — I10 Essential (primary) hypertension: Secondary | ICD-10-CM | POA: Diagnosis present

## 2023-03-16 DIAGNOSIS — D72829 Elevated white blood cell count, unspecified: Secondary | ICD-10-CM | POA: Diagnosis present

## 2023-03-16 DIAGNOSIS — E876 Hypokalemia: Secondary | ICD-10-CM | POA: Diagnosis present

## 2023-03-16 DIAGNOSIS — J479 Bronchiectasis, uncomplicated: Secondary | ICD-10-CM | POA: Diagnosis present

## 2023-03-16 DIAGNOSIS — M9702XA Periprosthetic fracture around internal prosthetic left hip joint, initial encounter: Secondary | ICD-10-CM | POA: Diagnosis not present

## 2023-03-16 DIAGNOSIS — Z888 Allergy status to other drugs, medicaments and biological substances status: Secondary | ICD-10-CM | POA: Diagnosis not present

## 2023-03-16 DIAGNOSIS — S7292XD Unspecified fracture of left femur, subsequent encounter for closed fracture with routine healing: Secondary | ICD-10-CM

## 2023-03-16 DIAGNOSIS — F0390 Unspecified dementia without behavioral disturbance: Secondary | ICD-10-CM | POA: Diagnosis present

## 2023-03-16 DIAGNOSIS — W050XXA Fall from non-moving wheelchair, initial encounter: Secondary | ICD-10-CM | POA: Diagnosis present

## 2023-03-16 DIAGNOSIS — H409 Unspecified glaucoma: Secondary | ICD-10-CM | POA: Diagnosis present

## 2023-03-16 DIAGNOSIS — Z7901 Long term (current) use of anticoagulants: Secondary | ICD-10-CM | POA: Diagnosis not present

## 2023-03-16 DIAGNOSIS — F0150 Vascular dementia without behavioral disturbance: Secondary | ICD-10-CM | POA: Diagnosis not present

## 2023-03-16 DIAGNOSIS — M979XXA Periprosthetic fracture around unspecified internal prosthetic joint, initial encounter: Secondary | ICD-10-CM | POA: Diagnosis present

## 2023-03-16 DIAGNOSIS — F03A0 Unspecified dementia, mild, without behavioral disturbance, psychotic disturbance, mood disturbance, and anxiety: Secondary | ICD-10-CM | POA: Diagnosis not present

## 2023-03-16 DIAGNOSIS — Z681 Body mass index (BMI) 19 or less, adult: Secondary | ICD-10-CM | POA: Diagnosis not present

## 2023-03-16 DIAGNOSIS — D649 Anemia, unspecified: Secondary | ICD-10-CM | POA: Diagnosis present

## 2023-03-16 DIAGNOSIS — Z820 Family history of epilepsy and other diseases of the nervous system: Secondary | ICD-10-CM | POA: Diagnosis not present

## 2023-03-16 DIAGNOSIS — Z86711 Personal history of pulmonary embolism: Secondary | ICD-10-CM | POA: Diagnosis not present

## 2023-03-16 DIAGNOSIS — Z8249 Family history of ischemic heart disease and other diseases of the circulatory system: Secondary | ICD-10-CM | POA: Diagnosis not present

## 2023-03-16 DIAGNOSIS — S32512A Fracture of superior rim of left pubis, initial encounter for closed fracture: Secondary | ICD-10-CM | POA: Diagnosis not present

## 2023-03-16 DIAGNOSIS — D538 Other specified nutritional anemias: Secondary | ICD-10-CM | POA: Diagnosis not present

## 2023-03-16 DIAGNOSIS — K7460 Unspecified cirrhosis of liver: Secondary | ICD-10-CM | POA: Diagnosis present

## 2023-03-16 DIAGNOSIS — Z86718 Personal history of other venous thrombosis and embolism: Secondary | ICD-10-CM | POA: Diagnosis not present

## 2023-03-16 DIAGNOSIS — S72302A Unspecified fracture of shaft of left femur, initial encounter for closed fracture: Secondary | ICD-10-CM | POA: Diagnosis present

## 2023-03-16 DIAGNOSIS — Z79899 Other long term (current) drug therapy: Secondary | ICD-10-CM | POA: Diagnosis not present

## 2023-03-16 DIAGNOSIS — S7292XA Unspecified fracture of left femur, initial encounter for closed fracture: Secondary | ICD-10-CM | POA: Diagnosis present

## 2023-03-16 DIAGNOSIS — Z66 Do not resuscitate: Secondary | ICD-10-CM | POA: Diagnosis present

## 2023-03-16 DIAGNOSIS — E441 Mild protein-calorie malnutrition: Secondary | ICD-10-CM | POA: Diagnosis present

## 2023-03-16 LAB — CBC
HCT: 36.1 % (ref 36.0–46.0)
Hemoglobin: 11.3 g/dL — ABNORMAL LOW (ref 12.0–15.0)
MCH: 28 pg (ref 26.0–34.0)
MCHC: 31.3 g/dL (ref 30.0–36.0)
MCV: 89.4 fL (ref 80.0–100.0)
Platelets: 259 K/uL (ref 150–400)
RBC: 4.04 MIL/uL (ref 3.87–5.11)
RDW: 14.2 % (ref 11.5–15.5)
WBC: 7.9 K/uL (ref 4.0–10.5)
nRBC: 0 % (ref 0.0–0.2)

## 2023-03-16 LAB — BASIC METABOLIC PANEL
Anion gap: 10 (ref 5–15)
BUN: 21 mg/dL (ref 8–23)
CO2: 22 mmol/L (ref 22–32)
Calcium: 8.6 mg/dL — ABNORMAL LOW (ref 8.9–10.3)
Chloride: 102 mmol/L (ref 98–111)
Creatinine, Ser: 0.77 mg/dL (ref 0.44–1.00)
GFR, Estimated: >60 mL/min (ref >60)
Glucose, Bld: 153 mg/dL — ABNORMAL HIGH (ref 70–99)
Potassium: 3.8 mmol/L (ref 3.5–5.1)
Sodium: 134 mmol/L — ABNORMAL LOW (ref 135–145)

## 2023-03-16 MED ORDER — ENSURE PRE-SURGERY PO LIQD
296.0000 mL | Freq: Once | ORAL | Status: AC
  Administered 2023-03-18: 296 mL via ORAL
  Filled 2023-03-16: qty 296

## 2023-03-16 MED ORDER — ADULT MULTIVITAMIN W/MINERALS CH
1.0000 | ORAL_TABLET | Freq: Every day | ORAL | Status: DC
  Administered 2023-03-17 – 2023-03-21 (×4): 1 via ORAL
  Filled 2023-03-16 (×4): qty 1

## 2023-03-16 MED ORDER — ENSURE ENLIVE PO LIQD
237.0000 mL | Freq: Two times a day (BID) | ORAL | Status: DC
  Administered 2023-03-16 – 2023-03-20 (×7): 237 mL via ORAL

## 2023-03-16 MED ORDER — ENSURE PRE-SURGERY PO LIQD
296.0000 mL | Freq: Once | ORAL | Status: DC
  Filled 2023-03-16: qty 296

## 2023-03-16 MED ORDER — ORAL CARE MOUTH RINSE: 15.0000 mL | OROMUCOSAL | Status: DC | PRN

## 2023-03-16 NOTE — Progress Notes (Signed)
Initial Nutrition Assessment  DOCUMENTATION CODES:   Not applicable  INTERVENTION:  Ensure Plus High Protein po BID, each supplement provides 350 kcal and 20 grams of protein.  MVI  Encourage po intake   Education on adequate nutrition  NUTRITION DIAGNOSIS:   Inadequate oral intake related to poor appetite as evidenced by meal completion < 25%, per patient/family report, mild muscle depletion, moderate muscle depletion, mild fat depletion.   GOAL:   Patient will meet greater than or equal to 90% of their needs   MONITOR:   PO intake, Weight trends, Supplement acceptance, Skin, I & O's, Labs  REASON FOR ASSESSMENT:   Consult Assessment of nutrition requirement/status, Hip fracture protocol  ASSESSMENT:   89 y.o. 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 Patient's daughter was at bedside to help provide information. Patient normally eats 1-2 small meals per day (B/L- toast, vegetable, coffee; D- something light 2-3 hrs before bedtime; 2 ensures per day)   UBW reported to be ~100#. Patient reports she chews very well before she swallows and states that something's get hung in the back of her throat.   Patient did not eat any of her lunch.   Patient agreeable to ONS BID. RD encouraged good nutrition intake post-op for optimal healing.   Labs:  Na 134, Glu 153, alk phos 34 Meds: Ensure surgery, NS  Wt: gradual wt gain since Januray 2024     Wt Readings from Last 10 Encounters:  03/15/23 47.2 kg  01/19/23 45.2 kg  12/11/22 43.6 kg  11/02/22 38.6 kg  Po: No meals documented at this time,  I/O's: +552 ml    NUTRITION - FOCUSED PHYSICAL  EXAM:  Flowsheet Row Most Recent Value  Orbital Region Mild depletion  Upper Arm Region No depletion  Thoracic and Lumbar Region Unable to assess  Buccal Region No depletion  Temple Region Mild depletion  Clavicle Bone Region Moderate depletion  Clavicle and Acromion Bone Region Moderate depletion  Scapular Bone Region Unable to assess  Dorsal Hand Severe depletion  Patellar Region No depletion  Anterior Thigh Region No depletion  Posterior Calf Region Mild depletion  Edema (RD Assessment) None  Hair Reviewed  Eyes Reviewed  Mouth Reviewed  Skin Reviewed  Nails Reviewed       Diet Order:   Diet Order             Diet NPO time specified  Diet effective midnight           Diet regular Room service appropriate? Yes; Fluid consistency: Thin  Diet effective now                   EDUCATION NEEDS:   Education needs have been addressed  Skin:  Skin Assessment: Reviewed RN Assessment  Last BM:  5/27  Height:   Ht Readings from Last 1 Encounters:  03/15/23 5\' 1"  (1.549 m)    Weight:   Wt Readings from Last 1 Encounters:  03/15/23 47.2 kg    Ideal Body Weight:     BMI:  Body mass index is 19.66 kg/m.  Estimated Nutritional Needs:   Kcal:  1200-1450 kcal  Protein:  56-70 g  Fluid:  >1.4 L  Leodis Rains, RDN, LDN  Clinical Nutrition

## 2023-03-16 NOTE — Progress Notes (Addendum)
Asked by Dr. Shon Baton for assistance in managing left ppx femur fx. Imaging reviewed - x-rays show [place] C ppx left femur fx. Patient needs ORIF left femur. Tentatively plan for OR Thursday to allow DOAC washout. NPO after MN on Wednesday night. Hold Eliquis. I ordered a left femur CT for surgical planning. Type and screen ordered. Preop orders placed.   ADDENDUM: [facility] called to inform me that last DOAC dose was 3 weeks ago, as patient completed course for DVT/PE treatment (occurred January 2024). Appreciate update.

## 2023-03-16 NOTE — Progress Notes (Signed)
Progress Note   Patient: Julia Hampton ZOX:[id number] DOB: 11-Jul-1930 DOA: 03/15/2023     0 DOS: the patient was seen and examined on 03/16/2023   Brief hospital course:  SUNI AMEND is a 89 y.o. 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  Patient was evaluated by the orthopedic surgical planning for open reduction internal fixation on Thursday.  Waiting for DOAC washout per orthopedic surgeon, however patient's last dose was 3 weeks ago  Assessment and Plan:   Oblique mildly displaced and angulated left femoral shaft fracture distal to the femoral stem of hip arthroplasty Secondary to a mechanical fall.   Pain well-controlled at rest - [facility] evaluated the patient currently planning for open reduction internal fixation on Thursday. -Patient was on Eliquis for pulmonary embolism treatment and has completed the course 3 weeks ago.  The patient's daughter has confirmed the patient's last dose of Eliquis was 3 weeks ago.  - Continue knee immobilizer, pain management with IV morphine, Robaxin for muscle spasms.       Mild leukocytosis Likely reactive.  No infectious signs or symptoms.  Repeat CBC in a.m. shows resolution of leukocytosis most likely reactive   Chronic anemia Hemoglobin stable, continue to monitor labs.  Hemoglobin fairly stable.   Mild hypokalemia Replace potassium and continue to monitor labs.  Current potassium is 3.8.   Hypertension: Stable. Dementia: Delirium precautions. Nodular bronchiectasis, subclinical NTM infection: Stable, followed by outpatient pulmonology. Glaucoma Pharmacy med rec pending.   DVT prophylaxis: SCDs      Subjective: Patient seen and examined at bedside today.  Patient reports as long as she does not move she does  not experience much pain.  Physical Exam: Vitals:   03/15/23 2326 03/16/23 0528 03/16/23 0959 03/16/23 1332  BP: 136/71 (!) 143/73 (!) 149/68 124/63  Pulse: 68 60 69 76  Resp: 17 18 18 18   Temp: 97.7 F (36.5 C) 97.7 F (36.5 C) 98.3 F (36.8 C) 97.9 F (36.6 C)  TempSrc: Oral Oral Oral Oral  SpO2: 94% 97% 100% 93%  Weight:      Height:       Constitutional:      General: She is not in acute distress. HENT:     Head: Normocephalic and atraumatic.  Eyes:     Extraocular Movements: Extraocular movements intact.  Cardiovascular:     Rate and Rhythm: Normal rate and regular rhythm.     Pulses: Normal pulses.  Pulmonary:     Effort: Pulmonary effort is normal. No respiratory distress.     Breath sounds: Normal breath sounds. No wheezing or rales.  Abdominal:     General: Bowel sounds are normal. There is no distension.     Palpations: Abdomen is soft.     Tenderness: There is no abdominal tenderness.  Musculoskeletal:     Cervical back: Normal range of motion.     Right lower leg: No edema.     Left lower leg: No edema.     Comments: Left lower extremity neurovascularly intact  Skin:    General: Skin is warm and dry.  Neurological:     General: No focal deficit present.     Mental Status: She is alert and oriented to person, place, and time Data Reviewed:    Reviewed the patient's lab hemoglobin, electrolytes within normal  limits. Family Communication: Spoke to daughter at bedside  Disposition: Status is: Inpatient Remains inpatient appropriate because: Femur fracture  Planned Discharge Destination: Home with Home Health    Time spent: 40 minutes  Author: Harold Hedge, MD 03/16/2023 2:08 PM  For on call review [email or web address].
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
## 2023-03-16 NOTE — TOC Initial Note (Signed)
Transition of Care [facility]) - Initial/Assessment Note   Patient Details  Name: Julia Hampton MRN: [id number] Date of Birth: May 26, 1930  [facility]) CM/SW Contact:    Ewing Schlein, LCSW Phone Number: 03/16/2023, 9:50 AM  Clinical Narrative: Patient is a long-term care resident at [facility]. Surgery is scheduled for 03/18/23. FL2 started. TOC awaiting surgery and PT recommendations.                 Expected Discharge Plan: Skilled Nursing Facility Barriers to Discharge: Continued Medical Work up  Patient Goals and CMS Choice Patient states their goals for this hospitalization and ongoing recovery are:: Return to [employer] Medicare.gov Compare Post Acute Care list provided to:: Patient Choice offered to / list presented to : Patient  Expected Discharge Plan and Services In-house Referral: Clinical Social Work Post Acute Care Choice: Skilled Nursing Facility Living arrangements for the past 2 months: Skilled Nursing Facility            DME Arranged: N/A DME Agency: NA  Prior Living Arrangements/Services Living arrangements for the past 2 months: Skilled Nursing Facility Lives with:: Facility Resident Patient language and need for interpreter reviewed:: Yes Need for Family Participation in Patient Care: Yes (Comment) Care giver support system in place?: Yes (comment) Criminal Activity/Legal Involvement Pertinent to Current Situation/Hospitalization: No - Comment as needed  Activities of Daily Living Home Assistive Devices/Equipment: Cane (specify quad or straight), Walker (specify type) ADL Screening (condition at time of admission) Patient's cognitive ability adequate to safely complete daily activities?: Yes Is the patient deaf or have difficulty hearing?: No Does the patient have difficulty seeing, even when wearing glasses/contacts?: Yes Does the patient have difficulty concentrating, remembering, or making decisions?: No Patient able to express need for  assistance with ADLs?: Yes Does the patient have difficulty dressing or bathing?: Yes Independently performs ADLs?: No Communication: Independent Dressing (OT): Needs assistance Is this a change from baseline?: Pre-admission baseline Grooming: Needs assistance Is this a change from baseline?: Pre-admission baseline Feeding: Independent Bathing: Needs assistance Is this a change from baseline?: Pre-admission baseline Toileting: Needs assistance Is this a change from baseline?: Pre-admission baseline In/Out Bed: Needs assistance Is this a change from baseline?: Pre-admission baseline Walks in Home: Independent with device (comment) Does the patient have difficulty walking or climbing stairs?: Yes Weakness of Legs: Both Weakness of Arms/Hands: None  Emotional Assessment Appearance:: Appears stated age Orientation: : Oriented to Self, Oriented to Place, Oriented to  Time, Oriented to Situation Alcohol / Substance Use: Not Applicable Psych Involvement: No (comment)  Admission diagnosis:  Femur fracture, left (HCC) [S72.92XA] Closed fracture of left femur, unspecified fracture morphology, unspecified portion of femur, initial encounter (HCC) [S72.92XA] Patient Active Problem List   Diagnosis Date Noted   Femur fracture, left (HCC) 03/15/2023   Leukocytosis 03/15/2023   Anemia 03/15/2023   Hypokalemia 03/15/2023   Bronchiectasis without complication (HCC) 12/11/2022   Dementia (HCC) 10/27/2022   Acute pulmonary embolism (HCC) 10/24/2022   Glaucoma 10/24/2022   Fall at home, initial encounter 10/24/2022   DVT (deep venous thrombosis) (HCC) 10/24/2022   Underweight 01/16/2017   Multifocal pneumonia 01/14/2017   Unspecified constipation 09/30/2013   Hip fracture (HCC) 09/28/2013   Essential hypertension 09/28/2013   UTI (urinary tract infection) 09/28/2013   PCP:  Soundra Pilon, FNP Pharmacy:   [facility] [id number] [place], [place] - [address] [place] [address] Phone: [phone] Fax: [phone]  [facility] [id number] [place], [place] - [address] [place] [address] Phone: [phone] Fax: [phone]  Social Determinants of Health (SDOH) Social History: SDOH Screenings   Food Insecurity: No Food Insecurity (03/15/2023)  Housing: Low Risk  (03/15/2023)  Transportation Needs: No Transportation Needs (03/15/2023)  Utilities: Not At Risk (03/15/2023)  Tobacco Use: Low Risk  (03/15/2023)   SDOH Interventions:    Readmission Risk Interventions    10/27/2022    3:07 PM  Readmission Risk Prevention Plan  Post Dischage Appt Complete  Medication Screening Complete  Transportation Screening Complete

## 2023-03-17 DIAGNOSIS — E876 Hypokalemia: Secondary | ICD-10-CM

## 2023-03-17 DIAGNOSIS — D538 Other specified nutritional anemias: Secondary | ICD-10-CM | POA: Diagnosis not present

## 2023-03-17 DIAGNOSIS — D72823 Leukemoid reaction: Secondary | ICD-10-CM

## 2023-03-17 DIAGNOSIS — S7292XA Unspecified fracture of left femur, initial encounter for closed fracture: Secondary | ICD-10-CM | POA: Diagnosis not present

## 2023-03-17 DIAGNOSIS — I10 Essential (primary) hypertension: Secondary | ICD-10-CM | POA: Diagnosis not present

## 2023-03-17 LAB — CBC WITH DIFFERENTIAL/PLATELET
Abs Immature Granulocytes: 0.02 10*3/uL (ref 0.00–0.07)
Basophils Absolute: 0 10*3/uL (ref 0.0–0.1)
Basophils Relative: 0 %
Eosinophils Absolute: 0.1 10*3/uL (ref 0.0–0.5)
Eosinophils Relative: 1 %
HCT: 31.9 % — ABNORMAL LOW (ref 36.0–46.0)
Hemoglobin: 9.9 g/dL — ABNORMAL LOW (ref 12.0–15.0)
Immature Granulocytes: 0 %
Lymphocytes Relative: 19 %
Lymphs Abs: 1.5 10*3/uL (ref 0.7–4.0)
MCH: 27.8 pg (ref 26.0–34.0)
MCHC: 31 g/dL (ref 30.0–36.0)
MCV: 89.6 fL (ref 80.0–100.0)
Monocytes Absolute: 1 10*3/uL (ref 0.1–1.0)
Monocytes Relative: 12 %
Neutro Abs: 5.5 10*3/uL (ref 1.7–7.7)
Neutrophils Relative %: 68 %
Platelets: 226 10*3/uL (ref 150–400)
RBC: 3.56 MIL/uL — ABNORMAL LOW (ref 3.87–5.11)
RDW: 14.6 % (ref 11.5–15.5)
WBC: 8.1 10*3/uL (ref 4.0–10.5)
nRBC: 0 % (ref 0.0–0.2)

## 2023-03-17 LAB — COMPREHENSIVE METABOLIC PANEL
ALT: 71 U/L — ABNORMAL HIGH (ref 0–44)
AST: 200 U/L — ABNORMAL HIGH (ref 15–41)
Albumin: 3.1 g/dL — ABNORMAL LOW (ref 3.5–5.0)
Alkaline Phosphatase: 82 U/L (ref 38–126)
Anion gap: 5 (ref 5–15)
BUN: 23 mg/dL (ref 8–23)
CO2: 25 mmol/L (ref 22–32)
Calcium: 8.5 mg/dL — ABNORMAL LOW (ref 8.9–10.3)
Chloride: 108 mmol/L (ref 98–111)
Creatinine, Ser: 0.7 mg/dL (ref 0.44–1.00)
GFR, Estimated: >60 mL/min (ref >60)
Glucose, Bld: 109 mg/dL — ABNORMAL HIGH (ref 70–99)
Potassium: 3.8 mmol/L (ref 3.5–5.1)
Sodium: 138 mmol/L (ref 135–145)
Total Bilirubin: 1 mg/dL (ref 0.3–1.2)
Total Protein: 6.5 g/dL (ref 6.5–8.1)

## 2023-03-17 LAB — SURGICAL PCR SCREEN
MRSA, PCR: NEGATIVE
Staphylococcus aureus: NEGATIVE

## 2023-03-17 MED ORDER — POLYVINYL ALCOHOL 1.4 % OP SOLN: 1.0000 [drp] | Freq: Two times a day (BID) | OPHTHALMIC | Status: DC | PRN

## 2023-03-17 MED ORDER — BRIMONIDINE TARTRATE 0.2 % OP SOLN
1.0000 [drp] | Freq: Three times a day (TID) | OPHTHALMIC | Status: DC
  Administered 2023-03-17 – 2023-03-21 (×11): 1 [drp] via OPHTHALMIC
  Filled 2023-03-17: qty 5

## 2023-03-17 MED ORDER — LATANOPROST 0.005 % OP SOLN
1.0000 [drp] | Freq: Every day | OPHTHALMIC | Status: DC
  Administered 2023-03-17 – 2023-03-20 (×4): 1 [drp] via OPHTHALMIC
  Filled 2023-03-17: qty 2.5

## 2023-03-17 MED ORDER — PILOCARPINE HCL 4 % OP SOLN
1.0000 [drp] | Freq: Three times a day (TID) | OPHTHALMIC | Status: DC
  Administered 2023-03-17 – 2023-03-21 (×11): 1 [drp] via OPHTHALMIC
  Filled 2023-03-17: qty 15

## 2023-03-17 MED ORDER — DORZOLAMIDE HCL-TIMOLOL MAL 2-0.5 % OP SOLN
1.0000 [drp] | Freq: Two times a day (BID) | OPHTHALMIC | Status: DC
  Administered 2023-03-17 – 2023-03-21 (×9): 1 [drp] via OPHTHALMIC
  Filled 2023-03-17: qty 10

## 2023-03-17 NOTE — Progress Notes (Signed)
[facility]                                                                              Julia Hampton, is a 89 y.o. female, DOB - 1930/04/19, ZOX:[id number] Admit date - 03/15/2023    Outpatient Primary MD for the patient is Soundra Pilon, FNP  LOS - 1  days  Chief Complaint  Patient presents with   Hip Pain    Pt bib EMS from [facility] rehab for a confirmed left femoral fracture after a fall. Pt was attempting to shut door and another resident bumped into her causing her to fall. Denies LOC or hitting head. 22G R forearm 75 mcg of fentanyl given per EMS. Pt is alert and oriented x4. BG 130       Brief summary   Julia Hampton is a 89 y.o. 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   Patient was evaluated by the orthopedic surgical planning for open reduction internal fixation on Thursday.    Assessment & Plan    Principal Problem:   Femur fracture, left (HCC)/ Oblique mildly displaced and angulated left femoral shaft fracture distal to the femoral stem of hip arthroplasty - Secondary to a mechanical fall.   -Ortho was consulted, plan for the OR tomorrow on 5/30 -Continue to hold Eliquis, anemia immobilizer, pain control, Robaxin -Will place Foley catheter, difficulty keeping the pure wick -Pain control, DVT prophylaxis per orthopedics   Active problems Mild leukocytosis -Resolved, likely reactive    Chronic anemia -H&H currently stable, at baseline between 9 and 10  -Will continue to monitor, may need transfusion after surgery  Mild hypokalemia -Replace as needed   Hypertension: -BP currently stable   Dementia:  - Delirium precautions.  Nodular bronchiectasis, subclinical NTM infection:  -Stable, followed by outpatient  pulmonology.  Glaucoma -Awaiting pharmacy med rec to resume outpatient ophthalmic drops  Mild protein calorie malnutrition Nutrition Problem: Inadequate oral intake Etiology: poor appetite Signs/Symptoms: meal completion < 25%, per patient/family report, mild muscle depletion, moderate muscle depletion, mild fat depletion Interventions: Ensure Enlive (each supplement provides 350kcal and 20 grams of protein), MVI, Refer to RD note for recommendations, Education  Estimated body mass index is 19.66 kg/m as calculated from the following:   Height as of this encounter: 5\' 1"  (1.549 m).   Weight as of this encounter: 47.2 kg.  Code Status: DNR DVT Prophylaxis:  SCDs Start: 03/15/23 2228   Level of Care: Level of care: Telemetry Family Communication: Updated patient Disposition Plan:      Remains inpatient appropriate: Pending surgery   Procedures:    Consultants:   Orthopedics  Antimicrobials:   Anti-infectives (From admission, onward)    None          Medications  feeding supplement  237 mL Oral BID BM   [START ON 03/18/2023] feeding supplement  296 mL Oral Once   multivitamin with minerals  1 tablet Oral Daily  Subjective:   Julia Hampton was seen and examined today.  Feels comfortable when she is lying still however pain in the left hip on minimal movement.  Awaiting surgery.  No acute nausea vomiting, chest pain or shortness of breath.  No fevers.  Objective:   Vitals:   03/16/23 0959 03/16/23 1332 03/16/23 2257 03/17/23 0647  BP: (!) 149/68 124/63 (!) 151/86 (!) 143/74  Pulse: 69 76 90 94  Resp: 18 18 15 15   Temp: 98.3 F (36.8 C) 97.9 F (36.6 C) 98.6 F (37 C) (!) 97.4 F (36.3 C)  TempSrc: Oral Oral Oral Oral  SpO2: 100% 93% 95% 96%  Weight:      Height:        Intake/Output Summary (Last 24 hours) at 03/17/2023 1040 Last data filed at 03/17/2023 0840 Gross per 24 hour  Intake 1070.78 ml  Output 750 ml  Net 320.78 ml     Wt  Readings from Last 3 Encounters:  03/15/23 47.2 kg  01/19/23 45.2 kg  12/11/22 43.6 kg     Exam General: Alert and oriented x 3, NAD Cardiovascular: S1 S2 auscultated,  RRR Respiratory: Clear to auscultation bilaterally, no wheezing, rales or rhonchi Gastrointestinal: Soft, nontender, nondistended, + bowel sounds Ext: no pedal edema bilaterally, left lower extremity in the immobilizer Neuro: strength 5/5 in right lower extremity, left lower extremity in the immobilizer Psych: Normal affect     Data Reviewed:  I have personally reviewed following labs    CBC Lab Results  Component Value Date   WBC 8.1 03/17/2023   RBC 3.56 (L) 03/17/2023   HGB 9.9 (L) 03/17/2023   HCT 31.9 (L) 03/17/2023   MCV 89.6 03/17/2023   MCH 27.8 03/17/2023   PLT 226 03/17/2023   MCHC 31.0 03/17/2023   RDW 14.6 03/17/2023   LYMPHSABS 1.5 03/17/2023   MONOABS 1.0 03/17/2023   EOSABS 0.1 03/17/2023   BASOSABS 0.0 03/17/2023     Last metabolic panel Lab Results  Component Value Date   NA 138 03/17/2023   K 3.8 03/17/2023   CL 108 03/17/2023   CO2 25 03/17/2023   BUN 23 03/17/2023   CREATININE 0.70 03/17/2023   GLUCOSE 109 (H) 03/17/2023   GFRNONAA >60 03/17/2023   GFRAA >60 01/22/2017   CALCIUM 8.5 (L) 03/17/2023   PHOS 3.0 10/27/2022   PROT 6.5 03/17/2023   ALBUMIN 3.1 (L) 03/17/2023   BILITOT 1.0 03/17/2023   ALKPHOS 82 03/17/2023   AST 200 (H) 03/17/2023   ALT 71 (H) 03/17/2023   ANIONGAP 5 03/17/2023    CBG (last 3)  No results for input(s): "GLUCAP" in the last 72 hours.    Coagulation Profile: No results for input(s): "INR", "PROTIME" in the last 168 hours.   Radiology Studies: I have personally reviewed the imaging studies  CT FEMUR LEFT WO CONTRAST  Result Date: 03/16/2023 CLINICAL DATA:  Femur fracture EXAM: CT OF THE LOWER LEFT EXTREMITY WITHOUT CONTRAST TECHNIQUE: Multidetector CT imaging of the lower left extremity was performed according to the standard  protocol. RADIATION DOSE REDUCTION: This exam was performed according to the departmental dose-optimization program which includes automated exposure control, adjustment of the mA and/or kV according to patient size and/or use of iterative reconstruction technique. COMPARISON:  Radiographs 03/15/2023 FINDINGS: Bones/Joint/Cartilage Bony demineralization. Suspected old healed fracture of the right inferior pubic ramus, image 68 series 5. On images 69-70 of series 5, there is suspicion for a nondisplaced fracture of the left inferior pubic ramus  posteriorly. There is also some mild cortical irregularity along the superior pubic ramus at the pubic body on image 6 of series 6 which is observed on radiographs from yesterday but not well seen on prior hip and pelvis radiographs, accordingly suspicious for a small nondisplaced fracture involving the left superior pubic ramus. A left hip hemiarthroplasty is in place. The patient had has a known oblique fracture of the femoral shaft demonstrating 3.5 cm overlap along with 2.0 cm medial displacement of the distal fracture fragment with respect to the proximal. There is also a linear nondisplaced fracture extending in the distal fragment anteriorly, which tapers as it approaches the intercondylar notch region, intra-articular extension not certain. Proximal margin of the femoral shaft fracture starts about 3 cm distal to the stem of the hemiarthroplasty and accordingly is not directly periprosthetic. No significant knee joint effusion. Osteoarthritis of the knee with compartmental narrowing implying chondral thinning especially in the medial compartment. Ligaments Suboptimally assessed by CT. Muscles and Tendons Expected edema along fascial planes adjacent to the dominant displaced femoral shaft fracture. Soft tissues Sigmoid colon diverticulosis. IMPRESSION: 1. Oblique fracture of the femoral shaft with 3.5 cm overlap and 2.0 cm medial displacement of the distal fracture  fragment with respect to the proximal. There is also a linear nondisplaced fracture extending in the distal fragment anteriorly, which tapers as it approaches the intercondylar notch region. The margin of this fracture is about 3 cm from the distal hemiarthroplasty stem margin, accordingly no true periprosthetic component observed. 2. Suspected nondisplaced fracture of the left superior pubic ramus at the pubic body. Suspected nondisplaced fracture of the left inferior pubic ramus posteriorly. Both of these are subtle. 3. Bony demineralization. 4. Osteoarthritis of the knee. 5. Sigmoid colon diverticulosis. 6. Old healed fracture of the right inferior pubic ramus. Electronically Signed   By: Gaylyn Rong M.D.   On: 03/16/2023 15:45   CT Head Wo Contrast  Result Date: 03/15/2023 CLINICAL DATA:  Polytrauma, blunt EXAM: CT HEAD WITHOUT CONTRAST TECHNIQUE: Contiguous axial images were obtained from the base of the skull through the vertex without intravenous contrast. RADIATION DOSE REDUCTION: This exam was performed according to the departmental dose-optimization program which includes automated exposure control, adjustment of the mA and/or kV according to patient size and/or use of iterative reconstruction technique. COMPARISON:  CT head 11/02/2022 FINDINGS: Brain: Cerebral ventricle sizes are concordant with the degree of cerebral volume loss. Trace patchy and confluent areas of decreased attenuation are noted throughout the deep and periventricular white matter of the cerebral hemispheres bilaterally, compatible with chronic microvascular ischemic disease. No evidence of large-territorial acute infarction. No parenchymal hemorrhage. No mass lesion. No extra-axial collection. No mass effect or midline shift. No hydrocephalus. Basilar cisterns are patent. Vascular: No hyperdense vessel. Skull: No acute fracture or focal lesion. Sinuses/Orbits: Paranasal sinuses and mastoid air cells are clear. Bilateral lens  replacement. Otherwise the orbits are unremarkable. Other: None. IMPRESSION: No acute intracranial abnormality. Electronically Signed   By: Tish Frederickson M.D.   On: 03/15/2023 21:38   DG Femur Min 2 Views Left  Result Date: 03/15/2023 CLINICAL DATA:  Left femur fracture after fall. EXAM: LEFT FEMUR 2 VIEWS COMPARISON:  None Available. FINDINGS: Oblique femoral shaft fracture. Fracture is distal to the femoral stem of hip arthroplasty. There is no convincing periprosthetic component. Osseous overriding of 4.7 cm with mild apex lateral angulation. The bones are under mineralized. Knee alignment is maintained. IMPRESSION: Oblique mildly displaced and angulated left femoral shaft fracture distal  to the femoral stem of hip arthroplasty. Osseous overriding of 4.7 cm with mild apex lateral angulation. Electronically Signed   By: Narda Rutherford M.D.   On: 03/15/2023 21:30   DG Hip Unilat With Pelvis 2-3 Views Left  Result Date: 03/15/2023 CLINICAL DATA:  Hip fracture. Left femur fracture after fall. EXAM: DG HIP (WITH OR WITHOUT PELVIS) 2-3V LEFT COMPARISON:  None Available. FINDINGS: The femoral shaft fracture is appreciated on concurrent femur exam. Left hip arthroplasty is intact. The bony pelvis is intact, no pubic ramus fracture. Pubic symphysis and sacroiliac joints are congruent. IMPRESSION: 1. Left femoral shaft fracture is appreciated on concurrent femur exam. 2. Intact left hip arthroplasty.  No pelvic fracture. Electronically Signed   By: Narda Rutherford M.D.   On: 03/15/2023 21:28       Dorea Duff M.D. [facility] 03/17/2023, 10:40 AM  Available via Epic secure chat 7am-7pm After 7 pm, please refer to night coverage provider listed on amion.

## 2023-03-18 ENCOUNTER — Inpatient Hospital Stay (HOSPITAL_COMMUNITY): Payer: Medicare PPO | Admitting: Anesthesiology

## 2023-03-18 ENCOUNTER — Encounter (HOSPITAL_COMMUNITY)
Admission: EM | Disposition: A | Discharge status: Skilled Nursing Facility | Payer: Self-pay | Source: Skilled Nursing Facility | Attending: Internal Medicine

## 2023-03-18 ENCOUNTER — Other Ambulatory Visit: Payer: Self-pay

## 2023-03-18 ENCOUNTER — Inpatient Hospital Stay (HOSPITAL_COMMUNITY): Payer: Medicare PPO

## 2023-03-18 DIAGNOSIS — F0150 Vascular dementia without behavioral disturbance: Secondary | ICD-10-CM

## 2023-03-18 DIAGNOSIS — M9702XA Periprosthetic fracture around internal prosthetic left hip joint, initial encounter: Secondary | ICD-10-CM

## 2023-03-18 DIAGNOSIS — D649 Anemia, unspecified: Secondary | ICD-10-CM

## 2023-03-18 DIAGNOSIS — I10 Essential (primary) hypertension: Secondary | ICD-10-CM | POA: Diagnosis not present

## 2023-03-18 DIAGNOSIS — S7292XA Unspecified fracture of left femur, initial encounter for closed fracture: Secondary | ICD-10-CM | POA: Diagnosis not present

## 2023-03-18 DIAGNOSIS — E876 Hypokalemia: Secondary | ICD-10-CM | POA: Diagnosis not present

## 2023-03-18 HISTORY — PX: ORIF FEMUR FRACTURE: SHX2119

## 2023-03-18 LAB — CBC
HCT: 29.9 % — ABNORMAL LOW (ref 36.0–46.0)
HCT: 32.8 % — ABNORMAL LOW (ref 36.0–46.0)
Hemoglobin: 9.3 g/dL — ABNORMAL LOW (ref 12.0–15.0)
Hemoglobin: 10.1 g/dL — ABNORMAL LOW (ref 12.0–15.0)
MCH: 28 pg (ref 26.0–34.0)
MCH: 27.4 pg (ref 26.0–34.0)
MCHC: 31.1 g/dL (ref 30.0–36.0)
MCHC: 30.8 g/dL (ref 30.0–36.0)
MCV: 90.1 fL (ref 80.0–100.0)
MCV: 89.1 fL (ref 80.0–100.0)
Platelets: 189 10*3/uL (ref 150–400)
Platelets: 181 10*3/uL (ref 150–400)
RBC: 3.32 MIL/uL — ABNORMAL LOW (ref 3.87–5.11)
RBC: 3.68 MIL/uL — ABNORMAL LOW (ref 3.87–5.11)
RDW: 15.2 % (ref 11.5–15.5)
RDW: 15 % (ref 11.5–15.5)
WBC: 8.7 10*3/uL (ref 4.0–10.5)
WBC: 10.9 10*3/uL — ABNORMAL HIGH (ref 4.0–10.5)
nRBC: 0 % (ref 0.0–0.2)
nRBC: 0 % (ref 0.0–0.2)

## 2023-03-18 LAB — COMPREHENSIVE METABOLIC PANEL
ALT: 41 U/L (ref 0–44)
AST: 63 U/L — ABNORMAL HIGH (ref 15–41)
Albumin: 2.8 g/dL — ABNORMAL LOW (ref 3.5–5.0)
Alkaline Phosphatase: 74 U/L (ref 38–126)
Anion gap: 6 (ref 5–15)
BUN: 26 mg/dL — ABNORMAL HIGH (ref 8–23)
CO2: 25 mmol/L (ref 22–32)
Calcium: 8.3 mg/dL — ABNORMAL LOW (ref 8.9–10.3)
Chloride: 106 mmol/L (ref 98–111)
Creatinine, Ser: 0.63 mg/dL (ref 0.44–1.00)
GFR, Estimated: >60 mL/min (ref >60)
Glucose, Bld: 98 mg/dL (ref 70–99)
Potassium: 4.2 mmol/L (ref 3.5–5.1)
Sodium: 137 mmol/L (ref 135–145)
Total Bilirubin: 0.8 mg/dL (ref 0.3–1.2)
Total Protein: 6.2 g/dL — ABNORMAL LOW (ref 6.5–8.1)

## 2023-03-18 LAB — POCT I-STAT, CHEM 8
BUN: 17 mg/dL (ref 8–23)
Calcium, Ion: 1.16 mmol/L (ref 1.15–1.40)
Chloride: 104 mmol/L (ref 98–111)
Creatinine, Ser: 0.5 mg/dL (ref 0.44–1.00)
Glucose, Bld: 102 mg/dL — ABNORMAL HIGH (ref 70–99)
HCT: 23 % — ABNORMAL LOW (ref 36.0–46.0)
Hemoglobin: 7.8 g/dL — ABNORMAL LOW (ref 12.0–15.0)
Potassium: 4 mmol/L (ref 3.5–5.1)
Sodium: 139 mmol/L (ref 135–145)
TCO2: 22 mmol/L (ref 22–32)

## 2023-03-18 LAB — TYPE AND SCREEN

## 2023-03-18 LAB — BPAM RBC

## 2023-03-18 SURGERY — OPEN REDUCTION INTERNAL FIXATION (ORIF) DISTAL FEMUR FRACTURE
Anesthesia: General | Site: Hip | Laterality: Left

## 2023-03-18 MED ORDER — ROCURONIUM BROMIDE 100 MG/10ML IV SOLN
INTRAVENOUS | Status: DC | PRN
  Administered 2023-03-18: 10 mg via INTRAVENOUS
  Administered 2023-03-18: 50 mg via INTRAVENOUS

## 2023-03-18 MED ORDER — CHLORHEXIDINE GLUCONATE 4 % EX SOLN
60.0000 mL | Freq: Once | CUTANEOUS | Status: AC
  Administered 2023-03-18: 4 via TOPICAL

## 2023-03-18 MED ORDER — LACTATED RINGERS IV SOLN: INTRAVENOUS | Status: DC

## 2023-03-18 MED ORDER — PROPOFOL 10 MG/ML IV BOLUS
INTRAVENOUS | Status: DC | PRN
  Administered 2023-03-18 (×2): 50 mg via INTRAVENOUS

## 2023-03-18 MED ORDER — CEFAZOLIN SODIUM-DEXTROSE 2-4 GM/100ML-% IV SOLN
2.0000 g | INTRAVENOUS | Status: AC
  Administered 2023-03-18: 2 g via INTRAVENOUS
  Filled 2023-03-18: qty 100

## 2023-03-18 MED ORDER — CHLORHEXIDINE GLUCONATE 4 % EX SOLN
60.0000 mL | Freq: Once | CUTANEOUS | Status: DC
Start: 2023-03-18 — End: 2023-03-18

## 2023-03-18 MED ORDER — KETAMINE HCL 10 MG/ML IJ SOLN
INTRAMUSCULAR | Status: DC | PRN
  Administered 2023-03-18: 20 mg via INTRAVENOUS

## 2023-03-18 MED ORDER — DOCUSATE SODIUM 100 MG PO CAPS
100.0000 mg | ORAL_CAPSULE | Freq: Two times a day (BID) | ORAL | Status: DC
  Administered 2023-03-18 – 2023-03-21 (×5): 100 mg via ORAL
  Filled 2023-03-18 (×5): qty 1

## 2023-03-18 MED ORDER — HYDROCODONE-ACETAMINOPHEN 5-325 MG PO TABS
1.0000 | ORAL_TABLET | ORAL | Status: DC | PRN
  Administered 2023-03-19 – 2023-03-21 (×3): 1 via ORAL
  Filled 2023-03-18 (×3): qty 1

## 2023-03-18 MED ORDER — AMISULPRIDE (ANTIEMETIC) 5 MG/2ML IV SOLN: 10.0000 mg | Freq: Once | INTRAVENOUS | Status: DC | PRN

## 2023-03-18 MED ORDER — HYDROCODONE-ACETAMINOPHEN 7.5-325 MG PO TABS
1.0000 | ORAL_TABLET | ORAL | Status: DC | PRN
  Administered 2023-03-20 (×3): 1 via ORAL
  Filled 2023-03-18 (×3): qty 1

## 2023-03-18 MED ORDER — POVIDONE-IODINE 10 % EX SWAB: 2.0000 | Freq: Once | CUTANEOUS | Status: DC

## 2023-03-18 MED ORDER — FENTANYL CITRATE (PF) 100 MCG/2ML IJ SOLN
INTRAMUSCULAR | Status: DC | PRN
  Administered 2023-03-18: 50 ug via INTRAVENOUS
  Administered 2023-03-18 (×2): 25 ug via INTRAVENOUS
  Administered 2023-03-18: 50 ug via INTRAVENOUS

## 2023-03-18 MED ORDER — CHLORHEXIDINE GLUCONATE CLOTH 2 % EX PADS
6.0000 | MEDICATED_PAD | Freq: Every day | CUTANEOUS | Status: DC
  Administered 2023-03-18 – 2023-03-19 (×2): 6 via TOPICAL

## 2023-03-18 MED ORDER — CHLORHEXIDINE GLUCONATE 0.12 % MT SOLN
15.0000 mL | Freq: Once | OROMUCOSAL | Status: AC
  Administered 2023-03-18: 15 mL via OROMUCOSAL

## 2023-03-18 MED ORDER — MIRTAZAPINE 15 MG PO TABS
7.5000 mg | ORAL_TABLET | Freq: Every day | ORAL | Status: DC
  Administered 2023-03-18 – 2023-03-20 (×3): 7.5 mg via ORAL
  Filled 2023-03-18 (×3): qty 1

## 2023-03-18 MED ORDER — PROPOFOL 10 MG/ML IV BOLUS
INTRAVENOUS | Status: AC
  Filled 2023-03-18: qty 20

## 2023-03-18 MED ORDER — MORPHINE SULFATE (PF) 2 MG/ML IV SOLN: 0.5000 mg | INTRAVENOUS | Status: DC | PRN

## 2023-03-18 MED ORDER — MENTHOL 3 MG MT LOZG: 1.0000 | LOZENGE | OROMUCOSAL | Status: DC | PRN

## 2023-03-18 MED ORDER — VANCOMYCIN HCL 1000 MG IV SOLR
INTRAVENOUS | Status: AC
  Filled 2023-03-18: qty 40

## 2023-03-18 MED ORDER — ONDANSETRON HCL 4 MG/2ML IJ SOLN: 4.0000 mg | Freq: Four times a day (QID) | INTRAMUSCULAR | Status: DC | PRN

## 2023-03-18 MED ORDER — ALBUMIN HUMAN 5 % IV SOLN: INTRAVENOUS | Status: DC | PRN

## 2023-03-18 MED ORDER — ACETAMINOPHEN 500 MG PO TABS
1000.0000 mg | ORAL_TABLET | Freq: Once | ORAL | Status: AC
  Administered 2023-03-18: 1000 mg via ORAL
  Filled 2023-03-18: qty 2

## 2023-03-18 MED ORDER — STERILE WATER FOR IRRIGATION IR SOLN
Status: DC | PRN
  Administered 2023-03-18: 2000 mL

## 2023-03-18 MED ORDER — EPHEDRINE SULFATE (PRESSORS) 50 MG/ML IJ SOLN
INTRAMUSCULAR | Status: DC | PRN
  Administered 2023-03-18 (×5): 10 mg via INTRAVENOUS

## 2023-03-18 MED ORDER — METOCLOPRAMIDE HCL 5 MG PO TABS: 5.0000 mg | ORAL_TABLET | Freq: Three times a day (TID) | ORAL | Status: DC | PRN

## 2023-03-18 MED ORDER — ALBUTEROL SULFATE (2.5 MG/3ML) 0.083% IN NEBU: 2.5000 mg | INHALATION_SOLUTION | Freq: Four times a day (QID) | RESPIRATORY_TRACT | Status: DC | PRN

## 2023-03-18 MED ORDER — KETAMINE HCL 50 MG/5ML IJ SOSY
PREFILLED_SYRINGE | INTRAMUSCULAR | Status: AC
  Filled 2023-03-18: qty 5

## 2023-03-18 MED ORDER — CITALOPRAM HYDROBROMIDE 20 MG PO TABS
20.0000 mg | ORAL_TABLET | Freq: Every day | ORAL | Status: DC
  Administered 2023-03-19 – 2023-03-21 (×3): 20 mg via ORAL
  Filled 2023-03-18 (×3): qty 1

## 2023-03-18 MED ORDER — TRANEXAMIC ACID-NACL 1000-0.7 MG/100ML-% IV SOLN
1000.0000 mg | INTRAVENOUS | Status: AC
  Administered 2023-03-18: 1000 mg via INTRAVENOUS
  Filled 2023-03-18: qty 100

## 2023-03-18 MED ORDER — TRANEXAMIC ACID-NACL 1000-0.7 MG/100ML-% IV SOLN: 1000.0000 mg | INTRAVENOUS | Status: DC

## 2023-03-18 MED ORDER — PHENYLEPHRINE HCL-NACL 20-0.9 MG/250ML-% IV SOLN
INTRAVENOUS | Status: DC | PRN
  Administered 2023-03-18: 30 ug/min via INTRAVENOUS

## 2023-03-18 MED ORDER — SUGAMMADEX SODIUM 200 MG/2ML IV SOLN
INTRAVENOUS | Status: DC | PRN
  Administered 2023-03-18: 100 mg via INTRAVENOUS

## 2023-03-18 MED ORDER — SENNA 8.6 MG PO TABS
1.0000 | ORAL_TABLET | Freq: Two times a day (BID) | ORAL | Status: DC
  Administered 2023-03-18 – 2023-03-21 (×4): 8.6 mg via ORAL
  Filled 2023-03-18 (×4): qty 1

## 2023-03-18 MED ORDER — FENTANYL CITRATE PF 50 MCG/ML IJ SOSY: 25.0000 ug | PREFILLED_SYRINGE | INTRAMUSCULAR | Status: DC | PRN

## 2023-03-18 MED ORDER — VANCOMYCIN HCL 1000 MG IV SOLR
INTRAVENOUS | Status: DC | PRN
  Administered 2023-03-18: 2000 mg via TOPICAL

## 2023-03-18 MED ORDER — METHOCARBAMOL 500 MG PO TABS
500.0000 mg | ORAL_TABLET | Freq: Four times a day (QID) | ORAL | Status: DC | PRN
  Administered 2023-03-19 – 2023-03-20 (×2): 500 mg via ORAL
  Filled 2023-03-18 (×2): qty 1

## 2023-03-18 MED ORDER — SODIUM CHLORIDE 0.9 % IV SOLN: 10.0000 mL/h | Freq: Once | INTRAVENOUS | Status: DC

## 2023-03-18 MED ORDER — SODIUM CHLORIDE 0.9 % IV SOLN: INTRAVENOUS | Status: DC

## 2023-03-18 MED ORDER — 0.9 % SODIUM CHLORIDE (POUR BTL) OPTIME
TOPICAL | Status: DC | PRN
  Administered 2023-03-18: 1000 mL

## 2023-03-18 MED ORDER — LIDOCAINE HCL (CARDIAC) PF 100 MG/5ML IV SOSY
PREFILLED_SYRINGE | INTRAVENOUS | Status: DC | PRN
  Administered 2023-03-18: 80 mg via INTRAVENOUS

## 2023-03-18 MED ORDER — ONDANSETRON HCL 4 MG PO TABS: 4.0000 mg | ORAL_TABLET | Freq: Four times a day (QID) | ORAL | Status: DC | PRN

## 2023-03-18 MED ORDER — PROMETHAZINE HCL 25 MG/ML IJ SOLN: 6.2500 mg | INTRAMUSCULAR | Status: DC | PRN

## 2023-03-18 MED ORDER — METOCLOPRAMIDE HCL 5 MG/ML IJ SOLN: 5.0000 mg | Freq: Three times a day (TID) | INTRAMUSCULAR | Status: DC | PRN

## 2023-03-18 MED ORDER — FENTANYL CITRATE (PF) 100 MCG/2ML IJ SOLN
INTRAMUSCULAR | Status: AC
  Filled 2023-03-18: qty 2

## 2023-03-18 MED ORDER — LACTATED RINGERS IV SOLN: INTRAVENOUS | Status: DC | PRN

## 2023-03-18 MED ORDER — PHENOL 1.4 % MT LIQD: 1.0000 | OROMUCOSAL | Status: DC | PRN

## 2023-03-18 MED ORDER — ALBUMIN HUMAN 5 % IV SOLN
INTRAVENOUS | Status: AC
  Filled 2023-03-18: qty 250

## 2023-03-18 MED ORDER — ENOXAPARIN SODIUM 30 MG/0.3ML IJ SOSY
30.0000 mg | PREFILLED_SYRINGE | INTRAMUSCULAR | Status: DC
  Administered 2023-03-19 – 2023-03-21 (×3): 30 mg via SUBCUTANEOUS
  Filled 2023-03-18 (×3): qty 0.3

## 2023-03-18 MED ORDER — ONDANSETRON HCL 4 MG/2ML IJ SOLN
INTRAMUSCULAR | Status: DC | PRN
  Administered 2023-03-18: 4 mg via INTRAVENOUS

## 2023-03-18 MED ORDER — METHOCARBAMOL 1000 MG/10ML IJ SOLN: 500.0000 mg | Freq: Four times a day (QID) | INTRAVENOUS | Status: DC | PRN

## 2023-03-18 MED ORDER — CEFAZOLIN SODIUM-DEXTROSE 2-4 GM/100ML-% IV SOLN: 2.0000 g | INTRAVENOUS | Status: DC

## 2023-03-18 MED ORDER — DEXAMETHASONE SODIUM PHOSPHATE 4 MG/ML IJ SOLN
INTRAMUSCULAR | Status: DC | PRN
  Administered 2023-03-18: 8 mg via INTRAVENOUS

## 2023-03-18 MED ORDER — ORAL CARE MOUTH RINSE: 15.0000 mL | Freq: Once | OROMUCOSAL | Status: AC

## 2023-03-18 MED ORDER — ACETAMINOPHEN 325 MG PO TABS
325.0000 mg | ORAL_TABLET | Freq: Four times a day (QID) | ORAL | Status: DC | PRN
  Administered 2023-03-19 – 2023-03-20 (×2): 650 mg via ORAL
  Filled 2023-03-18 (×2): qty 2

## 2023-03-18 MED ORDER — ACETAMINOPHEN 500 MG PO TABS: 1000.0000 mg | ORAL_TABLET | Freq: Once | ORAL | Status: DC

## 2023-03-18 SURGICAL SUPPLY — 84 items
APL PRP STRL LF DISP 70% ISPRP (MISCELLANEOUS) ×1
ASMB CBL PN 914X1.8 STRL (Trauma Fixation) ×2 IMPLANT
BAG COUNTER SPONGE SURGICOUNT (BAG) IMPLANT
BAG SPEC THK2 15X12 ZIP CLS (MISCELLANEOUS) ×1
BAG SPNG CNTER NS LX DISP (BAG)
BAG ZIPLOCK 12X15 (MISCELLANEOUS) ×1 IMPLANT
BIT DRILL 4.3 (BIT) ×1
BIT DRILL 4.3X300MM (BIT) IMPLANT
BIT DRILL LONG 3.3 (BIT) IMPLANT
BIT DRILL QC 3.3X195 (BIT) IMPLANT
BNDG GAUZE DERMACEA FLUFF 4 (GAUZE/BANDAGES/DRESSINGS) ×1 IMPLANT
BNDG GZE DERMACEA 4 6PLY (GAUZE/BANDAGES/DRESSINGS) ×1
CABLE CERLAGE W/CRIMP 1.8 (Cable) IMPLANT
CABLE READY CERCLAGE W/CRIP (Trauma Fixation) IMPLANT
CAP LOCK NCB (Cap) IMPLANT
CHLORAPREP W/TINT 26 (MISCELLANEOUS) ×1 IMPLANT
COVER SURGICAL LIGHT HANDLE (MISCELLANEOUS) ×1 IMPLANT
DRAPE C-ARM 42X120 X-RAY (DRAPES) ×1 IMPLANT
DRAPE C-ARMOR (DRAPES) ×1 IMPLANT
DRAPE INCISE IOBAN 66X45 STRL (DRAPES) IMPLANT
DRAPE ORTHO SPLIT 77X108 STRL (DRAPES) ×2
DRAPE POUCH INSTRU U-SHP 10X18 (DRAPES) ×1 IMPLANT
DRAPE SHEET LG 3/4 BI-LAMINATE (DRAPES) ×3 IMPLANT
DRAPE STERI IOBAN 125X83 (DRAPES) IMPLANT
DRAPE SURG 17X23 STRL (DRAPES) IMPLANT
DRAPE SURG ORHT 6 SPLT 77X108 (DRAPES) ×2 IMPLANT
DRAPE U-SHAPE 47X51 STRL (DRAPES) IMPLANT
DRESSING PREVENA PLUS CUSTOM (GAUZE/BANDAGES/DRESSINGS) IMPLANT
DRSG EMULSION OIL 3X16 NADH (GAUZE/BANDAGES/DRESSINGS) ×1 IMPLANT
DRSG PREVENA PLUS CUSTOM (GAUZE/BANDAGES/DRESSINGS) ×1
ELECT REM PT RETURN 15FT ADLT (MISCELLANEOUS) ×1 IMPLANT
GAUZE SPONGE 4X4 12PLY STRL (GAUZE/BANDAGES/DRESSINGS) ×1 IMPLANT
GLOVE BIO SURGEON STRL SZ8.5 (GLOVE) ×2 IMPLANT
GLOVE BIOGEL M 7.0 STRL (GLOVE) ×1 IMPLANT
GLOVE BIOGEL PI IND STRL 7.5 (GLOVE) ×1 IMPLANT
GLOVE BIOGEL PI IND STRL 8 (GLOVE) ×1 IMPLANT
GLOVE BIOGEL PI IND STRL 8.5 (GLOVE) ×1 IMPLANT
GLOVE SURG LX STRL 7.5 STRW (GLOVE) ×2 IMPLANT
GOWN SPEC L3 XXLG W/TWL (GOWN DISPOSABLE) ×2 IMPLANT
JET LAVAGE IRRISEPT WOUND (IRRIGATION / IRRIGATOR)
K-WIRE 2.0 (WIRE) ×2
K-WIRE FXSTD 280X2XNS SS (WIRE) ×2
KIT BASIN OR (CUSTOM PROCEDURE TRAY) ×1 IMPLANT
KIT DRSG PREVENA PLUS 7DAY 125 (MISCELLANEOUS) IMPLANT
KIT TURNOVER KIT A (KITS) IMPLANT
KWIRE FXSTD 280X2XNS SS (WIRE) IMPLANT
LAVAGE JET IRRISEPT WOUND (IRRIGATION / IRRIGATOR) IMPLANT
LOCKPLATE CABLE BUTTON NCP HIP (Orthopedic Implant) IMPLANT
MANIFOLD NEPTUNE II (INSTRUMENTS) ×1 IMPLANT
MARKER PEN SURG W/LABELS BLK (STERILIZATION PRODUCTS) IMPLANT
NS IRRIG 1000ML POUR BTL (IV SOLUTION) ×1 IMPLANT
PACK TOTAL JOINT (CUSTOM PROCEDURE TRAY) ×1 IMPLANT
PLATE NCB 15H HIP (Plate) IMPLANT
PROTECTOR NERVE ULNAR (MISCELLANEOUS) ×1 IMPLANT
SCREW 5.0 32MM (Screw) IMPLANT
SCREW 5.0 60MM (Screw) IMPLANT
SCREW CORT NCB SELFTAP 5.0X50 (Screw) IMPLANT
SCREW CORTICAL NCB 5.0X44 (Screw) IMPLANT
SCREW NCB 4.0 32MM (Screw) IMPLANT
SCREW NCB 4.0MX55M (Screw) IMPLANT
SCREW NCB 4X3 4X70 (Screw) IMPLANT
SCREW NCB 5.0X28MM (Screw) IMPLANT
SCREW NCB 5.0X30MM (Screw) IMPLANT
SCREW NCB 5.0X34MM (Screw) IMPLANT
SEALER BIPOLAR AQUA 6.0 (INSTRUMENTS) IMPLANT
SET INTERPULSE LAVAGE W/TIP (ORTHOPEDIC DISPOSABLE SUPPLIES) IMPLANT
STAPLER SKIN PROX 35W (STAPLE) IMPLANT
STAPLER VISISTAT 35W (STAPLE) ×1 IMPLANT
STRIP CLOSURE SKIN 1/2X4 (GAUZE/BANDAGES/DRESSINGS) ×1 IMPLANT
SUT MNCRL AB 3-0 PS2 18 (SUTURE) ×1 IMPLANT
SUT MNCRL AB 4-0 PS2 18 (SUTURE) ×1 IMPLANT
SUT MON AB 2-0 CT1 36 (SUTURE) IMPLANT
SUT STEEL 7 (SUTURE) IMPLANT
SUT STRATAFIX PDO 1 14 VIOLET (SUTURE) ×1
SUT STRATFX PDO 1 14 VIOLET (SUTURE) ×1
SUT VIC AB 1 CT1 36 (SUTURE) ×2 IMPLANT
SUT VIC AB 1 CTX 36 (SUTURE) ×2
SUT VIC AB 1 CTX36XBRD ANBCTR (SUTURE) IMPLANT
SUT VIC AB 2-0 CT1 27 (SUTURE) ×2
SUT VIC AB 2-0 CT1 TAPERPNT 27 (SUTURE) ×2 IMPLANT
SUTURE STRATFX PDO 1 14 VIOLET (SUTURE) ×1 IMPLANT
TOWEL OR 17X26 10 PK STRL BLUE (TOWEL DISPOSABLE) ×2 IMPLANT
TUBE SUCTION HIGH CAP CLEAR NV (SUCTIONS) IMPLANT
WATER STERILE IRR 1000ML POUR (IV SOLUTION) ×1 IMPLANT

## 2023-03-18 NOTE — Progress Notes (Signed)
[facility]                                                                              Julia Hampton, is a 89 y.o. female, DOB - 09-20-1930, WUJ:[id number] Admit date - 03/15/2023    Outpatient Primary MD for the patient is Soundra Pilon, FNP  LOS - 2  days  Chief Complaint  Patient presents with   Hip Pain    Pt bib EMS from [facility] rehab for a confirmed left femoral fracture after a fall. Pt was attempting to shut door and another resident bumped into her causing her to fall. Denies LOC or hitting head. 22G R forearm 75 mcg of fentanyl given per EMS. Pt is alert and oriented x4. BG 130       Brief summary   CIANDRA Hampton is a 89 y.o. 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   Patient was evaluated by the orthopedic surgical planning for open reduction internal fixation on Thursday.    Assessment & Plan    Principal Problem:   Femur fracture, left (HCC)/ Oblique mildly displaced and angulated left femoral shaft fracture distal to the femoral stem of hip arthroplasty - Secondary to a mechanical fall.   -Ortho was consulted, plan for OR today -Continue to hold Eliquis, will resume Eliquis after the surgery once cleared by orthopedics -Pain control, DVT prophylaxis per Ortho   Active problems Mild leukocytosis -Resolved, likely reactive   Chronic anemia -H&H currently stable, at baseline between 9 and 10  -Continue to monitor, may need transfusion after surgery.  Mild hypokalemia -Replace as needed   Hypertension: -BP stable  Dementia:  - Delirium precautions.  Currently at baseline.  Nodular bronchiectasis, subclinical NTM infection:  -Stable, followed by outpatient pulmonology.  Glaucoma -Awaiting pharmacy med rec to  resume outpatient ophthalmic drops  Mild transaminitis -Unclear etiology, improving  Mild protein calorie malnutrition Nutrition Problem: Inadequate oral intake Etiology: poor appetite Signs/Symptoms: meal completion < 25%, per patient/family report, mild muscle depletion, moderate muscle depletion, mild fat depletion Interventions: Ensure Enlive (each supplement provides 350kcal and 20 grams of protein), MVI, Refer to RD note for recommendations, Education  Estimated body mass index is 19.66 kg/m as calculated from the following:   Height as of this encounter: 5\' 1"  (1.549 m).   Weight as of this encounter: 47.2 kg.  Code Status: DNR DVT Prophylaxis:  SCDs Start: 03/15/23 2228   Level of Care: Level of care: Telemetry Family Communication: Updated patient Disposition Plan:      Remains inpatient appropriate: Pending surgery   Procedures:    Consultants:   Orthopedics  Antimicrobials:   Anti-infectives (From admission, onward)    Start     Dose/Rate Route Frequency Ordered Stop   03/18/23 1600  ceFAZolin (ANCEF) IVPB 2g/100 mL premix        2 g 200 mL/hr over 30 Minutes Intravenous On call to O.R. 03/18/23 9562 03/19/23 0559   03/18/23 0915  ceFAZolin (ANCEF) IVPB 2g/100 mL premix  Status:  Discontinued        2 g 200 mL/hr over 30 Minutes Intravenous On call to O.R. 03/18/23 1610 03/18/23 0817          Medications  acetaminophen  1,000 mg Oral Once   brimonidine  1 drop Left Eye TID   chlorhexidine  60 mL Topical Once   Chlorhexidine Gluconate Cloth  6 each Topical Daily   dorzolamide-timolol  1 drop Both Eyes BID   feeding supplement  237 mL Oral BID BM   latanoprost  1 drop Left Eye QHS   multivitamin with minerals  1 tablet Oral Daily   pilocarpine  1 drop Left Eye TID   povidone-iodine  2 Application Topical Once      Subjective:   [name] was seen and examined today.  No acute complaints, awaiting surgery today.  No fevers, chest pain,  shortness of breath, nausea or vomiting.    Objective:   Vitals:   03/17/23 0647 03/17/23 1338 03/17/23 2111 03/18/23 0505  BP: (!) 143/74 (!) 112/53 123/62 127/70  Pulse: 94 85 80 94  Resp: 15 16 13 14   Temp: (!) 97.4 F (36.3 C) 98 F (36.7 C) 98.7 F (37.1 C) 98.6 F (37 C)  TempSrc: Oral Oral Oral Oral  SpO2: 96% 95% 95% 96%  Weight:      Height:        Intake/Output Summary (Last 24 hours) at 03/18/2023 1124 Last data filed at 03/18/2023 0600 Gross per 24 hour  Intake 626 ml  Output 350 ml  Net 276 ml     Wt Readings from Last 3 Encounters:  03/15/23 47.2 kg  01/19/23 45.2 kg  12/11/22 43.6 kg   Physical Exam General: Alert and oriented x 3, NAD Cardiovascular: S1 S2 clear, RRR.  Respiratory: CTAB, no wheezing Gastrointestinal: Soft, nontender, nondistended, NBS Ext: no pedal edema bilaterally, LLE in the immobilizer Neuro: no new deficits Psych: Normal affect    Data Reviewed:  I have personally reviewed following labs    CBC Lab Results  Component Value Date   WBC 8.7 03/18/2023   RBC 3.32 (L) 03/18/2023   HGB 9.3 (L) 03/18/2023   HCT 29.9 (L) 03/18/2023   MCV 90.1 03/18/2023   MCH 28.0 03/18/2023   PLT 189 03/18/2023   MCHC 31.1 03/18/2023   RDW 15.2 03/18/2023   LYMPHSABS 1.5 03/17/2023   MONOABS 1.0 03/17/2023   EOSABS 0.1 03/17/2023   BASOSABS 0.0 03/17/2023     Last metabolic panel Lab Results  Component Value Date   NA 137 03/18/2023   K 4.2 03/18/2023   CL 106 03/18/2023   CO2 25 03/18/2023   BUN 26 (H) 03/18/2023   CREATININE 0.63 03/18/2023   GLUCOSE 98 03/18/2023   GFRNONAA >60 03/18/2023   GFRAA >60 01/22/2017   CALCIUM 8.3 (L) 03/18/2023   PHOS 3.0 10/27/2022   PROT 6.2 (L) 03/18/2023   ALBUMIN 2.8 (L) 03/18/2023   BILITOT 0.8 03/18/2023   ALKPHOS 74 03/18/2023   AST 63 (H) 03/18/2023   ALT 41 03/18/2023   ANIONGAP 6 03/18/2023    CBG (last 3)  No results for input(s): "GLUCAP" in the last 72 hours.     Coagulation Profile: No results for input(s): "INR", "PROTIME" in the last 168 hours.   Radiology Studies: I have personally reviewed the imaging studies  No results found.     Thad Ranger M.D. [facility] 03/18/2023,  11:24 AM  Available via Epic secure chat 7am-7pm After 7 pm, please refer to night coverage provider listed on amion.

## 2023-03-18 NOTE — Anesthesia Postprocedure Evaluation (Signed)
Anesthesia Post Note  Patient: Julia Hampton  Procedure(s) Performed: OPEN REDUCTION INTERNAL FIXATION (ORIF) LEFT FEMUR FRACTURE (Left: Hip)     Patient location during evaluation: PACU Anesthesia Type: General Level of consciousness: sedated Pain management: pain level controlled Vital Signs Assessment: post-procedure vital signs reviewed and stable Respiratory status: spontaneous breathing and respiratory function stable Cardiovascular status: stable Postop Assessment: no apparent nausea or vomiting Anesthetic complications: no  No notable events documented.  Last Vitals:  Vitals:   03/18/23 2030 03/18/23 2051  BP: 117/87 115/74  Pulse: 76 77  Resp: 10 16  Temp: 36.6 C (!) 36.4 C  SpO2: 99% 100%    Last Pain:  Vitals:   03/18/23 2030  TempSrc:   PainSc: 0-No pain                 Elimelech Houseman DANIEL

## 2023-03-18 NOTE — Transfer of Care (Signed)
Immediate Anesthesia Transfer of Care Note  Patient: Julia Hampton  Procedure(s) Performed: OPEN REDUCTION INTERNAL FIXATION (ORIF) LEFT FEMUR FRACTURE (Left: Hip)  Patient Location: PACU  Anesthesia Type:General  Level of Consciousness: drowsy and patient cooperative  Airway & Oxygen Therapy: Patient Spontanous Breathing and Patient connected to face mask oxygen  Post-op Assessment: Report given to RN and Post -op Vital signs reviewed and stable  Post vital signs: Reviewed and stable  Last Vitals:  Vitals Value Taken Time  BP 114/77 03/18/23 1945  Temp    Pulse 88 03/18/23 1947  Resp 12 03/18/23 1947  SpO2 100 % 03/18/23 1947  Vitals shown include unvalidated device data.  Last Pain:  Vitals:   03/18/23 0800  TempSrc:   PainSc: 0-No pain      Patients Stated Pain Goal: 2 (03/18/23 0345)  Complications: No notable events documented.

## 2023-03-18 NOTE — Anesthesia Preprocedure Evaluation (Addendum)
Anesthesia Evaluation  Patient identified by MRN, date of birth, ID band Patient awake    Reviewed: Allergy & Precautions, NPO status , Patient's Chart, lab work & pertinent test results  History of Anesthesia Complications (+) PROLONGED EMERGENCE and history of anesthetic complications  Airway Mallampati: II  TM Distance: >3 FB Neck ROM: Full    Dental  (+) Dental Advisory Given   Pulmonary neg pulmonary ROS   Pulmonary exam normal        Cardiovascular hypertension, Pt. on medications Normal cardiovascular exam  IMPRESSIONS      1. Left ventricular ejection fraction, by estimation, is 65 to 70%. The left ventricle has normal function. The left ventricle has no regional wall motion abnormalities. There is moderate left ventricular hypertrophy. Left ventricular diastolic parameters are indeterminate. Elevated left ventricular end-diastolic pressure.  2. Right ventricular systolic function is normal. The right ventricular size is normal. There is normal pulmonary artery systolic pressure.  3. The mitral valve is grossly normal. Trivial mitral valve regurgitation. No evidence of mitral stenosis.  4. The aortic valve is tricuspid. There is mild calcification of the aortic valve. There is mild thickening of the aortic valve. Aortic valve regurgitation is trivial. Aortic valve sclerosis is present, with no evidence of aortic valve stenosis.  5. The inferior vena cava is normal in size with greater than 50% respiratory variability, suggesting right atrial pressure of 3 mmHg.   Comparison(s): No prior Echocardiogram.    Neuro/Psych  PSYCHIATRIC DISORDERS     Dementia negative neurological ROS     GI/Hepatic negative GI ROS, Neg liver ROS,,,  Endo/Other  negative endocrine ROS    Renal/GU negative Renal ROS     Musculoskeletal negative musculoskeletal ROS (+)    Abdominal   Peds  Hematology  (+) Blood dyscrasia, anemia    Anesthesia Other Findings   Reproductive/Obstetrics                             Anesthesia Physical Anesthesia Plan  ASA: 3  Anesthesia Plan: General   Post-op Pain Management: Ofirmev IV (intra-op)* and Toradol IV (intra-op)*   Induction: Intravenous  PONV Risk Score and Plan: 4 or greater and Ondansetron, Dexamethasone and Diphenhydramine  Airway Management Planned: Oral ETT  Additional Equipment:   Intra-op Plan:   Post-operative Plan: Extubation in OR  Informed Consent:    Patient has DNR.  Discussed DNR with patient and Suspend DNR.   Dental advisory given  Plan Discussed with: Anesthesiologist and CRNA  Anesthesia Plan Comments: (GA, 2 pivs)        Anesthesia Quick Evaluation

## 2023-03-18 NOTE — Anesthesia Procedure Notes (Signed)
Procedure Name: Intubation Date/Time: 03/18/2023 2:46 PM  Performed by: Deri Fuelling, CRNAPre-anesthesia Checklist: Patient identified, Emergency Drugs available, Suction available and Patient being monitored Patient Re-evaluated:Patient Re-evaluated prior to induction Oxygen Delivery Method: Circle system utilized Preoxygenation: Pre-oxygenation with 100% oxygen Induction Type: IV induction Ventilation: Mask ventilation without difficulty Laryngoscope Size: Mac and 3 Grade View: Grade I Tube type: Oral Tube size: 7.0 mm Number of attempts: 1 Airway Equipment and Method: Stylet and Oral airway Placement Confirmation: ETT inserted through vocal cords under direct vision, positive ETCO2 and breath sounds checked- equal and bilateral Secured at: 20 cm Tube secured with: Tape Dental Injury: Teeth and Oropharynx as per pre-operative assessment

## 2023-03-18 NOTE — Op Note (Signed)
OPERATIVE REPORT   03/15/2023 - 03/18/2023  6:57 PM  PATIENT:  Julia Hampton   SURGEON:  Jonette Pesa, MD  ASSISTANT:  Same.   PREOPERATIVE DIAGNOSIS:  Comminuted left [place] C Femur Periprosthetic Fracture  POSTOPERATIVE DIAGNOSIS:  Same.  PROCEDURE:  OPEN REDUCTION INTERNAL FIXATION (ORIF) LEFT FEMUR FRACTURE Interpretation of fluoroscopic images. Placement of negative pressure incisional dressing.  ANESTHESIA:   GETA.  ANTIBIOTICS:  2 g Ancef.  IMPLANTS:  Zimmer NCB distal femur locking plate with 5.0 mm distal interlocking screws x6 and locking caps, 5.0 mm proximal interlocking screw x2, cable buttons with 1.8 mm adult reconstruction cables x3 and 4.0 mm proximal interlocking screw x1.  SPECIMENS:  none.  TUBES AND DRAINS: Prevena negative pressure incisional dressing at 75 mmHg.  BLOOD PRODUCTS: 1 unit PRBCs.  COMPLICATIONS:  none.  DISPOSITION:  Stable to PACU.  SURGICAL INDICATIONS:  Julia Hampton is a 89 y.o. female with a diagnosis of Left Femur Periprosthetic Fracture.   The risks, benefits, and alternatives were discussed with the patient preoperatively including but not limited to the risks of infection, bleeding, nerve / blood vessel injury, malunion, nonunion, cardiopulmonary complications, the need for repeat surgery, among others, and the patient was willing to proceed.  PROCEDURE IN DETAIL: The patient was correctly identified in the holding area using 2 identifiers.  The surgical site was marked by myself.  She was taken to the operating room, and placed supine on the operating room table.  General anesthesia was induced.  She was then flipped to the right lateral decubitus position.  Axillary roll was placed.  All bony prominences were well-padded.  She was secured to the table with a beanbag positioner.  The left hip and femur were prepped and draped in the normal sterile surgical fashion.  Timeout was called, verifying site and site of  surgery.  She did receive IV antibiotics within 60 minutes of beginning the procedure.  I made a longitudinal incision over the entire length of the femur.  Full-thickness skin flaps were created.  The IT band was split in line with fibers.  The vastus lateralis was identified, and dissected sharply off the posterior intermuscular septum.  All perforating vessels were treated with the Aquamantys.  The vastus lateralis was retracted anteriorly.  I identified the periprosthetic fracture.  There was a comminuted long spiral fracture of the femoral shaft, well distal to the tip of the femoral prosthesis.  The fracture was extremely difficult to reduce, as the fracture was length unstable.  I obtained a provisional reduction which was secured with 18-gauge stainless steel wire.  I selected a 15 hole NCB distal femoral locking plate which was slid into place the submuscularly.  The plate was secured to the bone proximally with a 3.2 mm drill bit in the most proximal hole, and I was able to pass the drill bit posterior to the hip stem.  The plate was secured distally with a K wire.  Femur length and hardware position was checked with AP and lateral fluoroscopy views.  The plate was compressed to bone with 2 5.0 mm bicortical interlocking screws proximally.  The plate was also compressed the bone with 6 5.0 mm bicortical interlocking screws distally.  I placed 2 cable buttons proximally over the stem and utilized to 1.8 mm subperiosteal adult reconstruction cables.  I placed a subperiosteal adult reconstruction cable over the fracture site to improve the reduction.  I exchanged the proximal drill bit for a 4.0  mm bicortical interlocking screw.  Locking caps were placed over the distal screws.  Final AP and lateral fluoroscopy views were used to confirm fracture reduction and hardware placement.  The wound was then copiously irrigated with normal saline using pulsatile lavage.  2 g of vancomycin powder were placed over the  femoral hardware and fracture site.  The IT band was closed with #1 Vicryl and #1 strata fix.  The deep fatty tissue was closed with 2-0 Vicryl.  The deep dermal layer was closed with 2-0 Monocryl.  Skin was reapproximated with staples.  Customizable Prevena dressing was placed, and suction was hooked up to 75 mmHg.  There was excellent seal without any leak.  The patient was then flipped supine, extubated, taken to the PACU in stable condition.  Sponge, needle, and instrument counts were correct at the end of the case x 2.  There were no known complications.  The aquamantis was utilized for this case to help facilitate better hemostasis as patient was felt to be at increased risk of bleeding because of preop anemia.  Please note that a surgical assistant was a medical necessity for this procedure to perform it in a safe and expeditious manner. Assistant was necessary to provide appropriate retraction of vital neurovascular structures, to prevent femoral fracture, and to allow for anatomic placement of the prosthesis.

## 2023-03-19 ENCOUNTER — Inpatient Hospital Stay (HOSPITAL_COMMUNITY): Payer: Medicare PPO

## 2023-03-19 ENCOUNTER — Encounter (HOSPITAL_COMMUNITY): Payer: Self-pay | Admitting: Orthopedic Surgery

## 2023-03-19 DIAGNOSIS — F0150 Vascular dementia without behavioral disturbance: Secondary | ICD-10-CM | POA: Diagnosis not present

## 2023-03-19 DIAGNOSIS — I10 Essential (primary) hypertension: Secondary | ICD-10-CM | POA: Diagnosis not present

## 2023-03-19 DIAGNOSIS — S7292XA Unspecified fracture of left femur, initial encounter for closed fracture: Secondary | ICD-10-CM | POA: Diagnosis not present

## 2023-03-19 DIAGNOSIS — E876 Hypokalemia: Secondary | ICD-10-CM | POA: Diagnosis not present

## 2023-03-19 LAB — TYPE AND SCREEN
ABO/RH(D): O POS
Antibody Screen: NEGATIVE
Unit division: 0

## 2023-03-19 LAB — COMPREHENSIVE METABOLIC PANEL
ALT: 52 U/L — ABNORMAL HIGH (ref 0–44)
AST: 96 U/L — ABNORMAL HIGH (ref 15–41)
Albumin: 3.2 g/dL — ABNORMAL LOW (ref 3.5–5.0)
Alkaline Phosphatase: 76 U/L (ref 38–126)
Anion gap: 5 (ref 5–15)
BUN: 17 mg/dL (ref 8–23)
CO2: 26 mmol/L (ref 22–32)
Calcium: 8.1 mg/dL — ABNORMAL LOW (ref 8.9–10.3)
Chloride: 109 mmol/L (ref 98–111)
Creatinine, Ser: 0.62 mg/dL (ref 0.44–1.00)
GFR, Estimated: >60 mL/min (ref >60)
Glucose, Bld: 153 mg/dL — ABNORMAL HIGH (ref 70–99)
Potassium: 4.3 mmol/L (ref 3.5–5.1)
Sodium: 140 mmol/L (ref 135–145)
Total Bilirubin: 1.1 mg/dL (ref 0.3–1.2)
Total Protein: 6.3 g/dL — ABNORMAL LOW (ref 6.5–8.1)

## 2023-03-19 LAB — CBC
HCT: 32.8 % — ABNORMAL LOW (ref 36.0–46.0)
Hemoglobin: 10.1 g/dL — ABNORMAL LOW (ref 12.0–15.0)
MCH: 27.6 pg (ref 26.0–34.0)
MCHC: 30.8 g/dL (ref 30.0–36.0)
MCV: 89.6 fL (ref 80.0–100.0)
Platelets: 169 10*3/uL (ref 150–400)
RBC: 3.66 MIL/uL — ABNORMAL LOW (ref 3.87–5.11)
RDW: 15 % (ref 11.5–15.5)
WBC: 8.4 10*3/uL (ref 4.0–10.5)
nRBC: 0 % (ref 0.0–0.2)

## 2023-03-19 LAB — BPAM RBC
Blood Product Expiration Date: 202407052359
ISSUE DATE / TIME: 202405301710
Unit Type and Rh: 5100

## 2023-03-19 MED ORDER — ASPIRIN 81 MG PO CHEW: 81.0000 mg | CHEWABLE_TABLET | Freq: Two times a day (BID) | ORAL | 0 refills | Status: AC

## 2023-03-19 MED ORDER — POLYETHYLENE GLYCOL 3350 17 G PO PACK
17.0000 g | PACK | Freq: Every day | ORAL | Status: DC
  Filled 2023-03-19 (×3): qty 1

## 2023-03-19 MED ORDER — HYDROCODONE-ACETAMINOPHEN 5-325 MG PO TABS: 1.0000 | ORAL_TABLET | ORAL | 0 refills | Status: AC | PRN

## 2023-03-19 NOTE — TOC Progression Note (Addendum)
Transition of Care [facility]) - Progression Note    Patient Details  Name: Julia Hampton MRN: [id number] Date of Birth: 05-Feb-1930  Transition of [facility] CM/SW Contact  Amada Jupiter, LCSW Phone Number: 03/19/2023, 2:10 PM  Clinical Narrative:     Spoke with pt and son today to review dc plan for return to [facility].  Both in agreement and hopeful we can secure insurance auth for rehab to be done upon her return.  Son confirms that pt was mod independent prior to this fall and hopeful she can return to that level of function. Have confirmed with [facility] that they can readmit pt when medically cleared.    ADDENDUM: Have begun insurance authorization with anticipated dc 03/22/23.  Expected Discharge Plan: Skilled Nursing Facility Barriers to Discharge: Continued Medical Work up  Expected Discharge Plan and Services In-house Referral: Clinical Social Work   Post Acute Care Choice: Skilled Nursing Facility Living arrangements for the past 2 months: Skilled Nursing Facility                 DME Arranged: N/A DME Agency: NA                   Social Determinants of Health (SDOH) Interventions SDOH Screenings   Food Insecurity: No Food Insecurity (03/15/2023)  Housing: Low Risk  (03/15/2023)  Transportation Needs: No Transportation Needs (03/15/2023)  Utilities: Not At Risk (03/15/2023)  Tobacco Use: Low Risk  (03/19/2023)    Readmission Risk Interventions    10/27/2022    3:07 PM  Readmission Risk Prevention Plan  Post Dischage Appt Complete  Medication Screening Complete  Transportation Screening Complete
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
## 2023-03-19 NOTE — [place] FL2 (Signed)
[place] MEDICAID FL2 LEVEL OF CARE FORM     IDENTIFICATION  Patient Name: Julia Hampton Birthdate: [birth year] Sex: female Admission Date (Current Location): 03/15/2023  [facility] and [place] Number:  Producer, television/film/video and Address:  [facility],  [address] [place], [place] [address]      Provider Number: [id number]  Attending Physician Name and Address:  Cathren Harsh, MD  Relative Name and Phone Number:  Teniola Esposito (son) Ph: [phone]    Current Level of Care: Hospital Recommended Level of Care: Skilled Nursing Facility Prior Approval Number:    Date Approved/Denied:   PASRR Number: [id number]  Discharge Plan: SNF    Current Diagnoses: Patient Active Problem List   Diagnosis Date Noted   Femur fracture, left (HCC) 03/15/2023   Leukocytosis 03/15/2023   Anemia 03/15/2023   Hypokalemia 03/15/2023   Bronchiectasis without complication (HCC) 12/11/2022   Dementia (HCC) 10/27/2022   Acute pulmonary embolism (HCC) 10/24/2022   Glaucoma 10/24/2022   Fall at home, initial encounter 10/24/2022   DVT (deep venous thrombosis) (HCC) 10/24/2022   Underweight 01/16/2017   Multifocal pneumonia 01/14/2017   Unspecified constipation 09/30/2013   Hip fracture (HCC) 09/28/2013   Essential hypertension 09/28/2013   UTI (urinary tract infection) 09/28/2013    Orientation RESPIRATION BLADDER Height & Weight     Self, Time, Situation, Place  Normal Continent Weight: 104 lb 0.9 oz (47.2 kg) Height:  5\' 1"  (154.9 cm)  BEHAVIORAL SYMPTOMS/MOOD NEUROLOGICAL BOWEL NUTRITION STATUS      Continent Diet (Regular diet)  AMBULATORY STATUS COMMUNICATION OF NEEDS Skin   Extensive Assist Verbally Other (Comment) (surgical incision only)                       Personal Care Assistance Level of Assistance  Bathing, Feeding, Dressing Bathing Assistance: Limited assistance Feeding assistance: Independent Dressing Assistance: Limited assistance     Functional  Limitations Info  Sight, Hearing, Speech Sight Info: Impaired Hearing Info: Impaired Speech Info: Adequate    SPECIAL CARE FACTORS FREQUENCY  PT (By licensed PT), OT (By licensed OT)     PT Frequency: 5x/wk OT Frequency: 5x/wk            Contractures Contractures Info: Not present    Additional Factors Info  Code Status, Allergies Code Status Info: DNR Allergies Info: Other           Current Medications (03/19/2023):  This is the current hospital active medication list Current Facility-Administered Medications  Medication Dose Route Frequency Provider Last Rate Last Admin   0.9 %  sodium chloride infusion   Intravenous Continuous Swinteck, Arlys John, MD   Stopped at 03/19/23 1509   acetaminophen (TYLENOL) tablet 325-650 mg  325-650 mg Oral Q6H PRN Samson Frederic, MD   650 mg at 03/19/23 0844   albuterol (PROVENTIL) (2.5 MG/3ML) 0.083% nebulizer solution 2.5 mg  2.5 mg Nebulization Q6H PRN Swinteck, Arlys John, MD       brimonidine (ALPHAGAN) 0.2 % ophthalmic solution 1 drop  1 drop Left Eye TID Samson Frederic, MD   1 drop at 03/19/23 1530   Chlorhexidine Gluconate Cloth 2 % PADS 6 each  6 each Topical Daily Samson Frederic, MD   6 each at 03/19/23 0841   citalopram (CELEXA) tablet 20 mg  20 mg Oral Daily Samson Frederic, MD   20 mg at 03/19/23 0845   docusate sodium (COLACE) capsule 100 mg  100 mg Oral BID Samson Frederic, MD  100 mg at 03/19/23 0845   dorzolamide-timolol (COSOPT) 2-0.5 % ophthalmic solution 1 drop  1 drop Both Eyes BID Samson Frederic, MD   1 drop at 03/19/23 0839   enoxaparin (LOVENOX) injection 30 mg  30 mg Subcutaneous Q24H Samson Frederic, MD   30 mg at 03/19/23 4098   feeding supplement (ENSURE ENLIVE / ENSURE PLUS) liquid 237 mL  237 mL Oral BID BM Swinteck, Arlys John, MD   237 mL at 03/19/23 1529   HYDROcodone-acetaminophen (NORCO) 7.5-325 MG per tablet 1-2 tablet  1-2 tablet Oral Q4H PRN Samson Frederic, MD       HYDROcodone-acetaminophen (NORCO/VICODIN) 5-325  MG per tablet 1-2 tablet  1-2 tablet Oral Q4H PRN Samson Frederic, MD   1 tablet at 03/19/23 1014   latanoprost (XALATAN) 0.005 % ophthalmic solution 1 drop  1 drop Left Eye QHS Swinteck, Arlys John, MD   1 drop at 03/18/23 2254   menthol-cetylpyridinium (CEPACOL) lozenge 3 mg  1 lozenge Oral PRN Samson Frederic, MD       Or   phenol (CHLORASEPTIC) mouth spray 1 spray  1 spray Mouth/Throat PRN Swinteck, Arlys John, MD       methocarbamol (ROBAXIN) tablet 500 mg  500 mg Oral Q6H PRN Samson Frederic, MD   500 mg at 03/19/23 1014   Or   methocarbamol (ROBAXIN) 500 mg in dextrose 5 % 50 mL IVPB  500 mg Intravenous Q6H PRN Swinteck, Arlys John, MD       metoCLOPramide (REGLAN) tablet 5-10 mg  5-10 mg Oral Q8H PRN Swinteck, Arlys John, MD       Or   metoCLOPramide (REGLAN) injection 5-10 mg  5-10 mg Intravenous Q8H PRN Swinteck, Arlys John, MD       mirtazapine (REMERON) tablet 7.5 mg  7.5 mg Oral QHS Swinteck, Arlys John, MD   7.5 mg at 03/18/23 2251   morphine (PF) 2 MG/ML injection 0.5-1 mg  0.5-1 mg Intravenous Q2H PRN Samson Frederic, MD       multivitamin with minerals tablet 1 tablet  1 tablet Oral Daily Swinteck, Arlys John, MD   1 tablet at 03/19/23 0845   naloxone [facility]) injection 0.4 mg  0.4 mg Intravenous PRN Samson Frederic, MD       ondansetron [facility]) tablet 4 mg  4 mg Oral Q6H PRN Samson Frederic, MD       Or   ondansetron [facility]) injection 4 mg  4 mg Intravenous Q6H PRN Samson Frederic, MD       Oral care mouth rinse  15 mL Mouth Rinse PRN Swinteck, Arlys John, MD       pilocarpine (PILOCAR) 4 % ophthalmic solution 1 drop  1 drop Left Eye TID Samson Frederic, MD   1 drop at 03/19/23 1531   polyethylene glycol (MIRALAX / GLYCOLAX) packet 17 g  17 g Oral Daily Rai, Ripudeep K, MD       polyvinyl alcohol (LIQUIFILM TEARS) 1.4 % ophthalmic solution 1 drop  1 drop Both Eyes Q12H PRN Swinteck, Arlys John, MD       senna (SENOKOT) tablet 8.6 mg  1 tablet Oral BID Samson Frederic, MD   8.6 mg at 03/19/23 0845     Discharge  Medications: Please see discharge summary for a list of discharge medications.  Relevant Imaging Results:  Relevant Lab Results:   Additional Information SSN: [id number]  Amada Jupiter, LCSW

## 2023-03-19 NOTE — Progress Notes (Addendum)
    Subjective:  Patient reports pain as mild to moderate.  Denies N/V/CP/SOB/Abd pain. She reports some pain in her hip. She has not had any pain medication. We discussed for her to ask for pain medication. She denies any tingling or numbness in LE bilaterally.   Objective:   VITALS:   Vitals:   03/18/23 2313 03/19/23 0138 03/19/23 0300 03/19/23 0523  BP: 108/67 128/69  122/68  Pulse: 69 71  69  Resp: 16 16  17   Temp: 97.6 F (36.4 C) (!) 96.4 F (35.8 C) (!) 96.5 F (35.8 C) (!) 97.1 F (36.2 C)  TempSrc:   Axillary   SpO2: 100% 100%  100%  Weight:      Height:        Patient is lying in bed. NAD.  Neurologically intact ABD soft Neurovascular intact Sensation intact distally Intact pulses distally Dorsiflexion/Plantar flexion intact No cellulitis present Compartment soft Prevena dressing, C/D/I no leaks detected. House vac suction unit on .   Lab Results  Component Value Date   WBC 8.4 03/19/2023   HGB 10.1 (L) 03/19/2023   HCT 32.8 (L) 03/19/2023   MCV 89.6 03/19/2023   PLT 169 03/19/2023   BMET    Component Value Date/Time   NA 140 03/19/2023 0331   K 4.3 03/19/2023 0331   CL 109 03/19/2023 0331   CO2 26 03/19/2023 0331   GLUCOSE 153 (H) 03/19/2023 0331   BUN 17 03/19/2023 0331   CREATININE 0.62 03/19/2023 0331   CALCIUM 8.1 (L) 03/19/2023 0331   GFRNONAA >60 03/19/2023 0331     Assessment/Plan: 1 Day Post-Op   Principal Problem:   Femur fracture, left (HCC) Active Problems:   Essential hypertension   Dementia (HCC)   Leukocytosis   Anemia   Hypokalemia   Touchdown weightbearing with walker. Posterior hip precautions.  DVT ppx: Lovenox, SCDs, TEDS PO pain control PT/OT: PT has not seen yet.  Dispo: Patient under care of the medical team, disposition per their recommendation. Pain medication and DVT ppx printed in chart.  - Patient to remain on house vac suction unit. Nurse to convert house vac suction unit to prevena portable vac  upon discharge. Patient to follow-up within 7 days in office for removal of negative pressure dressing.    Clois Dupes, PA-C 03/19/2023, 8:31 AM   [facility]  [place] [address], [address], [place], [place] [address] Phone: [phone] [email or web address] Facebook  [employer]
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
## 2023-03-19 NOTE — Plan of Care (Signed)
  Problem: Clinical Measurements: Goal: Ability to maintain clinical measurements within normal limits will improve Outcome: Progressing   Problem: Pain Managment: Goal: General experience of comfort will improve Outcome: Progressing   Problem: Pain Management: Goal: Pain level will decrease Outcome: Progressing   

## 2023-03-19 NOTE — Discharge Instructions (Addendum)
Dr. Samson Frederic Joint Replacement Specialist [facility] [address] [place], [place] [address] [phone]   POSTOPERATIVE DIRECTIONS    Hip Rehabilitation, Guidelines Following Surgery   WEIGHT BEARING Other:  Touchdown weight bearing left lower extremity with walker.   The results of a hip operation are greatly improved after range of motion and muscle strengthening exercises. Follow all safety measures which are given to protect your hip. If any of these exercises cause increased pain or swelling in your joint, decrease the amount until you are comfortable again. Then slowly increase the exercises. Call your caregiver if you have problems or questions.   HOME CARE INSTRUCTIONS  Most of the following instructions are designed to prevent the dislocation of your new hip.  Remove items at home which could result in a fall. This includes throw rugs or furniture in walking pathways.  Continue medications as instructed at time of discharge. You may have some home medications which will be placed on hold until you complete the course of blood thinner medication.  Do not remove your dressing. Do not put on socks or shoes without following the instructions of your caregivers.   Sit on chairs with arms. Use the chair arms to help push yourself up when arising.  Arrange for the use of a toilet seat elevator so you are not sitting low.  You may resume a sexual relationship in one month or when given the OK by your caregiver.  Use walker as long as suggested by your caregivers.  Avoid periods of inactivity such as sitting longer than an hour when not asleep. This helps prevent blood clots.  You may return to work once you are cleared by Designer, industrial/product.  Do not drive a car for 6 weeks or until released by your surgeon.  Do not drive while taking narcotics.  Wear elastic stockings for two weeks following surgery during the day but you may remove then at night.   Make sure you keep all of your appointments after your operation with all of your doctors and caregivers. You should call the office at the above phone number and make an appointment for approximately two weeks after the date of your surgery. Please pick up a stool softener and laxative for home use as long as you are requiring pain medications. ICE to the affected hip every three hours for 30 minutes at a time and then as needed for pain and swelling. Continue to use ice on the hip for pain and swelling from surgery. You may notice swelling that will progress down to the foot and ankle.  This is normal after surgery.  Elevate the leg when you are not up walking on it.   It is important for you to complete the blood thinner medication as prescribed by your doctor. Continue to use the breathing machine which will help keep your temperature down.  It is common for your temperature to cycle up and down following surgery, especially at night when you are not up moving around and exerting yourself.  The breathing machine keeps your lungs expanded and your temperature down.  RANGE OF MOTION AND STRENGTHENING EXERCISES  These exercises are designed to help you keep full movement of your hip joint. Follow your caregiver's or physical therapist's instructions. Perform all exercises about fifteen times, three times per day or as directed. Exercise both hips, even if you have had only one joint replacement. These exercises can be done on a training (exercise) mat,  on the floor, on a table or on a bed. Use whatever works the best and is most comfortable for you. Use music or television while you are exercising so that the exercises are a pleasant break in your day. This will make your life better with the exercises acting as a break in routine you can look forward to.  Lying on your back, slowly slide your foot toward your buttocks, raising your knee up off the floor. Then slowly slide your foot back down until your  leg is straight again.  Lying on your back spread your legs as far apart as you can without causing discomfort.  Lying on your side, raise your upper leg and foot straight up from the floor as far as is comfortable. Slowly lower the leg and repeat.  Lying on your back, tighten up the muscle in the front of your thigh (quadriceps muscles). You can do this by keeping your leg straight and trying to raise your heel off the floor. This helps strengthen the largest muscle supporting your knee.  Lying on your back, tighten up the muscles of your buttocks both with the legs straight and with the knee bent at a comfortable angle while keeping your heel on the floor.   SKILLED REHAB INSTRUCTIONS: If the patient is transferred to a skilled rehab facility following release from the hospital, a list of the current medications will be sent to the facility for the patient to continue.  When discharged from the skilled rehab facility, please have the facility set up the patient's Home Health Physical Therapy prior to being released. Also, the skilled facility will be responsible for providing the patient with their medications at time of release from the facility to include their pain medication and their blood thinner medication. If the patient is still at the rehab facility at time of the two week follow up appointment, the skilled rehab facility will also need to assist the patient in arranging follow up appointment in our office and any transportation needs.  POST-OPERATIVE OPIOID TAPER INSTRUCTIONS: It is important to wean off of your opioid medication as soon as possible. If you do not need pain medication after your surgery it is ok to stop day one. Opioids include: Codeine, Hydrocodone(Norco, Vicodin), Oxycodone(Percocet, oxycontin) and hydromorphone amongst others.  Long term and even short term use of opiods can cause: Increased pain response Dependence Constipation Depression Respiratory  depression And more.  Withdrawal symptoms can include Flu like symptoms Nausea, vomiting And more Techniques to manage these symptoms Hydrate well Eat regular healthy meals Stay active Use relaxation techniques(deep breathing, meditating, yoga) Do Not substitute Alcohol to help with tapering If you have been on opioids for less than two weeks and do not have pain than it is ok to stop all together.  Plan to wean off of opioids This plan should start within one week post op of your joint replacement. Maintain the same interval or time between taking each dose and first decrease the dose.  Cut the total daily intake of opioids by one tablet each day Next start to increase the time between doses. The last dose that should be eliminated is the evening dose.    MAKE SURE YOU:  Understand these instructions.  Will watch your condition.  Will get help right away if you are not doing well or get worse.  Pick up stool softner and laxative for home use following surgery while on pain medications. Do not remove your dressing. Continue to  use ice for pain and swelling after surgery. Do not use any lotions or creams on the incision until instructed by your surgeon. Touchdown weightbearing left lower extremity with walker.  Keep dressing clean and dry.  Please charge your prevena wound vac nightly.  Follow-up within 7 days for removal of your negative pressure prevena dressing.

## 2023-03-19 NOTE — Plan of Care (Signed)

## 2023-03-20 DIAGNOSIS — S7292XD Unspecified fracture of left femur, subsequent encounter for closed fracture with routine healing: Secondary | ICD-10-CM | POA: Diagnosis not present

## 2023-03-20 DIAGNOSIS — D72829 Elevated white blood cell count, unspecified: Secondary | ICD-10-CM

## 2023-03-20 DIAGNOSIS — I10 Essential (primary) hypertension: Secondary | ICD-10-CM | POA: Diagnosis not present

## 2023-03-20 DIAGNOSIS — F03A0 Unspecified dementia, mild, without behavioral disturbance, psychotic disturbance, mood disturbance, and anxiety: Secondary | ICD-10-CM

## 2023-03-20 DIAGNOSIS — D649 Anemia, unspecified: Secondary | ICD-10-CM

## 2023-03-20 LAB — COMPREHENSIVE METABOLIC PANEL
ALT: 33 U/L (ref 0–44)
AST: 48 U/L — ABNORMAL HIGH (ref 15–41)
Albumin: 2.7 g/dL — ABNORMAL LOW (ref 3.5–5.0)
Alkaline Phosphatase: 60 U/L (ref 38–126)
Anion gap: 7 (ref 5–15)
BUN: 26 mg/dL — ABNORMAL HIGH (ref 8–23)
CO2: 24 mmol/L (ref 22–32)
Calcium: 8.1 mg/dL — ABNORMAL LOW (ref 8.9–10.3)
Chloride: 108 mmol/L (ref 98–111)
Creatinine, Ser: 0.64 mg/dL (ref 0.44–1.00)
GFR, Estimated: >60 mL/min (ref >60)
Glucose, Bld: 110 mg/dL — ABNORMAL HIGH (ref 70–99)
Potassium: 3.6 mmol/L (ref 3.5–5.1)
Sodium: 139 mmol/L (ref 135–145)
Total Bilirubin: 0.7 mg/dL (ref 0.3–1.2)
Total Protein: 5.6 g/dL — ABNORMAL LOW (ref 6.5–8.1)

## 2023-03-20 LAB — CBC
HCT: 25.7 % — ABNORMAL LOW (ref 36.0–46.0)
Hemoglobin: 8 g/dL — ABNORMAL LOW (ref 12.0–15.0)
MCH: 28 pg (ref 26.0–34.0)
MCHC: 31.1 g/dL (ref 30.0–36.0)
MCV: 89.9 fL (ref 80.0–100.0)
Platelets: 205 10*3/uL (ref 150–400)
RBC: 2.86 MIL/uL — ABNORMAL LOW (ref 3.87–5.11)
RDW: 15.3 % (ref 11.5–15.5)
WBC: 9.9 10*3/uL (ref 4.0–10.5)
nRBC: 0 % (ref 0.0–0.2)

## 2023-03-20 LAB — HCV INTERPRETATION

## 2023-03-20 LAB — HEPATITIS PANEL, ACUTE: Hep A IgM: NEGATIVE — AB

## 2023-03-20 NOTE — Progress Notes (Signed)
Subjective: 2 Days Post-Op Procedure(s) (LRB): OPEN REDUCTION INTERNAL FIXATION (ORIF) LEFT FEMUR FRACTURE (Left) Patient reports pain as moderate.  Reports thigh pain. No other c/o.  Objective: Vital signs in last 24 hours: Temp:  [97.7 F (36.5 C)-98.8 F (37.1 C)] 98.3 F (36.8 C) (06/01 0611) Pulse Rate:  [74-104] 80 (06/01 0611) Resp:  [17-18] 18 (06/01 0611) BP: (113-129)/(58-67) 122/67 (06/01 0611) SpO2:  [93 %-100 %] 100 % (06/01 0611)  Intake/Output from previous day: 05/31 0701 - 06/01 0700 In: 1060.6 [P.O.:640; I.V.:420.6] Out: 100 [Urine:100] Intake/Output this shift: No intake/output data recorded.  Recent Labs    03/18/23 0321 03/18/23 1648 03/18/23 2023 03/19/23 0331 03/20/23 0309  HGB 9.3* 7.8* 10.1* 10.1* 8.0*   Recent Labs    03/19/23 0331 03/20/23 0309  WBC 8.4 9.9  RBC 3.66* 2.86*  HCT 32.8* 25.7*  PLT 169 205   Recent Labs    03/19/23 0331 03/20/23 0309  NA 140 139  K 4.3 3.6  CL 109 108  CO2 26 24  BUN 17 26*  CREATININE 0.62 0.64  GLUCOSE 153* 110*  CALCIUM 8.1* 8.1*   No results for input(s): "LABPT", "INR" in the last 72 hours.  Neurologically intact ABD soft Neurovascular intact Sensation intact distally Intact pulses distally Dorsiflexion/Plantar flexion intact Incision: dressing C/D/I No cellulitis present Compartment soft No sign of DVT   Assessment/Plan: 2 Days Post-Op Procedure(s) (LRB): OPEN REDUCTION INTERNAL FIXATION (ORIF) LEFT FEMUR FRACTURE (Left) Advance diet Up with therapy D/C IV fluids Continue TDWB LLE and posterior hip precautions Prevena dressing at ASA for DVT ppx  Dorothy Spark 03/20/2023, 8:50 AM

## 2023-03-20 NOTE — Plan of Care (Signed)
  Problem: Coping: Goal: Level of anxiety will decrease Outcome: Progressing   Problem: Pain Managment: Goal: General experience of comfort will improve Outcome: Progressing   Problem: Safety: Goal: Ability to remain free from injury will improve Outcome: Progressing   

## 2023-03-20 NOTE — Progress Notes (Addendum)
AUTH approved. Facility made aware.

## 2023-03-20 NOTE — Progress Notes (Signed)
PROGRESS NOTE    Julia Hampton  ZOX:[id number] DOB: 13-Dec-1929 DOA: 03/15/2023 PCP: Soundra Pilon, FNP   Brief Narrative:  Julia Hampton is a 89 y.o. 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   Patient was evaluated by the orthopedic surgical planning for open reduction internal fixation.  Patient tolerated ORIF 03/18/2023, continues to work with PT and OT who recommend ongoing rehab at [facility].  Patient has tentatively been accepted on Monday, 03/22/2023.  She remained in the hospital in the meantime given no safe disposition otherwise and ongoing need for close monitoring and physical therapy that cannot be provided at home.  Assessment & Plan:   Principal Problem:   Femur fracture, left (HCC) Active Problems:   Essential hypertension   Dementia (HCC)   Leukocytosis   Anemia   Hypokalemia  Femur fracture, left (HCC)/ Oblique mildly displaced and angulated left femoral shaft fracture distal to the femoral stem of hip arthroplasty - Secondary to a mechanical fall.   -Ortho following, status post ORIF left femur fracture on 03/18/2023, tolerated well -Continue PT as tolerated, recommending SNF -DVT prophylaxis and pain control per Ortho -patient tolerating PT on low-dose hydrocodone, methocarbamol  Mild leukocytosis -Resolved, likely reactive   Mild transaminitis -Unclear etiology -Hepatitis panel pending -RUQ ultrasound showed a mildly dilated CBD measuring 9 mm can be seen with aging and in the setting of prior cholecystectomy, mildly coarsened liver echotexture with mildly nodular contour's could reflect presence of fibrosis/developing cirrhosis. -Patient has no abdominal symptoms, no nausea vomiting or abdominal pain, no hyperbilirubinemia or elevated alk phos,  obstructive pattern - d/w on-call GI, Dr. Levora Angel, patient was seen by [facility] in 2020 for the same/ mildly dilated CBD, has prior cholecystectomy, so no inpatient workup needed.  However patient needs to follow-up with her gastroenterologist regarding developing cirrhosis.    Acute on chronic anemia likely complicated by recent procedure -Baseline between 9 and 10  -Hemoglobin dropped minimally overnight currently 8.0, no signs or symptoms of bleeding, likely secondary to recent procedure as well as somewhat hemodilutional given recent IV fluids   Mild hypokalemia -Continue to follow   Hypertension: -BP stable   Dementia without behavioral component:  - Delirium precautions.  Currently at baseline.   Nodular bronchiectasis, subclinical NTM infection:  -Stable, followed by outpatient pulmonology.   Glaucoma -Continue outpatient ophthalmic drops   History of PE, provoked -Patient reports having completed Eliquis course 3 weeks ago   Mild protein calorie malnutrition Nutrition Problem: Inadequate oral intake Etiology: poor appetite Signs/Symptoms: meal completion < 25%, per patient/family report, mild muscle depletion, moderate muscle depletion, mild fat depletion Interventions: Ensure Enlive (each supplement provides 350kcal and 20 grams of protein), MVI, Refer to RD note for recommendations, Education   Estimated body mass index is 19.66 kg/m as calculated from the following:   Height as of this encounter: 5\' 1"  (1.549 m).   Weight as of this encounter: 47.2 kg.   DVT prophylaxis: enoxaparin (LOVENOX) injection 30 mg Start: 03/19/23 0800 SCDs Start: 03/18/23 2047 Code Status: DNR Family Communication: None present  Status is: Inpatient  Dispo: The patient is from: Assisted living facility              Anticipated d/c is to: SNF  Anticipated d/c date is: 03/22/2023              Patient currently is medically stable for discharge, currently awaiting safe  disposition in the form of nursing facility as outlined above  Consultants:  Orthopedic surgery  Procedures:  ORIF left femur 5/30  Antimicrobials:  Perioperatively  Subjective: No acute issues or events overnight denies nausea vomiting diarrhea constipation any fevers chills or chest pain  Objective: Vitals:   03/19/23 1408 03/19/23 1733 03/19/23 2220 03/20/23 0611  BP: 113/65 129/63 (!) 126/58 122/67  Pulse: 84 80 (!) 104 80  Resp: 17 17 17 18   Temp: 97.9 F (36.6 C) 98.1 F (36.7 C) 98.8 F (37.1 C) 98.3 F (36.8 C)  TempSrc:   Oral Oral  SpO2: 100% 93% 94% 100%  Weight:      Height:        Intake/Output Summary (Last 24 hours) at 03/20/2023 0710 Last data filed at 03/19/2023 2200 Gross per 24 hour  Intake 1000.57 ml  Output 100 ml  Net 900.57 ml   Filed Weights   03/15/23 2321  Weight: 47.2 kg    Examination:  General:  Pleasantly resting in bed, No acute distress. HEENT:  Normocephalic atraumatic.  Sclerae nonicteric, noninjected.  Extraocular movements intact bilaterally. Neck:  Without mass or deformity.  Trachea is midline. Lungs:  Clear to auscultate bilaterally without rhonchi, wheeze, or rales. Heart:  Regular rate and rhythm.  Without murmurs, rubs, or gallops. Abdomen:  Soft, nontender, nondistended.  Without guarding or rebound. Extremities: Without cyanosis, clubbing, edema, or obvious deformity. Vascular:  Dorsalis pedis and posterior tibial pulses palpable bilaterally. Skin:  Warm and dry, no erythema, no ulcerations.    Data Reviewed: I have personally reviewed following labs and imaging studies  CBC: Recent Labs  Lab 03/15/23 2101 03/16/23 0326 03/17/23 0319 03/18/23 0321 03/18/23 1648 03/18/23 2023 03/19/23 0331 03/20/23 0309  WBC 12.3*   < > 8.1 8.7  --  10.9* 8.4 9.9  NEUTROABS 9.8*  --  5.5  --   --   --   --   --   HGB 10.9*   < > 9.9* 9.3* 7.8* 10.1* 10.1* 8.0*  HCT 34.7*   < > 31.9* 29.9* 23.0* 32.8* 32.8* 25.7*  MCV  87.8   < > 89.6 90.1  --  89.1 89.6 89.9  PLT 269   < > 226 189  --  181 169 205   < > = values in this interval not displayed.   Basic Metabolic Panel: Recent Labs  Lab 03/16/23 0326 03/17/23 0319 03/18/23 0321 03/18/23 1648 03/19/23 0331 03/20/23 0309  NA 134* 138 137 139 140 139  K 3.8 3.8 4.2 4.0 4.3 3.6  CL 102 108 106 104 109 108  CO2 22 25 25   --  26 24  GLUCOSE 153* 109* 98 102* 153* 110*  BUN 21 23 26* 17 17 26*  CREATININE 0.77 0.70 0.63 0.50 0.62 0.64  CALCIUM 8.6* 8.5* 8.3*  --  8.1* 8.1*   GFR: Estimated Creatinine Clearance: 34.1 mL/min (by C-G formula based on SCr of 0.64 mg/dL). Liver Function Tests: Recent Labs  Lab 03/17/23 0319 03/18/23 0321 03/19/23 0331 03/20/23 0309  AST 200* 63* 96* 48*  ALT 71* 41 52* 33  ALKPHOS 82 74 76 60  BILITOT 1.0 0.8 1.1 0.7  PROT 6.5 6.2* 6.3* 5.6*  ALBUMIN 3.1* 2.8* 3.2* 2.7*   BNP (last 3 results) Recent Labs  12/11/22 1503  PROBNP 43.0    Recent Results (from the past 240 hour(s))  Surgical PCR screen     Status: None   Collection Time: 03/17/23 10:00 AM   Specimen: Nasal Mucosa; Nasal Swab  Result Value Ref Range Status   MRSA, PCR NEGATIVE NEGATIVE Final   Staphylococcus aureus NEGATIVE NEGATIVE Final    Comment: (NOTE) The Xpert SA Assay (FDA approved for NASAL specimens in patients 87 years of age and older), is one component of a comprehensive surveillance program. It is not intended to diagnose infection nor to guide or monitor treatment. Performed at [facility], [address], [place], [place] [address]          Radiology Studies: US Abdomen Limited RUQ (LIVER/GB)  Result Date: 03/19/2023 CLINICAL DATA:  [id number] Transaminitis [id number] EXAM: ULTRASOUND ABDOMEN LIMITED RIGHT UPPER QUADRANT COMPARISON:  None Available. FINDINGS: Gallbladder: Surgically absent. Common bile duct: Diameter: 9.0 mm. Liver: Mildly coarsened echotexture and mildly nodular contours. Portal vein  is patent on color Doppler imaging with normal direction of blood flow towards the liver. Other: None. IMPRESSION: Mildly dilated common bile duct measuring 9.0 mm, can be seen with aging and in the setting of prior cholecystectomy. Correlate with LFTs for any signs of biliary obstruction. Mildly coarsened liver echotexture with mildly nodular contours, could reflect the presence of fibrosis/developing cirrhosis. Consider follow-up hepatic ultrasound with elastography as clinically indicated. Electronically Signed   By: Caprice Renshaw M.D.   On: 03/19/2023 09:32   DG FEMUR PORT MIN 2 VIEWS LEFT  Result Date: 03/18/2023 CLINICAL DATA:  Status post surgery. EXAM: LEFT FEMUR PORTABLE 2 VIEWS COMPARISON:  None Available. FINDINGS: Status post chronic left hip arthroplasty. Status post ORIF with new plate and screw fixation with cerclage wires. Diffuse osteopenia. Subcutaneous emphysema and multiple surgical clips about the lateral aspect of the leg. Surgical drain in place. IMPRESSION: Status post left femoral ORIF with new plate and screw fixation with cerclage wires. Electronically Signed   By: Larose Hires D.O.   On: 03/18/2023 20:02   DG FEMUR MIN 2 VIEWS LEFT  Result Date: 03/18/2023 CLINICAL DATA:  Known femoral fracture EXAM: LEFT FEMUR 2 VIEWS COMPARISON:  03/15/2023 FLUOROSCOPY TIME:  Radiation Exposure Index (as provided by the fluoroscopic device): 5.26 mGy If the device does not provide the exposure index: Fluoroscopy Time:  1 minute 45 seconds Number of Acquired Images:  34 FINDINGS: Initial images again demonstrate the previously seen mid to distal left femoral fracture. Fixation sideplate was then placed laterally along the distal femur. Fixation screws and cerclage wires were placed. Fracture fragments are in near anatomic alignment. IMPRESSION: ORIF of left femoral fracture Electronically Signed   By: Alcide Clever M.D.   On: 03/18/2023 19:59   DG C-Arm 1-60 Min-No Report  Result Date:  03/18/2023 Fluoroscopy was utilized by the requesting physician.  No radiographic interpretation.   DG C-Arm 1-60 Min-No Report  Result Date: 03/18/2023 Fluoroscopy was utilized by the requesting physician.  No radiographic interpretation.   DG C-Arm 1-60 Min-No Report  Result Date: 03/18/2023 Fluoroscopy was utilized by the requesting physician.  No radiographic interpretation.        Scheduled Meds:  brimonidine  1 drop Left Eye TID   Chlorhexidine Gluconate Cloth  6 each Topical Daily   citalopram  20 mg Oral Daily   docusate sodium  100 mg Oral BID   dorzolamide-timolol  1 drop Both Eyes BID   enoxaparin (LOVENOX) injection  30 mg Subcutaneous Q24H   feeding supplement  237 mL Oral BID BM   latanoprost  1 drop Left Eye QHS   mirtazapine  7.5 mg Oral QHS   multivitamin with minerals  1 tablet Oral Daily   pilocarpine  1 drop Left Eye TID   polyethylene glycol  17 g Oral Daily   senna  1 tablet Oral BID   Continuous Infusions:  sodium chloride Stopped (03/19/23 1509)   methocarbamol (ROBAXIN) IV       LOS: 4 days    Time spent:    Azucena Fallen, DO [facility]  If 7PM-7AM, please contact night-coverage [email or web address]  03/20/2023, 7:10 AM

## 2023-03-20 NOTE — Progress Notes (Signed)
Mobility Specialist - Progress Note   03/20/23 1030  Mobility  Level of Assistance Moderate assist, patient does 50-74%  Assistive Device None  Distance Ambulated (ft) 0 ft  Range of Motion/Exercises Active;Left leg;Right leg  LLE Weight Bearing TWB  Activity Response Tolerated well  $Mobility charge 1 Mobility  Mobility Specialist Start Time (ACUTE ONLY) 0941  Mobility Specialist Stop Time (ACUTE ONLY) 1005  Mobility Specialist Time Calculation (min) (ACUTE ONLY) 24 min   Pt received in bed and c/o 8/10 pain in L hip. Pt hesitant to move L LE, but willing to try in bed exercises. Pt reported hip felt better at EOS. Left in bed with bed alarm on and all needs in reach.  In Bed Exercises:  Ankle Pumps x 20 Knee Presses x20 Heel Slides x 20 Hip Abduction x 20

## 2023-03-21 DIAGNOSIS — M8588 Other specified disorders of bone density and structure, other site: Secondary | ICD-10-CM | POA: Diagnosis not present

## 2023-03-21 DIAGNOSIS — Z7401 Bed confinement status: Secondary | ICD-10-CM | POA: Diagnosis not present

## 2023-03-21 DIAGNOSIS — R600 Localized edema: Secondary | ICD-10-CM | POA: Diagnosis not present

## 2023-03-21 DIAGNOSIS — I2699 Other pulmonary embolism without acute cor pulmonale: Secondary | ICD-10-CM | POA: Diagnosis not present

## 2023-03-21 DIAGNOSIS — J188 Other pneumonia, unspecified organism: Secondary | ICD-10-CM | POA: Diagnosis not present

## 2023-03-21 DIAGNOSIS — D6869 Other thrombophilia: Secondary | ICD-10-CM | POA: Diagnosis not present

## 2023-03-21 DIAGNOSIS — I82412 Acute embolism and thrombosis of left femoral vein: Secondary | ICD-10-CM | POA: Diagnosis not present

## 2023-03-21 DIAGNOSIS — R269 Unspecified abnormalities of gait and mobility: Secondary | ICD-10-CM | POA: Diagnosis not present

## 2023-03-21 DIAGNOSIS — F0390 Unspecified dementia without behavioral disturbance: Secondary | ICD-10-CM | POA: Diagnosis not present

## 2023-03-21 DIAGNOSIS — M25551 Pain in right hip: Secondary | ICD-10-CM | POA: Diagnosis not present

## 2023-03-21 DIAGNOSIS — S72002D Fracture of unspecified part of neck of left femur, subsequent encounter for closed fracture with routine healing: Secondary | ICD-10-CM | POA: Diagnosis not present

## 2023-03-21 DIAGNOSIS — Z741 Need for assistance with personal care: Secondary | ICD-10-CM | POA: Diagnosis not present

## 2023-03-21 DIAGNOSIS — F3341 Major depressive disorder, recurrent, in partial remission: Secondary | ICD-10-CM | POA: Diagnosis not present

## 2023-03-21 DIAGNOSIS — S7292XS Unspecified fracture of left femur, sequela: Secondary | ICD-10-CM | POA: Diagnosis not present

## 2023-03-21 DIAGNOSIS — L03116 Cellulitis of left lower limb: Secondary | ICD-10-CM | POA: Diagnosis not present

## 2023-03-21 DIAGNOSIS — E46 Unspecified protein-calorie malnutrition: Secondary | ICD-10-CM | POA: Diagnosis not present

## 2023-03-21 DIAGNOSIS — R296 Repeated falls: Secondary | ICD-10-CM | POA: Diagnosis not present

## 2023-03-21 DIAGNOSIS — I82409 Acute embolism and thrombosis of unspecified deep veins of unspecified lower extremity: Secondary | ICD-10-CM | POA: Diagnosis not present

## 2023-03-21 DIAGNOSIS — R293 Abnormal posture: Secondary | ICD-10-CM | POA: Diagnosis not present

## 2023-03-21 DIAGNOSIS — S79929A Unspecified injury of unspecified thigh, initial encounter: Secondary | ICD-10-CM | POA: Diagnosis not present

## 2023-03-21 DIAGNOSIS — I82432 Acute embolism and thrombosis of left popliteal vein: Secondary | ICD-10-CM | POA: Diagnosis not present

## 2023-03-21 DIAGNOSIS — D72829 Elevated white blood cell count, unspecified: Secondary | ICD-10-CM | POA: Diagnosis not present

## 2023-03-21 DIAGNOSIS — E876 Hypokalemia: Secondary | ICD-10-CM | POA: Diagnosis not present

## 2023-03-21 DIAGNOSIS — R4189 Other symptoms and signs involving cognitive functions and awareness: Secondary | ICD-10-CM | POA: Diagnosis not present

## 2023-03-21 DIAGNOSIS — M6281 Muscle weakness (generalized): Secondary | ICD-10-CM | POA: Diagnosis not present

## 2023-03-21 DIAGNOSIS — F03A0 Unspecified dementia, mild, without behavioral disturbance, psychotic disturbance, mood disturbance, and anxiety: Secondary | ICD-10-CM | POA: Diagnosis not present

## 2023-03-21 DIAGNOSIS — I700 Atherosclerosis of aorta: Secondary | ICD-10-CM | POA: Diagnosis not present

## 2023-03-21 DIAGNOSIS — J479 Bronchiectasis, uncomplicated: Secondary | ICD-10-CM | POA: Diagnosis not present

## 2023-03-21 DIAGNOSIS — M9702XD Periprosthetic fracture around internal prosthetic left hip joint, subsequent encounter: Secondary | ICD-10-CM | POA: Diagnosis not present

## 2023-03-21 DIAGNOSIS — I77810 Thoracic aortic ectasia: Secondary | ICD-10-CM | POA: Diagnosis not present

## 2023-03-21 DIAGNOSIS — W19XXXD Unspecified fall, subsequent encounter: Secondary | ICD-10-CM | POA: Diagnosis not present

## 2023-03-21 DIAGNOSIS — E441 Mild protein-calorie malnutrition: Secondary | ICD-10-CM | POA: Diagnosis not present

## 2023-03-21 DIAGNOSIS — I82402 Acute embolism and thrombosis of unspecified deep veins of left lower extremity: Secondary | ICD-10-CM | POA: Diagnosis not present

## 2023-03-21 DIAGNOSIS — D649 Anemia, unspecified: Secondary | ICD-10-CM | POA: Diagnosis not present

## 2023-03-21 DIAGNOSIS — R748 Abnormal levels of other serum enzymes: Secondary | ICD-10-CM | POA: Diagnosis not present

## 2023-03-21 DIAGNOSIS — I82401 Acute embolism and thrombosis of unspecified deep veins of right lower extremity: Secondary | ICD-10-CM | POA: Diagnosis not present

## 2023-03-21 DIAGNOSIS — M542 Cervicalgia: Secondary | ICD-10-CM | POA: Diagnosis not present

## 2023-03-21 DIAGNOSIS — K219 Gastro-esophageal reflux disease without esophagitis: Secondary | ICD-10-CM | POA: Diagnosis not present

## 2023-03-21 DIAGNOSIS — R2689 Other abnormalities of gait and mobility: Secondary | ICD-10-CM | POA: Diagnosis not present

## 2023-03-21 DIAGNOSIS — H401122 Primary open-angle glaucoma, left eye, moderate stage: Secondary | ICD-10-CM | POA: Diagnosis not present

## 2023-03-21 DIAGNOSIS — H409 Unspecified glaucoma: Secondary | ICD-10-CM | POA: Diagnosis not present

## 2023-03-21 DIAGNOSIS — S7292XD Unspecified fracture of left femur, subsequent encounter for closed fracture with routine healing: Secondary | ICD-10-CM | POA: Diagnosis not present

## 2023-03-21 DIAGNOSIS — I10 Essential (primary) hypertension: Secondary | ICD-10-CM | POA: Diagnosis not present

## 2023-03-21 DIAGNOSIS — R41841 Cognitive communication deficit: Secondary | ICD-10-CM | POA: Diagnosis not present

## 2023-03-21 DIAGNOSIS — H401113 Primary open-angle glaucoma, right eye, severe stage: Secondary | ICD-10-CM | POA: Diagnosis not present

## 2023-03-21 DIAGNOSIS — H4089 Other specified glaucoma: Secondary | ICD-10-CM | POA: Diagnosis not present

## 2023-03-21 DIAGNOSIS — E440 Moderate protein-calorie malnutrition: Secondary | ICD-10-CM | POA: Diagnosis not present

## 2023-03-21 DIAGNOSIS — Z0000 Encounter for general adult medical examination without abnormal findings: Secondary | ICD-10-CM | POA: Diagnosis not present

## 2023-03-21 DIAGNOSIS — R52 Pain, unspecified: Secondary | ICD-10-CM | POA: Diagnosis not present

## 2023-03-21 MED ORDER — ADULT MULTIVITAMIN W/MINERALS CH: 1.0000 | ORAL_TABLET | Freq: Every day | ORAL | 0 refills | Status: AC

## 2023-03-21 NOTE — Discharge Summary (Signed)
Physician Discharge Summary  RENYA KASLER ZOX:[id number] DOB: 03/08/1930 DOA: 03/15/2023  PCP: Soundra Pilon, FNP  Admit date: 03/15/2023 Discharge date: 03/21/2023  Admitted From: ALF Disposition:  Same Joetta Manners)  Recommendations for Outpatient Follow-up:  Follow up with PCP in 1-2 weeks  Discharge Condition:Stable  CODE STATUS:DNR  Diet recommendation:    Brief/Interim Summary: ADMIRE HUME is a 89 y.o. 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   Patient was evaluated by the orthopedic surgical planning for open reduction internal fixation.   Patient tolerated ORIF 03/18/2023, continues to work with PT and OT who recommend ongoing rehab at [facility].  Patient has tentatively been accepted on Monday, 03/22/2023.  She remained in the hospital in the meantime given no safe disposition otherwise and ongoing need for close monitoring and physical therapy that cannot be provided at home.   Patient remains medically stable for discharge. Now accepted to facility as of 03/21/23.  Discharge Diagnoses:  Principal Problem:   Femur fracture, left (HCC) Active Problems:   Essential hypertension   Dementia (HCC)   Leukocytosis   Anemia   Hypokalemia  Femur fracture, left (HCC)/ Oblique mildly displaced and angulated left femoral shaft fracture distal to the femoral stem of hip arthroplasty - Secondary to a mechanical fall.   -Ortho following, status post ORIF left femur fracture on 03/18/2023, tolerated well -Continue PT as tolerated, recommending SNF -DVT prophylaxis and pain control per Ortho -patient tolerating PT on low-dose hydrocodone, methocarbamol   Mild leukocytosis -Resolved, likely reactive   Mild transaminitis -Unclear etiology -Hepatitis panel pending -RUQ  ultrasound showed a mildly dilated CBD measuring 9 mm can be seen with aging and in the setting of prior cholecystectomy, mildly coarsened liver echotexture with mildly nodular contour's could reflect presence of fibrosis/developing cirrhosis. -Patient has no abdominal symptoms, no nausea vomiting or abdominal pain, no hyperbilirubinemia or elevated alk phos, obstructive pattern - d/w on-call GI, Dr. Levora Angel, patient was seen by [facility] in 2020 for the same/ mildly dilated CBD, has prior cholecystectomy, so no inpatient workup needed.  However patient needs to follow-up with her gastroenterologist regarding developing cirrhosis.    Acute on chronic anemia likely complicated by recent procedure -Baseline between 9 and 10  -Hemoglobin dropped minimally overnight currently 8.0, no signs or symptoms of bleeding, likely secondary to recent procedure as well as somewhat hemodilutional given recent IV fluids   Mild hypokalemia -Continue to follow   Hypertension: -BP stable   Dementia without behavioral component:  - Delirium precautions.  Currently at baseline.   Nodular bronchiectasis, subclinical NTM infection:  -Stable, followed by outpatient pulmonology.   Glaucoma -Continue outpatient ophthalmic drops   History of PE, provoked -Patient reports having completed Eliquis course 3 weeks ago   Mild protein calorie malnutrition Nutrition Problem: Inadequate oral intake Etiology: poor appetite Signs/Symptoms: meal completion < 25%, per patient/family report, mild muscle depletion, moderate muscle depletion, mild fat depletion Interventions: Ensure Enlive (each supplement provides 350kcal and 20 grams of protein), MVI, Refer to RD note for recommendations, Education Body mass index is 19.66 kg/m.   Discharge Instructions   Allergies as of 03/21/2023       Reactions   Other Other (See Comments)   Glaucoma drop with a dark green top - not sure of name - caused diarrhea - likely  pilocarpine        Medication List     STOP taking these medications    amLODipine 5 MG tablet Commonly known as: NORVASC   guaiFENesin 600 MG 12 hr tablet Commonly known as: Mucinex   hydrochlorothiazide 12.5 MG tablet Commonly known as: HYDRODIURIL   lidocaine 5 % Commonly known as: LIDODERM       TAKE these medications    acetaminophen 500 MG tablet Commonly known as: TYLENOL Take 500 mg by mouth every 6 (six) hours as needed for mild pain or moderate pain.   albuterol (2.5 MG/3ML) 0.083% nebulizer solution Commonly known as: PROVENTIL Take 3 mLs (2.5 mg total) by nebulization every 6 (six) hours as needed for wheezing or shortness of breath.   aspirin 81 MG chewable tablet Commonly known as: Aspirin Childrens Chew 1 tablet (81 mg total) by mouth 2 (two) times daily with a meal.   bismuth subsalicylate 262 MG/15ML suspension Commonly known as: Pepto-Bismol Take 30 mLs by mouth every 6 (six) hours as needed.   brimonidine 0.2 % ophthalmic solution Commonly known as: ALPHAGAN Place 1 drop into the left eye 3 (three) times daily.   carboxymethylcellulose 0.5 % Soln Commonly known as: REFRESH PLUS Place 1 drop into both eyes every 12 (twelve) hours as needed (eye discomfort).   citalopram 20 MG tablet Commonly known as: CELEXA Take 20 mg by mouth daily.   dorzolamide-timolol 2-0.5 % ophthalmic solution Commonly known as: COSOPT Place 1 drop into both eyes 2 (two) times daily.   HYDROcodone-acetaminophen 5-325 MG tablet Commonly known as: NORCO/VICODIN Take 1 tablet by mouth every 4 (four) hours as needed for up to 7 days for moderate pain or severe pain.   latanoprost 0.005 % ophthalmic solution Commonly known as: XALATAN Place 1 drop into the left eye at bedtime.   mirtazapine 7.5 MG tablet Commonly known as: REMERON Take 1 tablet (7.5 mg total) by mouth at bedtime.   multivitamin with minerals Tabs tablet Take 1 tablet by mouth daily. Start  taking on: March 22, 2023   pilocarpine 4 % ophthalmic solution Commonly known as: PILOCAR Place 1 drop into the left eye 3 (three) times daily.        Follow-up Information     Clois Dupes, [place]. Schedule an appointment as soon as possible for a visit in 7 day(s).   Specialty: Orthopedic Surgery Why: Follow-up within 7 days of discharge for removal of negative pressure dressing and wound re-check. Contact information: [address] [place] [address] [phone]                Allergies  Allergen Reactions   Other Other (See Comments)    Glaucoma drop with a dark green top - not sure of name - caused diarrhea - likely pilocarpine    Consultations: Ortho   Procedures/Studies: US Abdomen Limited RUQ (LIVER/GB)  Result Date: 03/19/2023 CLINICAL DATA:  [id number] Transaminitis [id number] EXAM: ULTRASOUND ABDOMEN LIMITED RIGHT UPPER QUADRANT COMPARISON:  None Available. FINDINGS: Gallbladder: Surgically absent. Common bile duct: Diameter: 9.0 mm. Liver: Mildly coarsened echotexture and mildly nodular contours. Portal vein is patent on color Doppler imaging with normal direction of blood flow towards the liver. Other: None. IMPRESSION: Mildly dilated common bile duct measuring 9.0 mm, can be seen with aging and in the setting of prior cholecystectomy. Correlate with LFTs for any signs of biliary obstruction. Mildly coarsened liver echotexture with mildly nodular contours, could reflect the presence of fibrosis/developing cirrhosis.  Consider follow-up hepatic ultrasound with elastography as clinically indicated. Electronically Signed   By: Caprice Renshaw M.D.   On: 03/19/2023 09:32   DG FEMUR PORT MIN 2 VIEWS LEFT  Result Date: 03/18/2023 CLINICAL DATA:  Status post surgery. EXAM: LEFT FEMUR PORTABLE 2 VIEWS COMPARISON:  None Available. FINDINGS: Status post chronic left hip arthroplasty. Status post ORIF with new plate and screw fixation with cerclage wires. Diffuse  osteopenia. Subcutaneous emphysema and multiple surgical clips about the lateral aspect of the leg. Surgical drain in place. IMPRESSION: Status post left femoral ORIF with new plate and screw fixation with cerclage wires. Electronically Signed   By: Larose Hires D.O.   On: 03/18/2023 20:02   DG FEMUR MIN 2 VIEWS LEFT  Result Date: 03/18/2023 CLINICAL DATA:  Known femoral fracture EXAM: LEFT FEMUR 2 VIEWS COMPARISON:  03/15/2023 FLUOROSCOPY TIME:  Radiation Exposure Index (as provided by the fluoroscopic device): 5.26 mGy If the device does not provide the exposure index: Fluoroscopy Time:  1 minute 45 seconds Number of Acquired Images:  34 FINDINGS: Initial images again demonstrate the previously seen mid to distal left femoral fracture. Fixation sideplate was then placed laterally along the distal femur. Fixation screws and cerclage wires were placed. Fracture fragments are in near anatomic alignment. IMPRESSION: ORIF of left femoral fracture Electronically Signed   By: Alcide Clever M.D.   On: 03/18/2023 19:59   DG C-Arm 1-60 Min-No Report  Result Date: 03/18/2023 Fluoroscopy was utilized by the requesting physician.  No radiographic interpretation.   DG C-Arm 1-60 Min-No Report  Result Date: 03/18/2023 Fluoroscopy was utilized by the requesting physician.  No radiographic interpretation.   DG C-Arm 1-60 Min-No Report  Result Date: 03/18/2023 Fluoroscopy was utilized by the requesting physician.  No radiographic interpretation.   CT FEMUR LEFT WO CONTRAST  Result Date: 03/16/2023 CLINICAL DATA:  Femur fracture EXAM: CT OF THE LOWER LEFT EXTREMITY WITHOUT CONTRAST TECHNIQUE: Multidetector CT imaging of the lower left extremity was performed according to the standard protocol. RADIATION DOSE REDUCTION: This exam was performed according to the departmental dose-optimization program which includes automated exposure control, adjustment of the mA and/or kV according to patient size and/or use of  iterative reconstruction technique. COMPARISON:  Radiographs 03/15/2023 FINDINGS: Bones/Joint/Cartilage Bony demineralization. Suspected old healed fracture of the right inferior pubic ramus, image 68 series 5. On images 69-70 of series 5, there is suspicion for a nondisplaced fracture of the left inferior pubic ramus posteriorly. There is also some mild cortical irregularity along the superior pubic ramus at the pubic body on image 6 of series 6 which is observed on radiographs from yesterday but not well seen on prior hip and pelvis radiographs, accordingly suspicious for a small nondisplaced fracture involving the left superior pubic ramus. A left hip hemiarthroplasty is in place. The patient had has a known oblique fracture of the femoral shaft demonstrating 3.5 cm overlap along with 2.0 cm medial displacement of the distal fracture fragment with respect to the proximal. There is also a linear nondisplaced fracture extending in the distal fragment anteriorly, which tapers as it approaches the intercondylar notch region, intra-articular extension not certain. Proximal margin of the femoral shaft fracture starts about 3 cm distal to the stem of the hemiarthroplasty and accordingly is not directly periprosthetic. No significant knee joint effusion. Osteoarthritis of the knee with compartmental narrowing implying chondral thinning especially in the medial compartment. Ligaments Suboptimally assessed by CT. Muscles and Tendons Expected edema along fascial planes adjacent to  the dominant displaced femoral shaft fracture. Soft tissues Sigmoid colon diverticulosis. IMPRESSION: 1. Oblique fracture of the femoral shaft with 3.5 cm overlap and 2.0 cm medial displacement of the distal fracture fragment with respect to the proximal. There is also a linear nondisplaced fracture extending in the distal fragment anteriorly, which tapers as it approaches the intercondylar notch region. The margin of this fracture is about 3 cm  from the distal hemiarthroplasty stem margin, accordingly no true periprosthetic component observed. 2. Suspected nondisplaced fracture of the left superior pubic ramus at the pubic body. Suspected nondisplaced fracture of the left inferior pubic ramus posteriorly. Both of these are subtle. 3. Bony demineralization. 4. Osteoarthritis of the knee. 5. Sigmoid colon diverticulosis. 6. Old healed fracture of the right inferior pubic ramus. Electronically Signed   By: Gaylyn Rong M.D.   On: 03/16/2023 15:45   CT Head Wo Contrast  Result Date: 03/15/2023 CLINICAL DATA:  Polytrauma, blunt EXAM: CT HEAD WITHOUT CONTRAST TECHNIQUE: Contiguous axial images were obtained from the base of the skull through the vertex without intravenous contrast. RADIATION DOSE REDUCTION: This exam was performed according to the departmental dose-optimization program which includes automated exposure control, adjustment of the mA and/or kV according to patient size and/or use of iterative reconstruction technique. COMPARISON:  CT head 11/02/2022 FINDINGS: Brain: Cerebral ventricle sizes are concordant with the degree of cerebral volume loss. Trace patchy and confluent areas of decreased attenuation are noted throughout the deep and periventricular white matter of the cerebral hemispheres bilaterally, compatible with chronic microvascular ischemic disease. No evidence of large-territorial acute infarction. No parenchymal hemorrhage. No mass lesion. No extra-axial collection. No mass effect or midline shift. No hydrocephalus. Basilar cisterns are patent. Vascular: No hyperdense vessel. Skull: No acute fracture or focal lesion. Sinuses/Orbits: Paranasal sinuses and mastoid air cells are clear. Bilateral lens replacement. Otherwise the orbits are unremarkable. Other: None. IMPRESSION: No acute intracranial abnormality. Electronically Signed   By: Tish Frederickson M.D.   On: 03/15/2023 21:38   DG Femur Min 2 Views Left  Result Date:  03/15/2023 CLINICAL DATA:  Left femur fracture after fall. EXAM: LEFT FEMUR 2 VIEWS COMPARISON:  None Available. FINDINGS: Oblique femoral shaft fracture. Fracture is distal to the femoral stem of hip arthroplasty. There is no convincing periprosthetic component. Osseous overriding of 4.7 cm with mild apex lateral angulation. The bones are under mineralized. Knee alignment is maintained. IMPRESSION: Oblique mildly displaced and angulated left femoral shaft fracture distal to the femoral stem of hip arthroplasty. Osseous overriding of 4.7 cm with mild apex lateral angulation. Electronically Signed   By: Narda Rutherford M.D.   On: 03/15/2023 21:30   DG Hip Unilat With Pelvis 2-3 Views Left  Result Date: 03/15/2023 CLINICAL DATA:  Hip fracture. Left femur fracture after fall. EXAM: DG HIP (WITH OR WITHOUT PELVIS) 2-3V LEFT COMPARISON:  None Available. FINDINGS: The femoral shaft fracture is appreciated on concurrent femur exam. Left hip arthroplasty is intact. The bony pelvis is intact, no pubic ramus fracture. Pubic symphysis and sacroiliac joints are congruent. IMPRESSION: 1. Left femoral shaft fracture is appreciated on concurrent femur exam. 2. Intact left hip arthroplasty.  No pelvic fracture. Electronically Signed   By: Narda Rutherford M.D.   On: 03/15/2023 21:28   VAS [place] LOWER EXTREMITY VENOUS (DVT)  Result Date: 03/05/2023  Lower Venous DVT Study Patient Name:  FALONDA MCCLUSKEY  Date of Exam:   03/05/2023 Medical Rec #: [id number]        Accession #:  [id number] Date of Birth: [birth year]         Patient Gender: F Patient Age:   16 years Exam Location:  [facility] Procedure:      VAS [place] LOWER EXTREMITY VENOUS (DVT) Referring Phys: Greig Castilla BRAKE --------------------------------------------------------------------------------  Indications: Swelling. Other Indications: Bilateral foot swelling. Right LE DVT in January 2024. Anticoagulation: Eliquis. Comparison Study: 10/24/22: Acute right DVT  throughout the leg. Performing Technologist: Thereasa Parkin RVT  Examination Guidelines: A complete evaluation includes B-mode imaging, spectral Doppler, color Doppler, and power Doppler as needed of all accessible portions of each vessel. Bilateral testing is considered an integral part of a complete examination. Limited examinations for reoccurring indications may be performed as noted. The reflux portion of the exam is performed with the patient in reverse Trendelenburg.  +---------+---------------+---------+-----------+----------+--------------+ RIGHT    CompressibilityPhasicitySpontaneityPropertiesThrombus Aging +---------+---------------+---------+-----------+----------+--------------+ CFV      Full           Yes      Yes                                 +---------+---------------+---------+-----------+----------+--------------+ SFJ      Full                    Yes                                 +---------+---------------+---------+-----------+----------+--------------+ FV Prox  Full           Yes      Yes                                 +---------+---------------+---------+-----------+----------+--------------+ FV Mid   Full           Yes      Yes                                 +---------+---------------+---------+-----------+----------+--------------+ FV DistalFull           Yes      Yes                                 +---------+---------------+---------+-----------+----------+--------------+ POP      Full           Yes      Yes                                 +---------+---------------+---------+-----------+----------+--------------+ PTV      Full                    Yes                                 +---------+---------------+---------+-----------+----------+--------------+ PERO     Full                    Yes                                 +---------+---------------+---------+-----------+----------+--------------+  GSV      Full            Yes      Yes                                 +---------+---------------+---------+-----------+----------+--------------+ +---------+---------------+---------+-----------+----------+--------------+ LEFT     CompressibilityPhasicitySpontaneityPropertiesThrombus Aging +---------+---------------+---------+-----------+----------+--------------+ CFV      Full           Yes      Yes                                 +---------+---------------+---------+-----------+----------+--------------+ SFJ      Full                    Yes                                 +---------+---------------+---------+-----------+----------+--------------+ FV Prox  Full           Yes      Yes                                 +---------+---------------+---------+-----------+----------+--------------+ FV Mid   Full           Yes      Yes                                 +---------+---------------+---------+-----------+----------+--------------+ FV DistalFull           Yes      Yes                                 +---------+---------------+---------+-----------+----------+--------------+ POP      Full           Yes      Yes                                 +---------+---------------+---------+-----------+----------+--------------+ PTV      Full                    Yes                                 +---------+---------------+---------+-----------+----------+--------------+ PERO     Full                    Yes                                 +---------+---------------+---------+-----------+----------+--------------+ GSV      Full           Yes      Yes                                 +---------+---------------+---------+-----------+----------+--------------+  Findings reported to [facility] at 1:36 pm.  Summary: RIGHT: - There is no evidence of deep vein thrombosis in the  lower extremity. - There is no evidence of superficial venous thrombosis.  - Previous DVT appears resolved.  LEFT: -  There is no evidence of deep vein thrombosis in the lower extremity. - There is no evidence of superficial venous thrombosis.  *See table(s) above for measurements and observations. Electronically signed by Gerarda Fraction on 03/05/2023 at 2:48:43 PM.    Final      Subjective: No acute issues/events overnight   Discharge Exam: Vitals:   03/21/23 0628 03/21/23 1334  BP: (!) 150/68 114/62  Pulse: 86 (!) 107  Resp: 17 17  Temp: 98.3 F (36.8 C) 99.4 F (37.4 C)  SpO2: 96% 94%   Vitals:   03/20/23 1346 03/20/23 2222 03/21/23 0628 03/21/23 1334  BP: (!) 128/58 139/72 (!) 150/68 114/62  Pulse: 83 93 86 (!) 107  Resp: 17 17 17 17   Temp: 98.1 F (36.7 C) 98.9 F (37.2 C) 98.3 F (36.8 C) 99.4 F (37.4 C)  TempSrc:  Oral Oral   SpO2: 98% 96% 96% 94%  Weight:      Height:        General: Pt is alert, awake, not in acute distress Cardiovascular: RRR, S1/S2 +, no rubs, no gallops Respiratory: CTA bilaterally, no wheezing, no rhonchi Abdominal: Soft, NT, ND, bowel sounds + Extremities: no edema, no cyanosis    The results of significant diagnostics from this hospitalization (including imaging, microbiology, ancillary and laboratory) are listed below for reference.     Microbiology: Recent Results (from the past 240 hour(s))  Surgical PCR screen     Status: None   Collection Time: 03/17/23 10:00 AM   Specimen: Nasal Mucosa; Nasal Swab  Result Value Ref Range Status   MRSA, PCR NEGATIVE NEGATIVE Final   Staphylococcus aureus NEGATIVE NEGATIVE Final    Comment: (NOTE) The Xpert SA Assay (FDA approved for NASAL specimens in patients 11 years of age and older), is one component of a comprehensive surveillance program. It is not intended to diagnose infection nor to guide or monitor treatment. Performed at [facility], [address], [place], [place] [address]      Labs: BNP (last 3 results) Recent Labs    10/24/22 0434  BNP 96.9   Basic Metabolic  Panel: Recent Labs  Lab 03/16/23 0326 03/17/23 0319 03/18/23 0321 03/18/23 1648 03/19/23 0331 03/20/23 0309  NA 134* 138 137 139 140 139  K 3.8 3.8 4.2 4.0 4.3 3.6  CL 102 108 106 104 109 108  CO2 22 25 25   --  26 24  GLUCOSE 153* 109* 98 102* 153* 110*  BUN 21 23 26* 17 17 26*  CREATININE 0.77 0.70 0.63 0.50 0.62 0.64  CALCIUM 8.6* 8.5* 8.3*  --  8.1* 8.1*   Liver Function Tests: Recent Labs  Lab 03/17/23 0319 03/18/23 0321 03/19/23 0331 03/20/23 0309  AST 200* 63* 96* 48*  ALT 71* 41 52* 33  ALKPHOS 82 74 76 60  BILITOT 1.0 0.8 1.1 0.7  PROT 6.5 6.2* 6.3* 5.6*  ALBUMIN 3.1* 2.8* 3.2* 2.7*   No results for input(s): "LIPASE", "AMYLASE" in the last 168 hours. No results for input(s): "AMMONIA" in the last 168 hours. CBC: Recent Labs  Lab 03/15/23 2101 03/16/23 0326 03/17/23 0319 03/18/23 0321 03/18/23 1648 03/18/23 2023 03/19/23 0331 03/20/23 0309  WBC 12.3*   < > 8.1 8.7  --  10.9* 8.4 9.9  NEUTROABS 9.8*  --  5.5  --   --   --   --   --  HGB 10.9*   < > 9.9* 9.3* 7.8* 10.1* 10.1* 8.0*  HCT 34.7*   < > 31.9* 29.9* 23.0* 32.8* 32.8* 25.7*  MCV 87.8   < > 89.6 90.1  --  89.1 89.6 89.9  PLT 269   < > 226 189  --  181 169 205   < > = values in this interval not displayed.   Urinalysis    Component Value Date/Time   COLORURINE YELLOW 10/24/2022 0440   APPEARANCEUR CLEAR 10/24/2022 0440   LABSPEC 1.015 10/24/2022 0440   PHURINE 7.0 10/24/2022 0440   GLUCOSEU NEGATIVE 10/24/2022 0440   HGBUR MODERATE (A) 10/24/2022 0440   BILIRUBINUR NEGATIVE 10/24/2022 0440   KETONESUR NEGATIVE 10/24/2022 0440   PROTEINUR NEGATIVE 10/24/2022 0440   UROBILINOGEN 0.2 09/28/2013 1439   NITRITE NEGATIVE 10/24/2022 0440   LEUKOCYTESUR TRACE (A) 10/24/2022 0440   Sepsis Labs Recent Labs  Lab 03/18/23 0321 03/18/23 2023 03/19/23 0331 03/20/23 0309  WBC 8.7 10.9* 8.4 9.9   Microbiology Recent Results (from the past 240 hour(s))  Surgical PCR screen     Status: None    Collection Time: 03/17/23 10:00 AM   Specimen: Nasal Mucosa; Nasal Swab  Result Value Ref Range Status   MRSA, PCR NEGATIVE NEGATIVE Final   Staphylococcus aureus NEGATIVE NEGATIVE Final    Comment: (NOTE) The Xpert SA Assay (FDA approved for NASAL specimens in patients 104 years of age and older), is one component of a comprehensive surveillance program. It is not intended to diagnose infection nor to guide or monitor treatment. Performed at [facility], [address], [place], [place] [address]      Time coordinating discharge: Over 30 minutes  SIGNED:   Azucena Fallen, DO [facility] 03/21/2023, 2:10 PM Pager   If 7PM-7AM, please contact night-coverage [email or web address]
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
## 2023-03-21 NOTE — TOC Transition Note (Addendum)
Transition of Care [facility]) - CM/SW Discharge Note   Patient Details  Name: Julia Hampton MRN: [id number] Date of Birth: 1930/03/16  Transition of Care [facility]) CM/SW Contact:  Georgie Chard, LCSW Phone Number: 03/21/2023, 2:25 PM   Clinical Narrative:    Addend@ 2:46 patient will DC back to facility Director has confirmed having conversation with patient's daughter. CSW has arranged PTAR for patient for 5:00 to ensure that patient has everything in order. TOC will follow until DC is complete.   CSW has spoke to the daughter about patient DC at this time. Daughter has expressed concerns about patient returning to same room at facility. This CSW has reached out to [place] from [facility] and reported these concerns. Toniann Fail has reached out to the director/Tony who than reached out to this CSW and reported that those accusations were incorrect. The director/Tony has reported to this CSW that he will reach out to the patient's daughter to discuss daughters concern. At this time this CSW is awaiting a final decision from the patients daughter per her conversation with facility. TOC will follow for DC/ transport arrangements. Final next level of care: Skilled Nursing Facility Barriers to Discharge: No Barriers Identified   Patient Goals and CMS Choice CMS Medicare.gov Compare Post Acute Care list provided to:: Patient Choice offered to / list presented to : Patient  Discharge Placement                  Patient to be transferred to facility by: PTAR Name of family member notified: Daughter LOIS Patient and family notified of of transfer: 03/21/23  Discharge Plan and Services Additional resources added to the After Visit Summary for   In-house Referral: Clinical Social Work   Post Acute Care Choice: Skilled Nursing Facility          DME Arranged: N/A DME Agency: NA                  Social Determinants of Health (SDOH) Interventions SDOH Screenings   Food Insecurity: No Food  Insecurity (03/15/2023)  Housing: Low Risk  (03/15/2023)  Transportation Needs: No Transportation Needs (03/15/2023)  Utilities: Not At Risk (03/15/2023)  Tobacco Use: Low Risk  (03/19/2023)     Readmission Risk Interventions    10/27/2022    3:07 PM  Readmission Risk Prevention Plan  Post Dischage Appt Complete  Medication Screening Complete  Transportation Screening Complete

## 2023-03-21 NOTE — Progress Notes (Deleted)
PROGRESS NOTE    Julia Hampton  WUJ:[id number] DOB: Dec 12, 1929 DOA: 03/15/2023 PCP: Soundra Pilon, FNP   Brief Narrative:  Julia Hampton is a 89 y.o. 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   Patient was evaluated by the orthopedic surgical planning for open reduction internal fixation.  Patient tolerated ORIF 03/18/2023, continues to work with PT and OT who recommend ongoing rehab at [facility].  Patient has tentatively been accepted on Monday, 03/22/2023.  She remained in the hospital in the meantime given no safe disposition otherwise and ongoing need for close monitoring and physical therapy that cannot be provided at home.  Patient remains medically stable for discharge. Awaiting safe disposition (appears to be accepted to SNF for 03/22/23) but remains in the hospital due to SNF staffing availability and they are unable to admit the patient over the weekend.  Assessment & Plan:   Principal Problem:   Femur fracture, left (HCC) Active Problems:   Essential hypertension   Dementia (HCC)   Leukocytosis   Anemia   Hypokalemia  Femur fracture, left (HCC)/ Oblique mildly displaced and angulated left femoral shaft fracture distal to the femoral stem of hip arthroplasty - Secondary to a mechanical fall.   -Ortho following, status post ORIF left femur fracture on 03/18/2023, tolerated well -Continue PT as tolerated, recommending SNF -DVT prophylaxis and pain control per Ortho -patient tolerating PT on low-dose hydrocodone, methocarbamol  Mild leukocytosis -Resolved, likely reactive   Mild transaminitis -Unclear etiology -Hepatitis panel pending -RUQ ultrasound showed a mildly dilated CBD measuring 9 mm can be seen with aging and in the setting of prior cholecystectomy,  mildly coarsened liver echotexture with mildly nodular contour's could reflect presence of fibrosis/developing cirrhosis. -Patient has no abdominal symptoms, no nausea vomiting or abdominal pain, no hyperbilirubinemia or elevated alk phos, obstructive pattern - d/w on-call GI, Dr. Levora Angel, patient was seen by [facility] in 2020 for the same/ mildly dilated CBD, has prior cholecystectomy, so no inpatient workup needed.  However patient needs to follow-up with her gastroenterologist regarding developing cirrhosis.    Acute on chronic anemia likely complicated by recent procedure -Baseline between 9 and 10  -Hemoglobin dropped minimally overnight currently 8.0, no signs or symptoms of bleeding, likely secondary to recent procedure as well as somewhat hemodilutional given recent IV fluids   Mild hypokalemia -Continue to follow   Hypertension: -BP stable   Dementia without behavioral component:  - Delirium precautions.  Currently at baseline.   Nodular bronchiectasis, subclinical NTM infection:  -Stable, followed by outpatient pulmonology.   Glaucoma -Continue outpatient ophthalmic drops   History of PE, provoked -Patient reports having completed Eliquis course 3 weeks ago   Mild protein calorie malnutrition Nutrition Problem: Inadequate oral intake Etiology: poor appetite Signs/Symptoms: meal completion < 25%, per patient/family report, mild muscle depletion, moderate muscle depletion, mild fat depletion Interventions: Ensure Enlive (each supplement provides 350kcal and 20 grams of protein), MVI, Refer to RD note for recommendations, Education   Estimated body mass index is 19.66 kg/m as calculated from the following:   Height as of this encounter: 5\' 1"  (1.549 m).   Weight as of this encounter: 47.2 kg.   DVT prophylaxis: enoxaparin (LOVENOX) injection 30 mg Start: 03/19/23 0800 SCDs Start: 03/18/23 2047 Code Status: DNR Family Communication: None present  Status is:  Inpatient  Dispo: The patient is from: Assisted living facility              Anticipated d/c is to: SNF              Anticipated d/c date is: 03/22/2023              Patient currently is medically stable for discharge, currently awaiting safe disposition in the form of nursing facility as outlined above  Consultants:  Orthopedic surgery  Procedures:  ORIF left femur 5/30  Antimicrobials:  Perioperatively  Subjective: No acute issues or events overnight denies nausea vomiting diarrhea constipation any fevers chills or chest pain  Objective: Vitals:   03/20/23 0611 03/20/23 1346 03/20/23 2222 03/21/23 0628  BP: 122/67 (!) 128/58 139/72 (!) 150/68  Pulse: 80 83 93 86  Resp: 18 17 17 17   Temp: 98.3 F (36.8 C) 98.1 F (36.7 C) 98.9 F (37.2 C) 98.3 F (36.8 C)  TempSrc: Oral  Oral Oral  SpO2: 100% 98% 96% 96%  Weight:      Height:        Intake/Output Summary (Last 24 hours) at 03/21/2023 0714 Last data filed at 03/21/2023 0600 Gross per 24 hour  Intake 120 ml  Output 200 ml  Net -80 ml    Filed Weights   03/15/23 2321  Weight: 47.2 kg    Examination:  General:  Pleasantly resting in bed, No acute distress. HEENT:  Normocephalic atraumatic.  Sclerae nonicteric, noninjected.  Extraocular movements intact bilaterally. Neck:  Without mass or deformity.  Trachea is midline. Lungs:  Clear to auscultate bilaterally without rhonchi, wheeze, or rales. Heart:  Regular rate and rhythm.  Without murmurs, rubs, or gallops. Abdomen:  Soft, nontender, nondistended.  Without guarding or rebound. Extremities: Without cyanosis, clubbing, edema, or obvious deformity. Vascular:  Dorsalis pedis and posterior tibial pulses palpable bilaterally. Skin:  Warm and dry, no erythema, no ulcerations.  Data Reviewed: I have personally reviewed following labs and imaging studies  CBC: Recent Labs  Lab 03/15/23 2101 03/16/23 0326 03/17/23 0319 03/18/23 0321 03/18/23 1648 03/18/23 2023  03/19/23 0331 03/20/23 0309  WBC 12.3*   < > 8.1 8.7  --  10.9* 8.4 9.9  NEUTROABS 9.8*  --  5.5  --   --   --   --   --   HGB 10.9*   < > 9.9* 9.3* 7.8* 10.1* 10.1* 8.0*  HCT 34.7*   < > 31.9* 29.9* 23.0* 32.8* 32.8* 25.7*  MCV 87.8   < > 89.6 90.1  --  89.1 89.6 89.9  PLT 269   < > 226 189  --  181 169 205   < > = values in this interval not displayed.    Basic Metabolic Panel: Recent Labs  Lab 03/16/23 0326 03/17/23 0319 03/18/23 0321 03/18/23 1648 03/19/23 0331 03/20/23 0309  NA 134* 138 137 139 140 139  K 3.8 3.8 4.2 4.0 4.3 3.6  CL 102 108 106 104 109 108  CO2 22 25 25   --  26 24  GLUCOSE 153* 109* 98 102* 153* 110*  BUN 21 23 26* 17 17 26*  CREATININE 0.77 0.70 0.63 0.50 0.62 0.64  CALCIUM 8.6* 8.5* 8.3*  --  8.1* 8.1*    GFR: Estimated Creatinine Clearance: 34.1 mL/min (by C-G formula based on SCr of 0.64 mg/dL). Liver Function Tests: Recent Labs  Lab 03/17/23 0319 03/18/23 0321 03/19/23 0331 03/20/23 0309  AST 200* 63* 96* 48*  ALT 71* 41 52* 33  ALKPHOS 82 74 76 60  BILITOT 1.0 0.8 1.1 0.7  PROT 6.5 6.2* 6.3* 5.6*  ALBUMIN 3.1* 2.8* 3.2* 2.7*    BNP (last 3 results) Recent Labs    12/11/22 1503  PROBNP 43.0     Recent Results (from the past 240 hour(s))  Surgical PCR screen     Status: None   Collection Time: 03/17/23 10:00 AM   Specimen: Nasal Mucosa; Nasal Swab  Result Value Ref Range Status   MRSA, PCR NEGATIVE NEGATIVE Final   Staphylococcus aureus NEGATIVE NEGATIVE Final    Comment: (NOTE) The Xpert SA Assay (FDA approved for NASAL specimens in patients 30 years of age and older), is one component of a comprehensive surveillance program. It is not intended to diagnose infection nor to guide or monitor treatment. Performed at [facility], [address], [place], [place] [address]          Radiology Studies: US Abdomen Limited RUQ (LIVER/GB)  Result Date: 03/19/2023 CLINICAL DATA:  [id number] Transaminitis  [id number] EXAM: ULTRASOUND ABDOMEN LIMITED RIGHT UPPER QUADRANT COMPARISON:  None Available. FINDINGS: Gallbladder: Surgically absent. Common bile duct: Diameter: 9.0 mm. Liver: Mildly coarsened echotexture and mildly nodular contours. Portal vein is patent on color Doppler imaging with normal direction of blood flow towards the liver. Other: None. IMPRESSION: Mildly dilated common bile duct measuring 9.0 mm, can be seen with aging and in the setting of prior cholecystectomy. Correlate with LFTs for any signs of biliary obstruction. Mildly coarsened liver echotexture with mildly nodular contours, could reflect the presence of fibrosis/developing cirrhosis. Consider follow-up hepatic ultrasound with elastography as clinically indicated. Electronically Signed   By: Caprice Renshaw M.D.   On: 03/19/2023 09:32        Scheduled Meds:  brimonidine  1 drop Left Eye TID   Chlorhexidine Gluconate Cloth  6 each Topical Daily   citalopram  20 mg Oral Daily   docusate sodium  100 mg Oral BID   dorzolamide-timolol  1 drop Both Eyes BID   enoxaparin (LOVENOX) injection  30 mg Subcutaneous Q24H   feeding supplement  237 mL Oral BID BM   latanoprost  1 drop Left Eye QHS   mirtazapine  7.5 mg Oral QHS   multivitamin with minerals  1 tablet Oral Daily   pilocarpine  1 drop Left Eye TID   polyethylene glycol  17 g Oral Daily   senna  1 tablet Oral BID   Continuous Infusions:  sodium chloride Stopped (03/19/23 1509)   methocarbamol (ROBAXIN) IV       LOS: 5 days    Time spent:    Azucena Fallen, DO [facility]  If 7PM-7AM, please contact night-coverage [email or web address]  03/21/2023, 7:14 AM

## 2023-03-22 DIAGNOSIS — I10 Essential (primary) hypertension: Secondary | ICD-10-CM | POA: Diagnosis not present

## 2023-03-22 DIAGNOSIS — F0390 Unspecified dementia without behavioral disturbance: Secondary | ICD-10-CM | POA: Diagnosis not present

## 2023-03-22 DIAGNOSIS — E441 Mild protein-calorie malnutrition: Secondary | ICD-10-CM | POA: Diagnosis not present

## 2023-03-22 DIAGNOSIS — S72002D Fracture of unspecified part of neck of left femur, subsequent encounter for closed fracture with routine healing: Secondary | ICD-10-CM | POA: Diagnosis not present

## 2023-03-22 DIAGNOSIS — R600 Localized edema: Secondary | ICD-10-CM | POA: Diagnosis not present

## 2023-03-22 DIAGNOSIS — R748 Abnormal levels of other serum enzymes: Secondary | ICD-10-CM | POA: Diagnosis not present

## 2023-03-22 DIAGNOSIS — R4189 Other symptoms and signs involving cognitive functions and awareness: Secondary | ICD-10-CM | POA: Diagnosis not present

## 2023-03-22 DIAGNOSIS — M542 Cervicalgia: Secondary | ICD-10-CM | POA: Diagnosis not present

## 2023-03-22 DIAGNOSIS — H409 Unspecified glaucoma: Secondary | ICD-10-CM | POA: Diagnosis not present

## 2023-03-22 LAB — HEPATITIS PANEL, ACUTE

## 2023-03-23 DIAGNOSIS — F3341 Major depressive disorder, recurrent, in partial remission: Secondary | ICD-10-CM | POA: Diagnosis not present

## 2023-03-23 DIAGNOSIS — R296 Repeated falls: Secondary | ICD-10-CM | POA: Diagnosis not present

## 2023-03-23 DIAGNOSIS — I10 Essential (primary) hypertension: Secondary | ICD-10-CM | POA: Diagnosis not present

## 2023-03-23 DIAGNOSIS — F0390 Unspecified dementia without behavioral disturbance: Secondary | ICD-10-CM | POA: Diagnosis not present

## 2023-03-23 DIAGNOSIS — E440 Moderate protein-calorie malnutrition: Secondary | ICD-10-CM | POA: Diagnosis not present

## 2023-03-23 DIAGNOSIS — K219 Gastro-esophageal reflux disease without esophagitis: Secondary | ICD-10-CM | POA: Diagnosis not present

## 2023-03-25 DIAGNOSIS — E441 Mild protein-calorie malnutrition: Secondary | ICD-10-CM | POA: Diagnosis not present

## 2023-03-25 DIAGNOSIS — I10 Essential (primary) hypertension: Secondary | ICD-10-CM | POA: Diagnosis not present

## 2023-03-25 DIAGNOSIS — S72002D Fracture of unspecified part of neck of left femur, subsequent encounter for closed fracture with routine healing: Secondary | ICD-10-CM | POA: Diagnosis not present

## 2023-03-25 DIAGNOSIS — F0390 Unspecified dementia without behavioral disturbance: Secondary | ICD-10-CM | POA: Diagnosis not present

## 2023-03-26 DIAGNOSIS — F0390 Unspecified dementia without behavioral disturbance: Secondary | ICD-10-CM | POA: Diagnosis not present

## 2023-03-26 DIAGNOSIS — R269 Unspecified abnormalities of gait and mobility: Secondary | ICD-10-CM | POA: Diagnosis not present

## 2023-03-26 DIAGNOSIS — I2699 Other pulmonary embolism without acute cor pulmonale: Secondary | ICD-10-CM | POA: Diagnosis not present

## 2023-03-26 DIAGNOSIS — E46 Unspecified protein-calorie malnutrition: Secondary | ICD-10-CM | POA: Diagnosis not present

## 2023-04-02 DIAGNOSIS — I2699 Other pulmonary embolism without acute cor pulmonale: Secondary | ICD-10-CM | POA: Diagnosis not present

## 2023-04-02 DIAGNOSIS — E46 Unspecified protein-calorie malnutrition: Secondary | ICD-10-CM | POA: Diagnosis not present

## 2023-04-02 DIAGNOSIS — F0390 Unspecified dementia without behavioral disturbance: Secondary | ICD-10-CM | POA: Diagnosis not present

## 2023-04-02 DIAGNOSIS — R269 Unspecified abnormalities of gait and mobility: Secondary | ICD-10-CM | POA: Diagnosis not present

## 2023-04-05 DIAGNOSIS — M9702XD Periprosthetic fracture around internal prosthetic left hip joint, subsequent encounter: Secondary | ICD-10-CM | POA: Diagnosis not present

## 2023-04-06 DIAGNOSIS — F0390 Unspecified dementia without behavioral disturbance: Secondary | ICD-10-CM | POA: Diagnosis not present

## 2023-04-06 DIAGNOSIS — S72002D Fracture of unspecified part of neck of left femur, subsequent encounter for closed fracture with routine healing: Secondary | ICD-10-CM | POA: Diagnosis not present

## 2023-04-06 DIAGNOSIS — I82401 Acute embolism and thrombosis of unspecified deep veins of right lower extremity: Secondary | ICD-10-CM | POA: Diagnosis not present

## 2023-04-06 DIAGNOSIS — I10 Essential (primary) hypertension: Secondary | ICD-10-CM | POA: Diagnosis not present

## 2023-04-20 DIAGNOSIS — I82401 Acute embolism and thrombosis of unspecified deep veins of right lower extremity: Secondary | ICD-10-CM | POA: Diagnosis not present

## 2023-04-20 DIAGNOSIS — R4189 Other symptoms and signs involving cognitive functions and awareness: Secondary | ICD-10-CM | POA: Diagnosis not present

## 2023-04-20 DIAGNOSIS — F0390 Unspecified dementia without behavioral disturbance: Secondary | ICD-10-CM | POA: Diagnosis not present

## 2023-04-20 DIAGNOSIS — R600 Localized edema: Secondary | ICD-10-CM | POA: Diagnosis not present

## 2023-04-24 DIAGNOSIS — F0390 Unspecified dementia without behavioral disturbance: Secondary | ICD-10-CM | POA: Diagnosis not present

## 2023-04-24 DIAGNOSIS — I2699 Other pulmonary embolism without acute cor pulmonale: Secondary | ICD-10-CM | POA: Diagnosis not present

## 2023-04-24 DIAGNOSIS — H409 Unspecified glaucoma: Secondary | ICD-10-CM | POA: Diagnosis not present

## 2023-04-24 DIAGNOSIS — R4189 Other symptoms and signs involving cognitive functions and awareness: Secondary | ICD-10-CM | POA: Diagnosis not present

## 2023-04-24 DIAGNOSIS — J479 Bronchiectasis, uncomplicated: Secondary | ICD-10-CM | POA: Diagnosis not present

## 2023-04-24 DIAGNOSIS — R600 Localized edema: Secondary | ICD-10-CM | POA: Diagnosis not present

## 2023-04-26 DIAGNOSIS — R600 Localized edema: Secondary | ICD-10-CM | POA: Diagnosis not present

## 2023-04-26 DIAGNOSIS — I10 Essential (primary) hypertension: Secondary | ICD-10-CM | POA: Diagnosis not present

## 2023-04-26 DIAGNOSIS — L03116 Cellulitis of left lower limb: Secondary | ICD-10-CM | POA: Diagnosis not present

## 2023-04-26 DIAGNOSIS — F0390 Unspecified dementia without behavioral disturbance: Secondary | ICD-10-CM | POA: Diagnosis not present

## 2023-04-28 DIAGNOSIS — I10 Essential (primary) hypertension: Secondary | ICD-10-CM | POA: Diagnosis not present

## 2023-04-28 DIAGNOSIS — L03116 Cellulitis of left lower limb: Secondary | ICD-10-CM | POA: Diagnosis not present

## 2023-04-28 DIAGNOSIS — F0390 Unspecified dementia without behavioral disturbance: Secondary | ICD-10-CM | POA: Diagnosis not present

## 2023-04-28 DIAGNOSIS — I82402 Acute embolism and thrombosis of unspecified deep veins of left lower extremity: Secondary | ICD-10-CM | POA: Diagnosis not present

## 2023-05-03 DIAGNOSIS — H401122 Primary open-angle glaucoma, left eye, moderate stage: Secondary | ICD-10-CM | POA: Diagnosis not present

## 2023-05-03 DIAGNOSIS — H401113 Primary open-angle glaucoma, right eye, severe stage: Secondary | ICD-10-CM | POA: Diagnosis not present

## 2023-05-04 DIAGNOSIS — M9702XD Periprosthetic fracture around internal prosthetic left hip joint, subsequent encounter: Secondary | ICD-10-CM | POA: Diagnosis not present

## 2023-05-13 DIAGNOSIS — E440 Moderate protein-calorie malnutrition: Secondary | ICD-10-CM | POA: Diagnosis not present

## 2023-05-13 DIAGNOSIS — I82402 Acute embolism and thrombosis of unspecified deep veins of left lower extremity: Secondary | ICD-10-CM | POA: Diagnosis not present

## 2023-05-13 DIAGNOSIS — F3341 Major depressive disorder, recurrent, in partial remission: Secondary | ICD-10-CM | POA: Diagnosis not present

## 2023-05-13 DIAGNOSIS — F0390 Unspecified dementia without behavioral disturbance: Secondary | ICD-10-CM | POA: Diagnosis not present

## 2023-05-13 DIAGNOSIS — K219 Gastro-esophageal reflux disease without esophagitis: Secondary | ICD-10-CM | POA: Diagnosis not present

## 2023-05-14 DIAGNOSIS — R269 Unspecified abnormalities of gait and mobility: Secondary | ICD-10-CM | POA: Diagnosis not present

## 2023-05-14 DIAGNOSIS — F0390 Unspecified dementia without behavioral disturbance: Secondary | ICD-10-CM | POA: Diagnosis not present

## 2023-05-14 DIAGNOSIS — I2699 Other pulmonary embolism without acute cor pulmonale: Secondary | ICD-10-CM | POA: Diagnosis not present

## 2023-05-14 DIAGNOSIS — E46 Unspecified protein-calorie malnutrition: Secondary | ICD-10-CM | POA: Diagnosis not present

## 2023-05-20 DIAGNOSIS — K219 Gastro-esophageal reflux disease without esophagitis: Secondary | ICD-10-CM | POA: Diagnosis not present

## 2023-05-20 DIAGNOSIS — I10 Essential (primary) hypertension: Secondary | ICD-10-CM | POA: Diagnosis not present

## 2023-05-20 DIAGNOSIS — S72002D Fracture of unspecified part of neck of left femur, subsequent encounter for closed fracture with routine healing: Secondary | ICD-10-CM | POA: Diagnosis not present

## 2023-05-20 DIAGNOSIS — F0390 Unspecified dementia without behavioral disturbance: Secondary | ICD-10-CM | POA: Diagnosis not present

## 2023-05-27 DIAGNOSIS — I10 Essential (primary) hypertension: Secondary | ICD-10-CM | POA: Diagnosis not present

## 2023-05-27 DIAGNOSIS — K219 Gastro-esophageal reflux disease without esophagitis: Secondary | ICD-10-CM | POA: Diagnosis not present

## 2023-05-27 DIAGNOSIS — E440 Moderate protein-calorie malnutrition: Secondary | ICD-10-CM | POA: Diagnosis not present

## 2023-05-27 DIAGNOSIS — F0390 Unspecified dementia without behavioral disturbance: Secondary | ICD-10-CM | POA: Diagnosis not present

## 2023-05-31 DIAGNOSIS — I10 Essential (primary) hypertension: Secondary | ICD-10-CM | POA: Diagnosis not present

## 2023-05-31 DIAGNOSIS — I82402 Acute embolism and thrombosis of unspecified deep veins of left lower extremity: Secondary | ICD-10-CM | POA: Diagnosis not present

## 2023-05-31 DIAGNOSIS — M25551 Pain in right hip: Secondary | ICD-10-CM | POA: Diagnosis not present

## 2023-05-31 DIAGNOSIS — F0390 Unspecified dementia without behavioral disturbance: Secondary | ICD-10-CM | POA: Diagnosis not present

## 2023-05-31 DIAGNOSIS — W19XXXD Unspecified fall, subsequent encounter: Secondary | ICD-10-CM | POA: Diagnosis not present

## 2023-06-02 DIAGNOSIS — I700 Atherosclerosis of aorta: Secondary | ICD-10-CM | POA: Diagnosis not present

## 2023-06-02 DIAGNOSIS — I77810 Thoracic aortic ectasia: Secondary | ICD-10-CM | POA: Diagnosis not present

## 2023-06-02 DIAGNOSIS — F3341 Major depressive disorder, recurrent, in partial remission: Secondary | ICD-10-CM | POA: Diagnosis not present

## 2023-06-02 DIAGNOSIS — K219 Gastro-esophageal reflux disease without esophagitis: Secondary | ICD-10-CM | POA: Diagnosis not present

## 2023-06-02 DIAGNOSIS — Z0000 Encounter for general adult medical examination without abnormal findings: Secondary | ICD-10-CM | POA: Diagnosis not present

## 2023-06-02 DIAGNOSIS — F0390 Unspecified dementia without behavioral disturbance: Secondary | ICD-10-CM | POA: Diagnosis not present

## 2023-06-02 DIAGNOSIS — D6869 Other thrombophilia: Secondary | ICD-10-CM | POA: Diagnosis not present

## 2023-06-02 DIAGNOSIS — E440 Moderate protein-calorie malnutrition: Secondary | ICD-10-CM | POA: Diagnosis not present

## 2023-06-02 DIAGNOSIS — M8588 Other specified disorders of bone density and structure, other site: Secondary | ICD-10-CM | POA: Diagnosis not present

## 2023-06-02 DIAGNOSIS — I10 Essential (primary) hypertension: Secondary | ICD-10-CM | POA: Diagnosis not present

## 2023-06-03 DIAGNOSIS — D649 Anemia, unspecified: Secondary | ICD-10-CM | POA: Diagnosis not present

## 2023-06-03 DIAGNOSIS — E559 Vitamin D deficiency, unspecified: Secondary | ICD-10-CM | POA: Diagnosis not present

## 2023-06-03 DIAGNOSIS — I10 Essential (primary) hypertension: Secondary | ICD-10-CM | POA: Diagnosis not present

## 2023-06-04 DIAGNOSIS — M6281 Muscle weakness (generalized): Secondary | ICD-10-CM | POA: Diagnosis not present

## 2023-06-04 DIAGNOSIS — S7292XD Unspecified fracture of left femur, subsequent encounter for closed fracture with routine healing: Secondary | ICD-10-CM | POA: Diagnosis not present

## 2023-06-04 DIAGNOSIS — R278 Other lack of coordination: Secondary | ICD-10-CM | POA: Diagnosis not present

## 2023-06-07 DIAGNOSIS — S7292XD Unspecified fracture of left femur, subsequent encounter for closed fracture with routine healing: Secondary | ICD-10-CM | POA: Diagnosis not present

## 2023-06-07 DIAGNOSIS — R278 Other lack of coordination: Secondary | ICD-10-CM | POA: Diagnosis not present

## 2023-06-07 DIAGNOSIS — M6281 Muscle weakness (generalized): Secondary | ICD-10-CM | POA: Diagnosis not present

## 2023-06-08 DIAGNOSIS — M6281 Muscle weakness (generalized): Secondary | ICD-10-CM | POA: Diagnosis not present

## 2023-06-08 DIAGNOSIS — S7292XD Unspecified fracture of left femur, subsequent encounter for closed fracture with routine healing: Secondary | ICD-10-CM | POA: Diagnosis not present

## 2023-06-08 DIAGNOSIS — R278 Other lack of coordination: Secondary | ICD-10-CM | POA: Diagnosis not present

## 2023-06-09 DIAGNOSIS — S7292XD Unspecified fracture of left femur, subsequent encounter for closed fracture with routine healing: Secondary | ICD-10-CM | POA: Diagnosis not present

## 2023-06-09 DIAGNOSIS — R278 Other lack of coordination: Secondary | ICD-10-CM | POA: Diagnosis not present

## 2023-06-09 DIAGNOSIS — M6281 Muscle weakness (generalized): Secondary | ICD-10-CM | POA: Diagnosis not present

## 2023-06-11 DIAGNOSIS — E46 Unspecified protein-calorie malnutrition: Secondary | ICD-10-CM | POA: Diagnosis not present

## 2023-06-11 DIAGNOSIS — M6281 Muscle weakness (generalized): Secondary | ICD-10-CM | POA: Diagnosis not present

## 2023-06-11 DIAGNOSIS — R269 Unspecified abnormalities of gait and mobility: Secondary | ICD-10-CM | POA: Diagnosis not present

## 2023-06-11 DIAGNOSIS — S7292XD Unspecified fracture of left femur, subsequent encounter for closed fracture with routine healing: Secondary | ICD-10-CM | POA: Diagnosis not present

## 2023-06-11 DIAGNOSIS — I2699 Other pulmonary embolism without acute cor pulmonale: Secondary | ICD-10-CM | POA: Diagnosis not present

## 2023-06-11 DIAGNOSIS — F0390 Unspecified dementia without behavioral disturbance: Secondary | ICD-10-CM | POA: Diagnosis not present

## 2023-06-11 DIAGNOSIS — R278 Other lack of coordination: Secondary | ICD-10-CM | POA: Diagnosis not present

## 2023-06-14 DIAGNOSIS — S7292XD Unspecified fracture of left femur, subsequent encounter for closed fracture with routine healing: Secondary | ICD-10-CM | POA: Diagnosis not present

## 2023-06-14 DIAGNOSIS — K219 Gastro-esophageal reflux disease without esophagitis: Secondary | ICD-10-CM | POA: Diagnosis not present

## 2023-06-14 DIAGNOSIS — F0390 Unspecified dementia without behavioral disturbance: Secondary | ICD-10-CM | POA: Diagnosis not present

## 2023-06-14 DIAGNOSIS — R278 Other lack of coordination: Secondary | ICD-10-CM | POA: Diagnosis not present

## 2023-06-14 DIAGNOSIS — E440 Moderate protein-calorie malnutrition: Secondary | ICD-10-CM | POA: Diagnosis not present

## 2023-06-14 DIAGNOSIS — I10 Essential (primary) hypertension: Secondary | ICD-10-CM | POA: Diagnosis not present

## 2023-06-14 DIAGNOSIS — M6281 Muscle weakness (generalized): Secondary | ICD-10-CM | POA: Diagnosis not present

## 2023-06-15 ENCOUNTER — Other Ambulatory Visit: Payer: Self-pay

## 2023-06-15 ENCOUNTER — Emergency Department (HOSPITAL_COMMUNITY): Payer: Medicare PPO

## 2023-06-15 ENCOUNTER — Emergency Department (HOSPITAL_COMMUNITY)
Admission: EM | Admit: 2023-06-15 | Discharge: 2023-06-15 | Disposition: A | Discharge status: Home / Self Care | Payer: Medicare PPO | Attending: Emergency Medicine | Admitting: Emergency Medicine

## 2023-06-15 DIAGNOSIS — Z7901 Long term (current) use of anticoagulants: Secondary | ICD-10-CM | POA: Diagnosis not present

## 2023-06-15 DIAGNOSIS — Z7982 Long term (current) use of aspirin: Secondary | ICD-10-CM | POA: Insufficient documentation

## 2023-06-15 DIAGNOSIS — R531 Weakness: Secondary | ICD-10-CM | POA: Diagnosis not present

## 2023-06-15 DIAGNOSIS — W19XXXA Unspecified fall, initial encounter: Secondary | ICD-10-CM | POA: Diagnosis not present

## 2023-06-15 DIAGNOSIS — S0093XA Contusion of unspecified part of head, initial encounter: Secondary | ICD-10-CM | POA: Insufficient documentation

## 2023-06-15 DIAGNOSIS — S0990XA Unspecified injury of head, initial encounter: Secondary | ICD-10-CM

## 2023-06-15 DIAGNOSIS — I10 Essential (primary) hypertension: Secondary | ICD-10-CM | POA: Diagnosis not present

## 2023-06-15 DIAGNOSIS — G4489 Other headache syndrome: Secondary | ICD-10-CM | POA: Diagnosis not present

## 2023-06-15 DIAGNOSIS — Z743 Need for continuous supervision: Secondary | ICD-10-CM | POA: Diagnosis not present

## 2023-06-15 DIAGNOSIS — W01198A Fall on same level from slipping, tripping and stumbling with subsequent striking against other object, initial encounter: Secondary | ICD-10-CM | POA: Diagnosis not present

## 2023-06-15 DIAGNOSIS — S0003XA Contusion of scalp, initial encounter: Secondary | ICD-10-CM | POA: Diagnosis not present

## 2023-06-15 DIAGNOSIS — R58 Hemorrhage, not elsewhere classified: Secondary | ICD-10-CM | POA: Diagnosis not present

## 2023-06-15 DIAGNOSIS — S199XXA Unspecified injury of neck, initial encounter: Secondary | ICD-10-CM | POA: Diagnosis not present

## 2023-06-15 DIAGNOSIS — I6529 Occlusion and stenosis of unspecified carotid artery: Secondary | ICD-10-CM | POA: Diagnosis not present

## 2023-06-15 DIAGNOSIS — F0390 Unspecified dementia without behavioral disturbance: Secondary | ICD-10-CM | POA: Insufficient documentation

## 2023-06-15 DIAGNOSIS — S0993XA Unspecified injury of face, initial encounter: Secondary | ICD-10-CM | POA: Diagnosis not present

## 2023-06-15 DIAGNOSIS — N390 Urinary tract infection, site not specified: Secondary | ICD-10-CM | POA: Diagnosis not present

## 2023-06-15 DIAGNOSIS — R9431 Abnormal electrocardiogram [ECG] [EKG]: Secondary | ICD-10-CM | POA: Diagnosis not present

## 2023-06-15 MED ORDER — ACETAMINOPHEN 325 MG PO TABS
650.0000 mg | ORAL_TABLET | Freq: Once | ORAL | Status: AC
  Administered 2023-06-15: 650 mg via ORAL
  Filled 2023-06-15: qty 2

## 2023-06-15 NOTE — ED Provider Notes (Signed)
EMERGENCY DEPARTMENT AT [facility] Provider Note   CSN: [id number] Arrival date & time: 06/15/23  4132     History {Add pertinent medical, surgical, social history, OB history to HPI:1} Chief Complaint  Patient presents with   Fall   Head Injury   Headache    Julia Hampton is a 89 y.o. female.  Patient fell and hit her head.  Questionable loss of consciousness.  Patient is taking Eliquis.  She has a history of previous ear surgery   Fall Associated symptoms include headaches.  Head Injury Associated symptoms: headache   Headache      Home Medications Prior to Admission medications   Medication Sig Start Date End Date Taking? Authorizing Provider  acetaminophen (TYLENOL) 325 MG tablet Take 650 mg by mouth every 6 (six) hours as needed for mild pain or moderate pain.   Yes [provider]  albuterol (PROVENTIL) (2.5 MG/3ML) 0.083% nebulizer solution Take 3 mLs (2.5 mg total) by nebulization every 6 (six) hours as needed for wheezing or shortness of breath. 01/19/23  Yes Omar Person, MD  apixaban (ELIQUIS) 2.5 MG TABS tablet Take 2.5 mg by mouth 2 (two) times daily.   Yes [provider]  aspirin EC 81 MG tablet Take 81 mg by mouth 2 (two) times daily. Swallow whole.   Yes [provider]  bismuth subsalicylate (PEPTO-BISMOL) 262 MG/15ML suspension Take 30 mLs by mouth every 6 (six) hours as needed. 01/22/17  Yes Lyndal Pulley, MD  brimonidine (ALPHAGAN) 0.2 % ophthalmic solution Place 1 drop into the left eye 3 (three) times daily.   Yes [provider]  calcium-vitamin D (OSCAL WITH D) 500-5 MG-MCG tablet Take 1 tablet by mouth daily.   Yes [provider]  carboxymethylcellulose (REFRESH PLUS) 0.5 % SOLN Place 1 drop into both eyes 2 (two) times daily.   Yes [provider]  citalopram (CELEXA) 20 MG tablet Take 20 mg by mouth daily. 10/23/22  Yes [provider]  dorzolamide-timolol  (COSOPT) 2-0.5 % ophthalmic solution Place 1 drop into both eyes 2 (two) times daily.   Yes [provider]  latanoprost (XALATAN) 0.005 % ophthalmic solution Place 1 drop into the left eye at bedtime. 09/19/22  Yes [provider]  mirtazapine (REMERON) 7.5 MG tablet Take 1 tablet (7.5 mg total) by mouth at bedtime. 10/30/22  Yes Danford, Earl Lites, MD  Multiple Vitamin (MULTIVITAMIN WITH MINERALS) TABS tablet Take 1 tablet by mouth daily. 03/22/23  Yes Azucena Fallen, MD  NUTRITIONAL SUPPLEMENTS PO Take 120 mLs by mouth 2 (two) times daily.   Yes [provider]  pilocarpine (PILOCAR) 4 % ophthalmic solution Place 1 drop into the left eye 3 (three) times daily.   Yes [provider]      Allergies    Other    Review of Systems   Review of Systems  Neurological:  Positive for headaches.    Physical Exam Updated Vital Signs BP (!) 172/72 (BP Location: Right Arm)   Pulse 67   Temp 98.4 F (36.9 C) (Oral)   Resp 17   Ht 5\' 1"  (1.549 m)   Wt 47 kg   SpO2 95%   BMI 19.58 kg/m  Physical Exam  ED Results / Procedures / Treatments   Labs (all labs ordered are listed, but only abnormal results are displayed) Labs Reviewed - No data to display  EKG None  Radiology CT Head Wo Contrast  Result  Date: 06/15/2023 CLINICAL DATA:  Facial trauma, blunt. Mechanical fall with head strike. EXAM: CT HEAD WITHOUT CONTRAST CT CERVICAL SPINE WITHOUT CONTRAST TECHNIQUE: Multidetector CT imaging of the head and cervical spine was performed following the standard protocol without intravenous contrast. Multiplanar CT image reconstructions of the cervical spine were also generated. RADIATION DOSE REDUCTION: This exam was performed according to the departmental dose-optimization program which includes automated exposure control, adjustment of the mA and/or kV according to patient size and/or use of iterative reconstruction technique. COMPARISON:  Head CT  03/15/2023. FINDINGS: CT HEAD FINDINGS Brain: No acute hemorrhage. Unchanged chronic small-vessel disease. Cortical gray-white differentiation is otherwise preserved. Prominence of the ventricles and sulci within expected range for age. No hydrocephalus or extra-axial collection. No mass effect or midline shift. Vascular: No hyperdense vessel or unexpected calcification. Skull: No calvarial fracture or suspicious bone lesion. Skull base is unremarkable. Sinuses/Orbits: No acute finding. Soft tissue in the right middle ear cavity and external auditory canal with associated bony erosions, suspicious for acquired cholesteatoma. Other: Right parietal scalp hematoma. CT CERVICAL SPINE FINDINGS Alignment: Normal. Skull base and vertebrae: No acute fracture. Normal craniocervical junction. No suspicious bone lesions. Soft tissues and spinal canal: No prevertebral fluid or swelling. No visible canal hematoma. Disc levels: Mild cervical spondylosis without high-grade spinal canal stenosis. Upper chest: No acute findings. Biapical pleural-parenchymal scarring. Other: Atherosclerotic calcifications of the carotid bulbs. IMPRESSION: 1. No acute intracranial abnormality. Right parietal scalp hematoma. 2. No acute cervical spine fracture or traumatic listhesis. 3. Soft tissue in the right middle ear cavity and external auditory canal with associated bony erosions, suspicious for acquired cholesteatoma. ENT consultation is recommended. Electronically Signed   By: Orvan Falconer M.D.   On: 06/15/2023 12:07   CT Cervical Spine Wo Contrast  Result Date: 06/15/2023 CLINICAL DATA:  Facial trauma, blunt. Mechanical fall with head strike. EXAM: CT HEAD WITHOUT CONTRAST CT CERVICAL SPINE WITHOUT CONTRAST TECHNIQUE: Multidetector CT imaging of the head and cervical spine was performed following the standard protocol without intravenous contrast. Multiplanar CT image reconstructions of the cervical spine were also generated. RADIATION  DOSE REDUCTION: This exam was performed according to the departmental dose-optimization program which includes automated exposure control, adjustment of the mA and/or kV according to patient size and/or use of iterative reconstruction technique. COMPARISON:  Head CT 03/15/2023. FINDINGS: CT HEAD FINDINGS Brain: No acute hemorrhage. Unchanged chronic small-vessel disease. Cortical gray-white differentiation is otherwise preserved. Prominence of the ventricles and sulci within expected range for age. No hydrocephalus or extra-axial collection. No mass effect or midline shift. Vascular: No hyperdense vessel or unexpected calcification. Skull: No calvarial fracture or suspicious bone lesion. Skull base is unremarkable. Sinuses/Orbits: No acute finding. Soft tissue in the right middle ear cavity and external auditory canal with associated bony erosions, suspicious for acquired cholesteatoma. Other: Right parietal scalp hematoma. CT CERVICAL SPINE FINDINGS Alignment: Normal. Skull base and vertebrae: No acute fracture. Normal craniocervical junction. No suspicious bone lesions. Soft tissues and spinal canal: No prevertebral fluid or swelling. No visible canal hematoma. Disc levels: Mild cervical spondylosis without high-grade spinal canal stenosis. Upper chest: No acute findings. Biapical pleural-parenchymal scarring. Other: Atherosclerotic calcifications of the carotid bulbs. IMPRESSION: 1. No acute intracranial abnormality. Right parietal scalp hematoma. 2. No acute cervical spine fracture or traumatic listhesis. 3. Soft tissue in the right middle ear cavity and external auditory canal with associated bony erosions, suspicious for acquired cholesteatoma. ENT consultation is recommended. Electronically Signed   By: Elwyn Reach.D.  On: 06/15/2023 12:07    Procedures Procedures  {Document cardiac monitor, telemetry assessment procedure when appropriate:1}  Medications Ordered in ED Medications  acetaminophen  (TYLENOL) tablet 650 mg (has no administration in time range)    ED Course/ Medical Decision Making/ A&P   {   Click here for ABCD2, HEART and other calculatorsREFRESH Note before signing :1}                              Medical Decision Making Amount and/or Complexity of Data Reviewed Radiology: ordered.  Risk OTC drugs.   Patient with contusion to head with no significant injury.  Questionable tumor to right ear canal.  She is referred to ENT  {Document critical care time when appropriate:1} {Document review of labs and clinical decision tools ie heart score, Chads2Vasc2 etc:1}  {Document your independent review of radiology images, and any outside records:1} {Document your discussion with family members, caretakers, and with consultants:1} {Document social determinants of health affecting pt's care:1} {Document your decision making why or why not admission, treatments were needed:1} Final Clinical Impression(s) / ED Diagnoses Final diagnoses:  Injury of head, initial encounter    Rx / DC Orders ED Discharge Orders     None

## 2023-06-15 NOTE — Discharge Instructions (Signed)
Follow-up with Dr. Jearld Fenton in the next 2 to 4 weeks for that lesion in the right ear.  Take Tylenol for pain

## 2023-06-15 NOTE — ED Triage Notes (Addendum)
Pt BIB EMS from [place] after mechanical fall. Per staff pt tripped on a rug. Hematoma to back of head, pt on Eliquis. Hx dementia  Pt given unknown amount of Tylenol at facility

## 2023-06-16 DIAGNOSIS — S0990XA Unspecified injury of head, initial encounter: Secondary | ICD-10-CM | POA: Diagnosis not present

## 2023-06-16 DIAGNOSIS — S7292XD Unspecified fracture of left femur, subsequent encounter for closed fracture with routine healing: Secondary | ICD-10-CM | POA: Diagnosis not present

## 2023-06-16 DIAGNOSIS — R278 Other lack of coordination: Secondary | ICD-10-CM | POA: Diagnosis not present

## 2023-06-16 DIAGNOSIS — M6281 Muscle weakness (generalized): Secondary | ICD-10-CM | POA: Diagnosis not present

## 2023-06-17 DIAGNOSIS — F0390 Unspecified dementia without behavioral disturbance: Secondary | ICD-10-CM | POA: Diagnosis not present

## 2023-06-17 DIAGNOSIS — S7292XD Unspecified fracture of left femur, subsequent encounter for closed fracture with routine healing: Secondary | ICD-10-CM | POA: Diagnosis not present

## 2023-06-17 DIAGNOSIS — W19XXXD Unspecified fall, subsequent encounter: Secondary | ICD-10-CM | POA: Diagnosis not present

## 2023-06-17 DIAGNOSIS — I10 Essential (primary) hypertension: Secondary | ICD-10-CM | POA: Diagnosis not present

## 2023-06-17 DIAGNOSIS — R278 Other lack of coordination: Secondary | ICD-10-CM | POA: Diagnosis not present

## 2023-06-17 DIAGNOSIS — M6281 Muscle weakness (generalized): Secondary | ICD-10-CM | POA: Diagnosis not present

## 2023-06-17 DIAGNOSIS — N390 Urinary tract infection, site not specified: Secondary | ICD-10-CM | POA: Diagnosis not present

## 2023-06-17 DIAGNOSIS — K219 Gastro-esophageal reflux disease without esophagitis: Secondary | ICD-10-CM | POA: Diagnosis not present

## 2023-06-18 DIAGNOSIS — E46 Unspecified protein-calorie malnutrition: Secondary | ICD-10-CM | POA: Diagnosis not present

## 2023-06-18 DIAGNOSIS — N390 Urinary tract infection, site not specified: Secondary | ICD-10-CM | POA: Diagnosis not present

## 2023-06-18 DIAGNOSIS — F0390 Unspecified dementia without behavioral disturbance: Secondary | ICD-10-CM | POA: Diagnosis not present

## 2023-06-23 DIAGNOSIS — I82401 Acute embolism and thrombosis of unspecified deep veins of right lower extremity: Secondary | ICD-10-CM | POA: Diagnosis not present

## 2023-06-23 DIAGNOSIS — I10 Essential (primary) hypertension: Secondary | ICD-10-CM | POA: Diagnosis not present

## 2023-06-23 DIAGNOSIS — K219 Gastro-esophageal reflux disease without esophagitis: Secondary | ICD-10-CM | POA: Diagnosis not present

## 2023-06-23 DIAGNOSIS — F3341 Major depressive disorder, recurrent, in partial remission: Secondary | ICD-10-CM | POA: Diagnosis not present

## 2023-06-23 DIAGNOSIS — N390 Urinary tract infection, site not specified: Secondary | ICD-10-CM | POA: Diagnosis not present

## 2023-06-23 DIAGNOSIS — F0390 Unspecified dementia without behavioral disturbance: Secondary | ICD-10-CM | POA: Diagnosis not present

## 2023-06-29 DIAGNOSIS — M9702XD Periprosthetic fracture around internal prosthetic left hip joint, subsequent encounter: Secondary | ICD-10-CM | POA: Diagnosis not present

## 2023-06-29 DIAGNOSIS — M1712 Unilateral primary osteoarthritis, left knee: Secondary | ICD-10-CM | POA: Diagnosis not present

## 2023-07-02 DIAGNOSIS — S0003XA Contusion of scalp, initial encounter: Secondary | ICD-10-CM | POA: Diagnosis not present

## 2023-07-02 DIAGNOSIS — E46 Unspecified protein-calorie malnutrition: Secondary | ICD-10-CM | POA: Diagnosis not present

## 2023-07-02 DIAGNOSIS — F0390 Unspecified dementia without behavioral disturbance: Secondary | ICD-10-CM | POA: Diagnosis not present

## 2023-07-02 DIAGNOSIS — N390 Urinary tract infection, site not specified: Secondary | ICD-10-CM | POA: Diagnosis not present

## 2023-07-13 DIAGNOSIS — M6281 Muscle weakness (generalized): Secondary | ICD-10-CM | POA: Diagnosis not present

## 2023-07-13 DIAGNOSIS — S7292XD Unspecified fracture of left femur, subsequent encounter for closed fracture with routine healing: Secondary | ICD-10-CM | POA: Diagnosis not present

## 2023-07-13 DIAGNOSIS — R278 Other lack of coordination: Secondary | ICD-10-CM | POA: Diagnosis not present

## 2023-07-13 DIAGNOSIS — R262 Difficulty in walking, not elsewhere classified: Secondary | ICD-10-CM | POA: Diagnosis not present

## 2023-07-14 DIAGNOSIS — E440 Moderate protein-calorie malnutrition: Secondary | ICD-10-CM | POA: Diagnosis not present

## 2023-07-14 DIAGNOSIS — R262 Difficulty in walking, not elsewhere classified: Secondary | ICD-10-CM | POA: Diagnosis not present

## 2023-07-14 DIAGNOSIS — R278 Other lack of coordination: Secondary | ICD-10-CM | POA: Diagnosis not present

## 2023-07-14 DIAGNOSIS — S7292XD Unspecified fracture of left femur, subsequent encounter for closed fracture with routine healing: Secondary | ICD-10-CM | POA: Diagnosis not present

## 2023-07-14 DIAGNOSIS — M6281 Muscle weakness (generalized): Secondary | ICD-10-CM | POA: Diagnosis not present

## 2023-07-14 DIAGNOSIS — F0390 Unspecified dementia without behavioral disturbance: Secondary | ICD-10-CM | POA: Diagnosis not present

## 2023-07-14 DIAGNOSIS — K219 Gastro-esophageal reflux disease without esophagitis: Secondary | ICD-10-CM | POA: Diagnosis not present

## 2023-07-15 DIAGNOSIS — S7292XD Unspecified fracture of left femur, subsequent encounter for closed fracture with routine healing: Secondary | ICD-10-CM | POA: Diagnosis not present

## 2023-07-15 DIAGNOSIS — R278 Other lack of coordination: Secondary | ICD-10-CM | POA: Diagnosis not present

## 2023-07-15 DIAGNOSIS — R262 Difficulty in walking, not elsewhere classified: Secondary | ICD-10-CM | POA: Diagnosis not present

## 2023-07-15 DIAGNOSIS — M6281 Muscle weakness (generalized): Secondary | ICD-10-CM | POA: Diagnosis not present

## 2023-07-16 DIAGNOSIS — S7292XD Unspecified fracture of left femur, subsequent encounter for closed fracture with routine healing: Secondary | ICD-10-CM | POA: Diagnosis not present

## 2023-07-16 DIAGNOSIS — M6281 Muscle weakness (generalized): Secondary | ICD-10-CM | POA: Diagnosis not present

## 2023-07-16 DIAGNOSIS — R278 Other lack of coordination: Secondary | ICD-10-CM | POA: Diagnosis not present

## 2023-07-16 DIAGNOSIS — R262 Difficulty in walking, not elsewhere classified: Secondary | ICD-10-CM | POA: Diagnosis not present

## 2023-07-18 DIAGNOSIS — I82409 Acute embolism and thrombosis of unspecified deep veins of unspecified lower extremity: Secondary | ICD-10-CM | POA: Diagnosis not present

## 2023-07-18 DIAGNOSIS — R262 Difficulty in walking, not elsewhere classified: Secondary | ICD-10-CM | POA: Diagnosis not present

## 2023-07-18 DIAGNOSIS — R293 Abnormal posture: Secondary | ICD-10-CM | POA: Diagnosis not present

## 2023-07-18 DIAGNOSIS — F0390 Unspecified dementia without behavioral disturbance: Secondary | ICD-10-CM | POA: Diagnosis not present

## 2023-07-18 DIAGNOSIS — M6281 Muscle weakness (generalized): Secondary | ICD-10-CM | POA: Diagnosis not present

## 2023-07-18 DIAGNOSIS — S7292XD Unspecified fracture of left femur, subsequent encounter for closed fracture with routine healing: Secondary | ICD-10-CM | POA: Diagnosis not present

## 2023-07-18 DIAGNOSIS — Z741 Need for assistance with personal care: Secondary | ICD-10-CM | POA: Diagnosis not present

## 2023-07-18 DIAGNOSIS — R278 Other lack of coordination: Secondary | ICD-10-CM | POA: Diagnosis not present

## 2023-07-18 DIAGNOSIS — J188 Other pneumonia, unspecified organism: Secondary | ICD-10-CM | POA: Diagnosis not present

## 2023-07-19 DIAGNOSIS — M6281 Muscle weakness (generalized): Secondary | ICD-10-CM | POA: Diagnosis not present

## 2023-07-19 DIAGNOSIS — R262 Difficulty in walking, not elsewhere classified: Secondary | ICD-10-CM | POA: Diagnosis not present

## 2023-07-19 DIAGNOSIS — S7292XD Unspecified fracture of left femur, subsequent encounter for closed fracture with routine healing: Secondary | ICD-10-CM | POA: Diagnosis not present

## 2023-07-19 DIAGNOSIS — R278 Other lack of coordination: Secondary | ICD-10-CM | POA: Diagnosis not present

## 2023-07-20 ENCOUNTER — Institutional Professional Consult (permissible substitution) (INDEPENDENT_AMBULATORY_CARE_PROVIDER_SITE_OTHER): Payer: Medicare PPO | Admitting: Otolaryngology

## 2023-07-20 DIAGNOSIS — S7292XS Unspecified fracture of left femur, sequela: Secondary | ICD-10-CM | POA: Diagnosis not present

## 2023-07-20 DIAGNOSIS — F0390 Unspecified dementia without behavioral disturbance: Secondary | ICD-10-CM | POA: Diagnosis not present

## 2023-07-20 DIAGNOSIS — J188 Other pneumonia, unspecified organism: Secondary | ICD-10-CM | POA: Diagnosis not present

## 2023-07-20 DIAGNOSIS — R293 Abnormal posture: Secondary | ICD-10-CM | POA: Diagnosis not present

## 2023-07-20 DIAGNOSIS — R262 Difficulty in walking, not elsewhere classified: Secondary | ICD-10-CM | POA: Diagnosis not present

## 2023-07-20 DIAGNOSIS — I82409 Acute embolism and thrombosis of unspecified deep veins of unspecified lower extremity: Secondary | ICD-10-CM | POA: Diagnosis not present

## 2023-07-20 DIAGNOSIS — M6281 Muscle weakness (generalized): Secondary | ICD-10-CM | POA: Diagnosis not present

## 2023-07-20 DIAGNOSIS — R41841 Cognitive communication deficit: Secondary | ICD-10-CM | POA: Diagnosis not present

## 2023-07-20 DIAGNOSIS — S7292XD Unspecified fracture of left femur, subsequent encounter for closed fracture with routine healing: Secondary | ICD-10-CM | POA: Diagnosis not present

## 2023-07-20 DIAGNOSIS — Z741 Need for assistance with personal care: Secondary | ICD-10-CM | POA: Diagnosis not present

## 2023-07-23 DIAGNOSIS — R262 Difficulty in walking, not elsewhere classified: Secondary | ICD-10-CM | POA: Diagnosis not present

## 2023-07-23 DIAGNOSIS — S7292XD Unspecified fracture of left femur, subsequent encounter for closed fracture with routine healing: Secondary | ICD-10-CM | POA: Diagnosis not present

## 2023-07-26 DIAGNOSIS — R262 Difficulty in walking, not elsewhere classified: Secondary | ICD-10-CM | POA: Diagnosis not present

## 2023-07-26 DIAGNOSIS — S7292XD Unspecified fracture of left femur, subsequent encounter for closed fracture with routine healing: Secondary | ICD-10-CM | POA: Diagnosis not present

## 2023-07-27 DIAGNOSIS — R262 Difficulty in walking, not elsewhere classified: Secondary | ICD-10-CM | POA: Diagnosis not present

## 2023-07-27 DIAGNOSIS — S7292XD Unspecified fracture of left femur, subsequent encounter for closed fracture with routine healing: Secondary | ICD-10-CM | POA: Diagnosis not present

## 2023-07-28 DIAGNOSIS — S7292XD Unspecified fracture of left femur, subsequent encounter for closed fracture with routine healing: Secondary | ICD-10-CM | POA: Diagnosis not present

## 2023-07-28 DIAGNOSIS — R262 Difficulty in walking, not elsewhere classified: Secondary | ICD-10-CM | POA: Diagnosis not present

## 2023-07-30 DIAGNOSIS — M25562 Pain in left knee: Secondary | ICD-10-CM | POA: Diagnosis not present

## 2023-07-30 DIAGNOSIS — S7292XD Unspecified fracture of left femur, subsequent encounter for closed fracture with routine healing: Secondary | ICD-10-CM | POA: Diagnosis not present

## 2023-07-30 DIAGNOSIS — R262 Difficulty in walking, not elsewhere classified: Secondary | ICD-10-CM | POA: Diagnosis not present

## 2023-07-31 DIAGNOSIS — R262 Difficulty in walking, not elsewhere classified: Secondary | ICD-10-CM | POA: Diagnosis not present

## 2023-07-31 DIAGNOSIS — S7292XD Unspecified fracture of left femur, subsequent encounter for closed fracture with routine healing: Secondary | ICD-10-CM | POA: Diagnosis not present

## 2023-08-02 DIAGNOSIS — R262 Difficulty in walking, not elsewhere classified: Secondary | ICD-10-CM | POA: Diagnosis not present

## 2023-08-02 DIAGNOSIS — S7292XD Unspecified fracture of left femur, subsequent encounter for closed fracture with routine healing: Secondary | ICD-10-CM | POA: Diagnosis not present

## 2023-08-04 DIAGNOSIS — R262 Difficulty in walking, not elsewhere classified: Secondary | ICD-10-CM | POA: Diagnosis not present

## 2023-08-04 DIAGNOSIS — S7292XD Unspecified fracture of left femur, subsequent encounter for closed fracture with routine healing: Secondary | ICD-10-CM | POA: Diagnosis not present

## 2023-08-06 DIAGNOSIS — S7292XD Unspecified fracture of left femur, subsequent encounter for closed fracture with routine healing: Secondary | ICD-10-CM | POA: Diagnosis not present

## 2023-08-06 DIAGNOSIS — R262 Difficulty in walking, not elsewhere classified: Secondary | ICD-10-CM | POA: Diagnosis not present

## 2023-08-08 DIAGNOSIS — Z7901 Long term (current) use of anticoagulants: Secondary | ICD-10-CM | POA: Diagnosis not present

## 2023-08-08 DIAGNOSIS — M25469 Effusion, unspecified knee: Secondary | ICD-10-CM | POA: Diagnosis not present

## 2023-08-08 DIAGNOSIS — I10 Essential (primary) hypertension: Secondary | ICD-10-CM | POA: Diagnosis not present

## 2023-08-08 DIAGNOSIS — M7989 Other specified soft tissue disorders: Secondary | ICD-10-CM | POA: Diagnosis not present

## 2023-08-08 DIAGNOSIS — M25552 Pain in left hip: Secondary | ICD-10-CM | POA: Diagnosis not present

## 2023-08-08 DIAGNOSIS — S728X2D Other fracture of left femur, subsequent encounter for closed fracture with routine healing: Secondary | ICD-10-CM | POA: Diagnosis not present

## 2023-08-08 DIAGNOSIS — Z79899 Other long term (current) drug therapy: Secondary | ICD-10-CM | POA: Diagnosis not present

## 2023-08-08 DIAGNOSIS — M25462 Effusion, left knee: Secondary | ICD-10-CM | POA: Diagnosis not present

## 2023-08-08 DIAGNOSIS — S72402D Unspecified fracture of lower end of left femur, subsequent encounter for closed fracture with routine healing: Secondary | ICD-10-CM | POA: Diagnosis not present

## 2023-08-08 DIAGNOSIS — M8588 Other specified disorders of bone density and structure, other site: Secondary | ICD-10-CM | POA: Diagnosis not present

## 2023-08-08 DIAGNOSIS — Z86718 Personal history of other venous thrombosis and embolism: Secondary | ICD-10-CM | POA: Diagnosis not present

## 2023-08-09 DIAGNOSIS — S7292XD Unspecified fracture of left femur, subsequent encounter for closed fracture with routine healing: Secondary | ICD-10-CM | POA: Diagnosis not present

## 2023-08-09 DIAGNOSIS — R262 Difficulty in walking, not elsewhere classified: Secondary | ICD-10-CM | POA: Diagnosis not present

## 2023-08-10 DIAGNOSIS — S7292XD Unspecified fracture of left femur, subsequent encounter for closed fracture with routine healing: Secondary | ICD-10-CM | POA: Diagnosis not present

## 2023-08-10 DIAGNOSIS — R262 Difficulty in walking, not elsewhere classified: Secondary | ICD-10-CM | POA: Diagnosis not present

## 2023-08-11 DIAGNOSIS — S7292XD Unspecified fracture of left femur, subsequent encounter for closed fracture with routine healing: Secondary | ICD-10-CM | POA: Diagnosis not present

## 2023-08-11 DIAGNOSIS — R262 Difficulty in walking, not elsewhere classified: Secondary | ICD-10-CM | POA: Diagnosis not present

## 2023-08-12 DIAGNOSIS — R262 Difficulty in walking, not elsewhere classified: Secondary | ICD-10-CM | POA: Diagnosis not present

## 2023-08-12 DIAGNOSIS — S7292XD Unspecified fracture of left femur, subsequent encounter for closed fracture with routine healing: Secondary | ICD-10-CM | POA: Diagnosis not present

## 2023-08-13 DIAGNOSIS — S7292XD Unspecified fracture of left femur, subsequent encounter for closed fracture with routine healing: Secondary | ICD-10-CM | POA: Diagnosis not present

## 2023-08-13 DIAGNOSIS — R262 Difficulty in walking, not elsewhere classified: Secondary | ICD-10-CM | POA: Diagnosis not present

## 2023-08-16 DIAGNOSIS — F0390 Unspecified dementia without behavioral disturbance: Secondary | ICD-10-CM | POA: Diagnosis not present

## 2023-08-16 DIAGNOSIS — M9702XD Periprosthetic fracture around internal prosthetic left hip joint, subsequent encounter: Secondary | ICD-10-CM | POA: Diagnosis not present

## 2023-08-16 DIAGNOSIS — R41841 Cognitive communication deficit: Secondary | ICD-10-CM | POA: Diagnosis not present

## 2023-08-16 DIAGNOSIS — R293 Abnormal posture: Secondary | ICD-10-CM | POA: Diagnosis not present

## 2023-08-16 DIAGNOSIS — Z741 Need for assistance with personal care: Secondary | ICD-10-CM | POA: Diagnosis not present

## 2023-08-16 DIAGNOSIS — R262 Difficulty in walking, not elsewhere classified: Secondary | ICD-10-CM | POA: Diagnosis not present

## 2023-08-16 DIAGNOSIS — I82409 Acute embolism and thrombosis of unspecified deep veins of unspecified lower extremity: Secondary | ICD-10-CM | POA: Diagnosis not present

## 2023-08-16 DIAGNOSIS — S7292XD Unspecified fracture of left femur, subsequent encounter for closed fracture with routine healing: Secondary | ICD-10-CM | POA: Diagnosis not present

## 2023-08-16 DIAGNOSIS — M25562 Pain in left knee: Secondary | ICD-10-CM | POA: Diagnosis not present

## 2023-08-16 DIAGNOSIS — J188 Other pneumonia, unspecified organism: Secondary | ICD-10-CM | POA: Diagnosis not present

## 2023-08-16 DIAGNOSIS — M6281 Muscle weakness (generalized): Secondary | ICD-10-CM | POA: Diagnosis not present

## 2023-08-16 DIAGNOSIS — S7292XS Unspecified fracture of left femur, sequela: Secondary | ICD-10-CM | POA: Diagnosis not present

## 2023-08-17 DIAGNOSIS — R262 Difficulty in walking, not elsewhere classified: Secondary | ICD-10-CM | POA: Diagnosis not present

## 2023-08-17 DIAGNOSIS — S7292XD Unspecified fracture of left femur, subsequent encounter for closed fracture with routine healing: Secondary | ICD-10-CM | POA: Diagnosis not present

## 2023-08-24 DIAGNOSIS — F0390 Unspecified dementia without behavioral disturbance: Secondary | ICD-10-CM | POA: Diagnosis not present

## 2023-08-24 DIAGNOSIS — I10 Essential (primary) hypertension: Secondary | ICD-10-CM | POA: Diagnosis not present

## 2023-08-27 DIAGNOSIS — R41841 Cognitive communication deficit: Secondary | ICD-10-CM | POA: Diagnosis not present

## 2023-08-27 DIAGNOSIS — I10 Essential (primary) hypertension: Secondary | ICD-10-CM | POA: Diagnosis not present

## 2023-08-27 DIAGNOSIS — Z9889 Other specified postprocedural states: Secondary | ICD-10-CM | POA: Diagnosis not present

## 2023-08-27 DIAGNOSIS — I82409 Acute embolism and thrombosis of unspecified deep veins of unspecified lower extremity: Secondary | ICD-10-CM | POA: Diagnosis not present

## 2023-08-27 DIAGNOSIS — R293 Abnormal posture: Secondary | ICD-10-CM | POA: Diagnosis not present

## 2023-08-27 DIAGNOSIS — S7292XS Unspecified fracture of left femur, sequela: Secondary | ICD-10-CM | POA: Diagnosis not present

## 2023-08-27 DIAGNOSIS — Z7901 Long term (current) use of anticoagulants: Secondary | ICD-10-CM | POA: Diagnosis not present

## 2023-08-27 DIAGNOSIS — J188 Other pneumonia, unspecified organism: Secondary | ICD-10-CM | POA: Diagnosis not present

## 2023-08-27 DIAGNOSIS — Z741 Need for assistance with personal care: Secondary | ICD-10-CM | POA: Diagnosis not present

## 2023-08-27 DIAGNOSIS — M6281 Muscle weakness (generalized): Secondary | ICD-10-CM | POA: Diagnosis not present

## 2023-08-27 DIAGNOSIS — F0390 Unspecified dementia without behavioral disturbance: Secondary | ICD-10-CM | POA: Diagnosis not present

## 2023-08-31 DIAGNOSIS — S72302D Unspecified fracture of shaft of left femur, subsequent encounter for closed fracture with routine healing: Secondary | ICD-10-CM | POA: Diagnosis not present

## 2023-09-02 DIAGNOSIS — R52 Pain, unspecified: Secondary | ICD-10-CM | POA: Diagnosis not present

## 2023-09-06 ENCOUNTER — Institutional Professional Consult (permissible substitution) (INDEPENDENT_AMBULATORY_CARE_PROVIDER_SITE_OTHER): Payer: Medicare PPO | Admitting: Otolaryngology

## 2023-09-07 DIAGNOSIS — M25562 Pain in left knee: Secondary | ICD-10-CM | POA: Diagnosis not present

## 2023-09-08 DIAGNOSIS — I82409 Acute embolism and thrombosis of unspecified deep veins of unspecified lower extremity: Secondary | ICD-10-CM | POA: Diagnosis not present

## 2023-09-08 DIAGNOSIS — S7292XS Unspecified fracture of left femur, sequela: Secondary | ICD-10-CM | POA: Diagnosis not present

## 2023-09-08 DIAGNOSIS — F0390 Unspecified dementia without behavioral disturbance: Secondary | ICD-10-CM | POA: Diagnosis not present

## 2023-09-08 DIAGNOSIS — Z741 Need for assistance with personal care: Secondary | ICD-10-CM | POA: Diagnosis not present

## 2023-09-08 DIAGNOSIS — R293 Abnormal posture: Secondary | ICD-10-CM | POA: Diagnosis not present

## 2023-09-08 DIAGNOSIS — J188 Other pneumonia, unspecified organism: Secondary | ICD-10-CM | POA: Diagnosis not present

## 2023-09-08 DIAGNOSIS — S7292XD Unspecified fracture of left femur, subsequent encounter for closed fracture with routine healing: Secondary | ICD-10-CM | POA: Diagnosis not present

## 2023-09-08 DIAGNOSIS — M6281 Muscle weakness (generalized): Secondary | ICD-10-CM | POA: Diagnosis not present

## 2023-09-08 DIAGNOSIS — R41841 Cognitive communication deficit: Secondary | ICD-10-CM | POA: Diagnosis not present

## 2023-09-12 DIAGNOSIS — I82409 Acute embolism and thrombosis of unspecified deep veins of unspecified lower extremity: Secondary | ICD-10-CM | POA: Diagnosis not present

## 2023-09-12 DIAGNOSIS — S7292XS Unspecified fracture of left femur, sequela: Secondary | ICD-10-CM | POA: Diagnosis not present

## 2023-09-12 DIAGNOSIS — M6281 Muscle weakness (generalized): Secondary | ICD-10-CM | POA: Diagnosis not present

## 2023-09-12 DIAGNOSIS — Z741 Need for assistance with personal care: Secondary | ICD-10-CM | POA: Diagnosis not present

## 2023-09-12 DIAGNOSIS — R41841 Cognitive communication deficit: Secondary | ICD-10-CM | POA: Diagnosis not present

## 2023-09-12 DIAGNOSIS — R293 Abnormal posture: Secondary | ICD-10-CM | POA: Diagnosis not present

## 2023-09-12 DIAGNOSIS — F0390 Unspecified dementia without behavioral disturbance: Secondary | ICD-10-CM | POA: Diagnosis not present

## 2023-09-12 DIAGNOSIS — I10 Essential (primary) hypertension: Secondary | ICD-10-CM | POA: Diagnosis not present

## 2023-09-12 DIAGNOSIS — J188 Other pneumonia, unspecified organism: Secondary | ICD-10-CM | POA: Diagnosis not present

## 2023-09-17 DIAGNOSIS — J188 Other pneumonia, unspecified organism: Secondary | ICD-10-CM | POA: Diagnosis not present

## 2023-09-17 DIAGNOSIS — I82409 Acute embolism and thrombosis of unspecified deep veins of unspecified lower extremity: Secondary | ICD-10-CM | POA: Diagnosis not present

## 2023-09-17 DIAGNOSIS — S7292XS Unspecified fracture of left femur, sequela: Secondary | ICD-10-CM | POA: Diagnosis not present

## 2023-09-17 DIAGNOSIS — F0390 Unspecified dementia without behavioral disturbance: Secondary | ICD-10-CM | POA: Diagnosis not present

## 2023-09-17 DIAGNOSIS — R293 Abnormal posture: Secondary | ICD-10-CM | POA: Diagnosis not present

## 2023-09-17 DIAGNOSIS — M6281 Muscle weakness (generalized): Secondary | ICD-10-CM | POA: Diagnosis not present

## 2023-09-17 DIAGNOSIS — R41841 Cognitive communication deficit: Secondary | ICD-10-CM | POA: Diagnosis not present

## 2023-09-17 DIAGNOSIS — Z741 Need for assistance with personal care: Secondary | ICD-10-CM | POA: Diagnosis not present

## 2023-09-17 DIAGNOSIS — S7292XD Unspecified fracture of left femur, subsequent encounter for closed fracture with routine healing: Secondary | ICD-10-CM | POA: Diagnosis not present

## 2023-09-20 DIAGNOSIS — S7292XD Unspecified fracture of left femur, subsequent encounter for closed fracture with routine healing: Secondary | ICD-10-CM | POA: Diagnosis not present

## 2023-09-20 DIAGNOSIS — R262 Difficulty in walking, not elsewhere classified: Secondary | ICD-10-CM | POA: Diagnosis not present

## 2023-09-20 DIAGNOSIS — Z9181 History of falling: Secondary | ICD-10-CM | POA: Diagnosis not present

## 2023-09-22 DIAGNOSIS — S7292XD Unspecified fracture of left femur, subsequent encounter for closed fracture with routine healing: Secondary | ICD-10-CM | POA: Diagnosis not present

## 2023-09-22 DIAGNOSIS — R262 Difficulty in walking, not elsewhere classified: Secondary | ICD-10-CM | POA: Diagnosis not present

## 2023-09-22 DIAGNOSIS — Z9181 History of falling: Secondary | ICD-10-CM | POA: Diagnosis not present

## 2023-09-23 DIAGNOSIS — R262 Difficulty in walking, not elsewhere classified: Secondary | ICD-10-CM | POA: Diagnosis not present

## 2023-09-23 DIAGNOSIS — Z9181 History of falling: Secondary | ICD-10-CM | POA: Diagnosis not present

## 2023-09-23 DIAGNOSIS — S7292XD Unspecified fracture of left femur, subsequent encounter for closed fracture with routine healing: Secondary | ICD-10-CM | POA: Diagnosis not present

## 2023-09-24 DIAGNOSIS — Z9181 History of falling: Secondary | ICD-10-CM | POA: Diagnosis not present

## 2023-09-24 DIAGNOSIS — S7292XD Unspecified fracture of left femur, subsequent encounter for closed fracture with routine healing: Secondary | ICD-10-CM | POA: Diagnosis not present

## 2023-09-24 DIAGNOSIS — R262 Difficulty in walking, not elsewhere classified: Secondary | ICD-10-CM | POA: Diagnosis not present

## 2023-09-27 DIAGNOSIS — R262 Difficulty in walking, not elsewhere classified: Secondary | ICD-10-CM | POA: Diagnosis not present

## 2023-09-27 DIAGNOSIS — Z9181 History of falling: Secondary | ICD-10-CM | POA: Diagnosis not present

## 2023-09-27 DIAGNOSIS — S7292XD Unspecified fracture of left femur, subsequent encounter for closed fracture with routine healing: Secondary | ICD-10-CM | POA: Diagnosis not present

## 2023-09-28 DIAGNOSIS — R262 Difficulty in walking, not elsewhere classified: Secondary | ICD-10-CM | POA: Diagnosis not present

## 2023-09-28 DIAGNOSIS — S7292XD Unspecified fracture of left femur, subsequent encounter for closed fracture with routine healing: Secondary | ICD-10-CM | POA: Diagnosis not present

## 2023-09-28 DIAGNOSIS — Z9181 History of falling: Secondary | ICD-10-CM | POA: Diagnosis not present

## 2023-09-29 DIAGNOSIS — S7292XD Unspecified fracture of left femur, subsequent encounter for closed fracture with routine healing: Secondary | ICD-10-CM | POA: Diagnosis not present

## 2023-09-29 DIAGNOSIS — R262 Difficulty in walking, not elsewhere classified: Secondary | ICD-10-CM | POA: Diagnosis not present

## 2023-09-29 DIAGNOSIS — Z9181 History of falling: Secondary | ICD-10-CM | POA: Diagnosis not present

## 2023-09-30 DIAGNOSIS — Z9181 History of falling: Secondary | ICD-10-CM | POA: Diagnosis not present

## 2023-09-30 DIAGNOSIS — R262 Difficulty in walking, not elsewhere classified: Secondary | ICD-10-CM | POA: Diagnosis not present

## 2023-09-30 DIAGNOSIS — S7292XD Unspecified fracture of left femur, subsequent encounter for closed fracture with routine healing: Secondary | ICD-10-CM | POA: Diagnosis not present

## 2023-10-01 DIAGNOSIS — Z9181 History of falling: Secondary | ICD-10-CM | POA: Diagnosis not present

## 2023-10-01 DIAGNOSIS — S7292XD Unspecified fracture of left femur, subsequent encounter for closed fracture with routine healing: Secondary | ICD-10-CM | POA: Diagnosis not present

## 2023-10-01 DIAGNOSIS — R262 Difficulty in walking, not elsewhere classified: Secondary | ICD-10-CM | POA: Diagnosis not present

## 2023-10-04 DIAGNOSIS — R262 Difficulty in walking, not elsewhere classified: Secondary | ICD-10-CM | POA: Diagnosis not present

## 2023-10-04 DIAGNOSIS — S7292XD Unspecified fracture of left femur, subsequent encounter for closed fracture with routine healing: Secondary | ICD-10-CM | POA: Diagnosis not present

## 2023-10-04 DIAGNOSIS — H401122 Primary open-angle glaucoma, left eye, moderate stage: Secondary | ICD-10-CM | POA: Diagnosis not present

## 2023-10-04 DIAGNOSIS — Z9181 History of falling: Secondary | ICD-10-CM | POA: Diagnosis not present

## 2023-10-04 DIAGNOSIS — H401113 Primary open-angle glaucoma, right eye, severe stage: Secondary | ICD-10-CM | POA: Diagnosis not present

## 2023-10-05 DIAGNOSIS — S7292XD Unspecified fracture of left femur, subsequent encounter for closed fracture with routine healing: Secondary | ICD-10-CM | POA: Diagnosis not present

## 2023-10-05 DIAGNOSIS — Z9181 History of falling: Secondary | ICD-10-CM | POA: Diagnosis not present

## 2023-10-05 DIAGNOSIS — R262 Difficulty in walking, not elsewhere classified: Secondary | ICD-10-CM | POA: Diagnosis not present

## 2023-10-06 DIAGNOSIS — R262 Difficulty in walking, not elsewhere classified: Secondary | ICD-10-CM | POA: Diagnosis not present

## 2023-10-06 DIAGNOSIS — S7292XD Unspecified fracture of left femur, subsequent encounter for closed fracture with routine healing: Secondary | ICD-10-CM | POA: Diagnosis not present

## 2023-10-06 DIAGNOSIS — Z9181 History of falling: Secondary | ICD-10-CM | POA: Diagnosis not present

## 2023-10-07 DIAGNOSIS — S7292XD Unspecified fracture of left femur, subsequent encounter for closed fracture with routine healing: Secondary | ICD-10-CM | POA: Diagnosis not present

## 2023-10-07 DIAGNOSIS — R262 Difficulty in walking, not elsewhere classified: Secondary | ICD-10-CM | POA: Diagnosis not present

## 2023-10-07 DIAGNOSIS — Z9181 History of falling: Secondary | ICD-10-CM | POA: Diagnosis not present

## 2023-10-11 DIAGNOSIS — R262 Difficulty in walking, not elsewhere classified: Secondary | ICD-10-CM | POA: Diagnosis not present

## 2023-10-11 DIAGNOSIS — Z9181 History of falling: Secondary | ICD-10-CM | POA: Diagnosis not present

## 2023-10-11 DIAGNOSIS — S7292XD Unspecified fracture of left femur, subsequent encounter for closed fracture with routine healing: Secondary | ICD-10-CM | POA: Diagnosis not present

## 2023-10-12 DIAGNOSIS — R262 Difficulty in walking, not elsewhere classified: Secondary | ICD-10-CM | POA: Diagnosis not present

## 2023-10-12 DIAGNOSIS — S7292XD Unspecified fracture of left femur, subsequent encounter for closed fracture with routine healing: Secondary | ICD-10-CM | POA: Diagnosis not present

## 2023-10-12 DIAGNOSIS — Z9181 History of falling: Secondary | ICD-10-CM | POA: Diagnosis not present

## 2023-10-14 DIAGNOSIS — S7292XD Unspecified fracture of left femur, subsequent encounter for closed fracture with routine healing: Secondary | ICD-10-CM | POA: Diagnosis not present

## 2023-10-14 DIAGNOSIS — R262 Difficulty in walking, not elsewhere classified: Secondary | ICD-10-CM | POA: Diagnosis not present

## 2023-10-14 DIAGNOSIS — Z9181 History of falling: Secondary | ICD-10-CM | POA: Diagnosis not present

## 2023-10-15 DIAGNOSIS — S7292XD Unspecified fracture of left femur, subsequent encounter for closed fracture with routine healing: Secondary | ICD-10-CM | POA: Diagnosis not present

## 2023-10-15 DIAGNOSIS — R262 Difficulty in walking, not elsewhere classified: Secondary | ICD-10-CM | POA: Diagnosis not present

## 2023-10-15 DIAGNOSIS — Z9181 History of falling: Secondary | ICD-10-CM | POA: Diagnosis not present

## 2023-10-16 DIAGNOSIS — R293 Abnormal posture: Secondary | ICD-10-CM | POA: Diagnosis not present

## 2023-10-16 DIAGNOSIS — M6281 Muscle weakness (generalized): Secondary | ICD-10-CM | POA: Diagnosis not present

## 2023-10-16 DIAGNOSIS — Z741 Need for assistance with personal care: Secondary | ICD-10-CM | POA: Diagnosis not present

## 2023-10-16 DIAGNOSIS — F0390 Unspecified dementia without behavioral disturbance: Secondary | ICD-10-CM | POA: Diagnosis not present

## 2023-10-16 DIAGNOSIS — I82409 Acute embolism and thrombosis of unspecified deep veins of unspecified lower extremity: Secondary | ICD-10-CM | POA: Diagnosis not present

## 2023-10-16 DIAGNOSIS — R41841 Cognitive communication deficit: Secondary | ICD-10-CM | POA: Diagnosis not present

## 2023-10-16 DIAGNOSIS — S7292XS Unspecified fracture of left femur, sequela: Secondary | ICD-10-CM | POA: Diagnosis not present

## 2023-10-16 DIAGNOSIS — S7292XD Unspecified fracture of left femur, subsequent encounter for closed fracture with routine healing: Secondary | ICD-10-CM | POA: Diagnosis not present

## 2023-10-16 DIAGNOSIS — J188 Other pneumonia, unspecified organism: Secondary | ICD-10-CM | POA: Diagnosis not present

## 2023-10-18 DIAGNOSIS — R262 Difficulty in walking, not elsewhere classified: Secondary | ICD-10-CM | POA: Diagnosis not present

## 2023-10-18 DIAGNOSIS — S7292XD Unspecified fracture of left femur, subsequent encounter for closed fracture with routine healing: Secondary | ICD-10-CM | POA: Diagnosis not present

## 2023-10-18 DIAGNOSIS — Z9181 History of falling: Secondary | ICD-10-CM | POA: Diagnosis not present

## 2023-10-19 DIAGNOSIS — S7292XD Unspecified fracture of left femur, subsequent encounter for closed fracture with routine healing: Secondary | ICD-10-CM | POA: Diagnosis not present

## 2023-10-19 DIAGNOSIS — Z9181 History of falling: Secondary | ICD-10-CM | POA: Diagnosis not present

## 2023-10-19 DIAGNOSIS — R262 Difficulty in walking, not elsewhere classified: Secondary | ICD-10-CM | POA: Diagnosis not present

## 2023-10-21 DIAGNOSIS — S7292XD Unspecified fracture of left femur, subsequent encounter for closed fracture with routine healing: Secondary | ICD-10-CM | POA: Diagnosis not present

## 2023-10-21 DIAGNOSIS — R262 Difficulty in walking, not elsewhere classified: Secondary | ICD-10-CM | POA: Diagnosis not present

## 2023-10-21 DIAGNOSIS — Z9181 History of falling: Secondary | ICD-10-CM | POA: Diagnosis not present

## 2023-10-23 DIAGNOSIS — S7292XD Unspecified fracture of left femur, subsequent encounter for closed fracture with routine healing: Secondary | ICD-10-CM | POA: Diagnosis not present

## 2023-10-23 DIAGNOSIS — R262 Difficulty in walking, not elsewhere classified: Secondary | ICD-10-CM | POA: Diagnosis not present

## 2023-10-23 DIAGNOSIS — Z9181 History of falling: Secondary | ICD-10-CM | POA: Diagnosis not present

## 2023-10-25 DIAGNOSIS — S7292XD Unspecified fracture of left femur, subsequent encounter for closed fracture with routine healing: Secondary | ICD-10-CM | POA: Diagnosis not present

## 2023-10-25 DIAGNOSIS — R262 Difficulty in walking, not elsewhere classified: Secondary | ICD-10-CM | POA: Diagnosis not present

## 2023-10-25 DIAGNOSIS — Z9181 History of falling: Secondary | ICD-10-CM | POA: Diagnosis not present

## 2023-10-26 DIAGNOSIS — R262 Difficulty in walking, not elsewhere classified: Secondary | ICD-10-CM | POA: Diagnosis not present

## 2023-10-26 DIAGNOSIS — S7292XD Unspecified fracture of left femur, subsequent encounter for closed fracture with routine healing: Secondary | ICD-10-CM | POA: Diagnosis not present

## 2023-10-26 DIAGNOSIS — Z9181 History of falling: Secondary | ICD-10-CM | POA: Diagnosis not present

## 2023-10-28 DIAGNOSIS — R262 Difficulty in walking, not elsewhere classified: Secondary | ICD-10-CM | POA: Diagnosis not present

## 2023-10-28 DIAGNOSIS — Z9181 History of falling: Secondary | ICD-10-CM | POA: Diagnosis not present

## 2023-10-28 DIAGNOSIS — S7292XD Unspecified fracture of left femur, subsequent encounter for closed fracture with routine healing: Secondary | ICD-10-CM | POA: Diagnosis not present

## 2023-10-29 DIAGNOSIS — S7292XD Unspecified fracture of left femur, subsequent encounter for closed fracture with routine healing: Secondary | ICD-10-CM | POA: Diagnosis not present

## 2023-10-29 DIAGNOSIS — R262 Difficulty in walking, not elsewhere classified: Secondary | ICD-10-CM | POA: Diagnosis not present

## 2023-10-29 DIAGNOSIS — Z9181 History of falling: Secondary | ICD-10-CM | POA: Diagnosis not present

## 2023-11-01 DIAGNOSIS — S7292XD Unspecified fracture of left femur, subsequent encounter for closed fracture with routine healing: Secondary | ICD-10-CM | POA: Diagnosis not present

## 2023-11-01 DIAGNOSIS — R262 Difficulty in walking, not elsewhere classified: Secondary | ICD-10-CM | POA: Diagnosis not present

## 2023-11-01 DIAGNOSIS — Z9181 History of falling: Secondary | ICD-10-CM | POA: Diagnosis not present

## 2023-11-02 DIAGNOSIS — R262 Difficulty in walking, not elsewhere classified: Secondary | ICD-10-CM | POA: Diagnosis not present

## 2023-11-02 DIAGNOSIS — Z9181 History of falling: Secondary | ICD-10-CM | POA: Diagnosis not present

## 2023-11-02 DIAGNOSIS — S7292XD Unspecified fracture of left femur, subsequent encounter for closed fracture with routine healing: Secondary | ICD-10-CM | POA: Diagnosis not present

## 2023-11-03 DIAGNOSIS — R262 Difficulty in walking, not elsewhere classified: Secondary | ICD-10-CM | POA: Diagnosis not present

## 2023-11-03 DIAGNOSIS — Z9181 History of falling: Secondary | ICD-10-CM | POA: Diagnosis not present

## 2023-11-03 DIAGNOSIS — S7292XD Unspecified fracture of left femur, subsequent encounter for closed fracture with routine healing: Secondary | ICD-10-CM | POA: Diagnosis not present

## 2023-11-04 DIAGNOSIS — Z741 Need for assistance with personal care: Secondary | ICD-10-CM | POA: Diagnosis not present

## 2023-11-04 DIAGNOSIS — M6281 Muscle weakness (generalized): Secondary | ICD-10-CM | POA: Diagnosis not present

## 2023-11-04 DIAGNOSIS — Z9181 History of falling: Secondary | ICD-10-CM | POA: Diagnosis not present

## 2023-11-04 DIAGNOSIS — S7292XS Unspecified fracture of left femur, sequela: Secondary | ICD-10-CM | POA: Diagnosis not present

## 2023-11-04 DIAGNOSIS — J188 Other pneumonia, unspecified organism: Secondary | ICD-10-CM | POA: Diagnosis not present

## 2023-11-04 DIAGNOSIS — F0390 Unspecified dementia without behavioral disturbance: Secondary | ICD-10-CM | POA: Diagnosis not present

## 2023-11-04 DIAGNOSIS — I82409 Acute embolism and thrombosis of unspecified deep veins of unspecified lower extremity: Secondary | ICD-10-CM | POA: Diagnosis not present

## 2023-11-04 DIAGNOSIS — R262 Difficulty in walking, not elsewhere classified: Secondary | ICD-10-CM | POA: Diagnosis not present

## 2023-11-04 DIAGNOSIS — S7292XD Unspecified fracture of left femur, subsequent encounter for closed fracture with routine healing: Secondary | ICD-10-CM | POA: Diagnosis not present

## 2023-11-04 DIAGNOSIS — R293 Abnormal posture: Secondary | ICD-10-CM | POA: Diagnosis not present

## 2023-11-04 DIAGNOSIS — R41841 Cognitive communication deficit: Secondary | ICD-10-CM | POA: Diagnosis not present

## 2023-11-05 DIAGNOSIS — Z9181 History of falling: Secondary | ICD-10-CM | POA: Diagnosis not present

## 2023-11-05 DIAGNOSIS — D649 Anemia, unspecified: Secondary | ICD-10-CM | POA: Diagnosis not present

## 2023-11-05 DIAGNOSIS — S7292XD Unspecified fracture of left femur, subsequent encounter for closed fracture with routine healing: Secondary | ICD-10-CM | POA: Diagnosis not present

## 2023-11-05 DIAGNOSIS — R262 Difficulty in walking, not elsewhere classified: Secondary | ICD-10-CM | POA: Diagnosis not present

## 2023-11-09 DIAGNOSIS — R41841 Cognitive communication deficit: Secondary | ICD-10-CM | POA: Diagnosis not present

## 2023-11-09 DIAGNOSIS — R293 Abnormal posture: Secondary | ICD-10-CM | POA: Diagnosis not present

## 2023-11-09 DIAGNOSIS — J188 Other pneumonia, unspecified organism: Secondary | ICD-10-CM | POA: Diagnosis not present

## 2023-11-09 DIAGNOSIS — S7292XD Unspecified fracture of left femur, subsequent encounter for closed fracture with routine healing: Secondary | ICD-10-CM | POA: Diagnosis not present

## 2023-11-09 DIAGNOSIS — F0390 Unspecified dementia without behavioral disturbance: Secondary | ICD-10-CM | POA: Diagnosis not present

## 2023-11-09 DIAGNOSIS — S7292XS Unspecified fracture of left femur, sequela: Secondary | ICD-10-CM | POA: Diagnosis not present

## 2023-11-09 DIAGNOSIS — M6281 Muscle weakness (generalized): Secondary | ICD-10-CM | POA: Diagnosis not present

## 2023-11-09 DIAGNOSIS — I82409 Acute embolism and thrombosis of unspecified deep veins of unspecified lower extremity: Secondary | ICD-10-CM | POA: Diagnosis not present

## 2023-11-09 DIAGNOSIS — Z741 Need for assistance with personal care: Secondary | ICD-10-CM | POA: Diagnosis not present

## 2023-11-13 DIAGNOSIS — R293 Abnormal posture: Secondary | ICD-10-CM | POA: Diagnosis not present

## 2023-11-13 DIAGNOSIS — I82409 Acute embolism and thrombosis of unspecified deep veins of unspecified lower extremity: Secondary | ICD-10-CM | POA: Diagnosis not present

## 2023-11-13 DIAGNOSIS — S7292XD Unspecified fracture of left femur, subsequent encounter for closed fracture with routine healing: Secondary | ICD-10-CM | POA: Diagnosis not present

## 2023-11-13 DIAGNOSIS — F0390 Unspecified dementia without behavioral disturbance: Secondary | ICD-10-CM | POA: Diagnosis not present

## 2023-11-13 DIAGNOSIS — R41841 Cognitive communication deficit: Secondary | ICD-10-CM | POA: Diagnosis not present

## 2023-11-13 DIAGNOSIS — M6281 Muscle weakness (generalized): Secondary | ICD-10-CM | POA: Diagnosis not present

## 2023-11-13 DIAGNOSIS — S7292XS Unspecified fracture of left femur, sequela: Secondary | ICD-10-CM | POA: Diagnosis not present

## 2023-11-13 DIAGNOSIS — Z741 Need for assistance with personal care: Secondary | ICD-10-CM | POA: Diagnosis not present

## 2023-11-13 DIAGNOSIS — J188 Other pneumonia, unspecified organism: Secondary | ICD-10-CM | POA: Diagnosis not present

## 2023-11-15 DIAGNOSIS — Z86711 Personal history of pulmonary embolism: Secondary | ICD-10-CM | POA: Diagnosis not present

## 2023-11-15 DIAGNOSIS — F0393 Unspecified dementia, unspecified severity, with mood disturbance: Secondary | ICD-10-CM | POA: Diagnosis not present

## 2023-11-15 DIAGNOSIS — I10 Essential (primary) hypertension: Secondary | ICD-10-CM | POA: Diagnosis not present

## 2023-11-15 DIAGNOSIS — F329 Major depressive disorder, single episode, unspecified: Secondary | ICD-10-CM | POA: Diagnosis not present

## 2023-11-15 DIAGNOSIS — Z86718 Personal history of other venous thrombosis and embolism: Secondary | ICD-10-CM | POA: Diagnosis not present

## 2023-11-15 DIAGNOSIS — Z7901 Long term (current) use of anticoagulants: Secondary | ICD-10-CM | POA: Diagnosis not present

## 2023-11-15 DIAGNOSIS — H4089 Other specified glaucoma: Secondary | ICD-10-CM | POA: Diagnosis not present

## 2023-11-15 DIAGNOSIS — D649 Anemia, unspecified: Secondary | ICD-10-CM | POA: Diagnosis not present

## 2023-11-15 DIAGNOSIS — H9191 Unspecified hearing loss, right ear: Secondary | ICD-10-CM | POA: Diagnosis not present

## 2023-11-24 DIAGNOSIS — D649 Anemia, unspecified: Secondary | ICD-10-CM | POA: Diagnosis not present

## 2023-11-24 DIAGNOSIS — Z86718 Personal history of other venous thrombosis and embolism: Secondary | ICD-10-CM | POA: Diagnosis not present

## 2023-11-24 DIAGNOSIS — H4089 Other specified glaucoma: Secondary | ICD-10-CM | POA: Diagnosis not present

## 2023-11-24 DIAGNOSIS — Z86711 Personal history of pulmonary embolism: Secondary | ICD-10-CM | POA: Diagnosis not present

## 2023-11-24 DIAGNOSIS — H9191 Unspecified hearing loss, right ear: Secondary | ICD-10-CM | POA: Diagnosis not present

## 2023-11-24 DIAGNOSIS — F0393 Unspecified dementia, unspecified severity, with mood disturbance: Secondary | ICD-10-CM | POA: Diagnosis not present

## 2023-11-24 DIAGNOSIS — Z7901 Long term (current) use of anticoagulants: Secondary | ICD-10-CM | POA: Diagnosis not present

## 2023-11-24 DIAGNOSIS — F329 Major depressive disorder, single episode, unspecified: Secondary | ICD-10-CM | POA: Diagnosis not present

## 2023-11-24 DIAGNOSIS — I10 Essential (primary) hypertension: Secondary | ICD-10-CM | POA: Diagnosis not present

## 2023-11-25 DIAGNOSIS — F329 Major depressive disorder, single episode, unspecified: Secondary | ICD-10-CM | POA: Diagnosis not present

## 2023-11-25 DIAGNOSIS — H4089 Other specified glaucoma: Secondary | ICD-10-CM | POA: Diagnosis not present

## 2023-11-25 DIAGNOSIS — Z86718 Personal history of other venous thrombosis and embolism: Secondary | ICD-10-CM | POA: Diagnosis not present

## 2023-11-25 DIAGNOSIS — I10 Essential (primary) hypertension: Secondary | ICD-10-CM | POA: Diagnosis not present

## 2023-11-25 DIAGNOSIS — F0393 Unspecified dementia, unspecified severity, with mood disturbance: Secondary | ICD-10-CM | POA: Diagnosis not present

## 2023-11-25 DIAGNOSIS — Z7901 Long term (current) use of anticoagulants: Secondary | ICD-10-CM | POA: Diagnosis not present

## 2023-11-25 DIAGNOSIS — D649 Anemia, unspecified: Secondary | ICD-10-CM | POA: Diagnosis not present

## 2023-11-25 DIAGNOSIS — Z86711 Personal history of pulmonary embolism: Secondary | ICD-10-CM | POA: Diagnosis not present

## 2023-11-25 DIAGNOSIS — H9191 Unspecified hearing loss, right ear: Secondary | ICD-10-CM | POA: Diagnosis not present

## 2023-12-01 DIAGNOSIS — F329 Major depressive disorder, single episode, unspecified: Secondary | ICD-10-CM | POA: Diagnosis not present

## 2023-12-01 DIAGNOSIS — D649 Anemia, unspecified: Secondary | ICD-10-CM | POA: Diagnosis not present

## 2023-12-01 DIAGNOSIS — I10 Essential (primary) hypertension: Secondary | ICD-10-CM | POA: Diagnosis not present

## 2023-12-01 DIAGNOSIS — Z7901 Long term (current) use of anticoagulants: Secondary | ICD-10-CM | POA: Diagnosis not present

## 2023-12-01 DIAGNOSIS — F0393 Unspecified dementia, unspecified severity, with mood disturbance: Secondary | ICD-10-CM | POA: Diagnosis not present

## 2023-12-01 DIAGNOSIS — Z86711 Personal history of pulmonary embolism: Secondary | ICD-10-CM | POA: Diagnosis not present

## 2023-12-01 DIAGNOSIS — H9191 Unspecified hearing loss, right ear: Secondary | ICD-10-CM | POA: Diagnosis not present

## 2023-12-01 DIAGNOSIS — H4089 Other specified glaucoma: Secondary | ICD-10-CM | POA: Diagnosis not present

## 2023-12-01 DIAGNOSIS — Z86718 Personal history of other venous thrombosis and embolism: Secondary | ICD-10-CM | POA: Diagnosis not present

## 2023-12-03 DIAGNOSIS — H9191 Unspecified hearing loss, right ear: Secondary | ICD-10-CM | POA: Diagnosis not present

## 2023-12-03 DIAGNOSIS — Z7901 Long term (current) use of anticoagulants: Secondary | ICD-10-CM | POA: Diagnosis not present

## 2023-12-03 DIAGNOSIS — D649 Anemia, unspecified: Secondary | ICD-10-CM | POA: Diagnosis not present

## 2023-12-03 DIAGNOSIS — F329 Major depressive disorder, single episode, unspecified: Secondary | ICD-10-CM | POA: Diagnosis not present

## 2023-12-03 DIAGNOSIS — I10 Essential (primary) hypertension: Secondary | ICD-10-CM | POA: Diagnosis not present

## 2023-12-03 DIAGNOSIS — F0393 Unspecified dementia, unspecified severity, with mood disturbance: Secondary | ICD-10-CM | POA: Diagnosis not present

## 2023-12-03 DIAGNOSIS — Z86718 Personal history of other venous thrombosis and embolism: Secondary | ICD-10-CM | POA: Diagnosis not present

## 2023-12-03 DIAGNOSIS — Z86711 Personal history of pulmonary embolism: Secondary | ICD-10-CM | POA: Diagnosis not present

## 2023-12-03 DIAGNOSIS — H4089 Other specified glaucoma: Secondary | ICD-10-CM | POA: Diagnosis not present

## 2023-12-05 DIAGNOSIS — F0393 Unspecified dementia, unspecified severity, with mood disturbance: Secondary | ICD-10-CM | POA: Diagnosis not present

## 2023-12-05 DIAGNOSIS — H4089 Other specified glaucoma: Secondary | ICD-10-CM | POA: Diagnosis not present

## 2023-12-05 DIAGNOSIS — D649 Anemia, unspecified: Secondary | ICD-10-CM | POA: Diagnosis not present

## 2023-12-05 DIAGNOSIS — I10 Essential (primary) hypertension: Secondary | ICD-10-CM | POA: Diagnosis not present

## 2023-12-05 DIAGNOSIS — Z7901 Long term (current) use of anticoagulants: Secondary | ICD-10-CM | POA: Diagnosis not present

## 2023-12-05 DIAGNOSIS — Z86718 Personal history of other venous thrombosis and embolism: Secondary | ICD-10-CM | POA: Diagnosis not present

## 2023-12-05 DIAGNOSIS — Z86711 Personal history of pulmonary embolism: Secondary | ICD-10-CM | POA: Diagnosis not present

## 2023-12-05 DIAGNOSIS — H9191 Unspecified hearing loss, right ear: Secondary | ICD-10-CM | POA: Diagnosis not present

## 2023-12-05 DIAGNOSIS — F329 Major depressive disorder, single episode, unspecified: Secondary | ICD-10-CM | POA: Diagnosis not present

## 2023-12-06 DIAGNOSIS — H4089 Other specified glaucoma: Secondary | ICD-10-CM | POA: Diagnosis not present

## 2023-12-06 DIAGNOSIS — I10 Essential (primary) hypertension: Secondary | ICD-10-CM | POA: Diagnosis not present

## 2023-12-06 DIAGNOSIS — Z86718 Personal history of other venous thrombosis and embolism: Secondary | ICD-10-CM | POA: Diagnosis not present

## 2023-12-06 DIAGNOSIS — Z7901 Long term (current) use of anticoagulants: Secondary | ICD-10-CM | POA: Diagnosis not present

## 2023-12-06 DIAGNOSIS — D649 Anemia, unspecified: Secondary | ICD-10-CM | POA: Diagnosis not present

## 2023-12-06 DIAGNOSIS — F0393 Unspecified dementia, unspecified severity, with mood disturbance: Secondary | ICD-10-CM | POA: Diagnosis not present

## 2023-12-06 DIAGNOSIS — F329 Major depressive disorder, single episode, unspecified: Secondary | ICD-10-CM | POA: Diagnosis not present

## 2023-12-06 DIAGNOSIS — Z86711 Personal history of pulmonary embolism: Secondary | ICD-10-CM | POA: Diagnosis not present

## 2023-12-06 DIAGNOSIS — H9191 Unspecified hearing loss, right ear: Secondary | ICD-10-CM | POA: Diagnosis not present

## 2023-12-13 DIAGNOSIS — I10 Essential (primary) hypertension: Secondary | ICD-10-CM | POA: Diagnosis not present

## 2023-12-13 DIAGNOSIS — Z86711 Personal history of pulmonary embolism: Secondary | ICD-10-CM | POA: Diagnosis not present

## 2023-12-13 DIAGNOSIS — D649 Anemia, unspecified: Secondary | ICD-10-CM | POA: Diagnosis not present

## 2023-12-13 DIAGNOSIS — F329 Major depressive disorder, single episode, unspecified: Secondary | ICD-10-CM | POA: Diagnosis not present

## 2023-12-13 DIAGNOSIS — F0393 Unspecified dementia, unspecified severity, with mood disturbance: Secondary | ICD-10-CM | POA: Diagnosis not present

## 2023-12-13 DIAGNOSIS — H9191 Unspecified hearing loss, right ear: Secondary | ICD-10-CM | POA: Diagnosis not present

## 2023-12-13 DIAGNOSIS — Z86718 Personal history of other venous thrombosis and embolism: Secondary | ICD-10-CM | POA: Diagnosis not present

## 2023-12-13 DIAGNOSIS — H4089 Other specified glaucoma: Secondary | ICD-10-CM | POA: Diagnosis not present

## 2023-12-13 DIAGNOSIS — Z7901 Long term (current) use of anticoagulants: Secondary | ICD-10-CM | POA: Diagnosis not present

## 2023-12-20 DIAGNOSIS — I10 Essential (primary) hypertension: Secondary | ICD-10-CM | POA: Diagnosis not present

## 2023-12-20 DIAGNOSIS — D649 Anemia, unspecified: Secondary | ICD-10-CM | POA: Diagnosis not present

## 2023-12-20 DIAGNOSIS — Z86711 Personal history of pulmonary embolism: Secondary | ICD-10-CM | POA: Diagnosis not present

## 2023-12-20 DIAGNOSIS — Z7901 Long term (current) use of anticoagulants: Secondary | ICD-10-CM | POA: Diagnosis not present

## 2023-12-20 DIAGNOSIS — F329 Major depressive disorder, single episode, unspecified: Secondary | ICD-10-CM | POA: Diagnosis not present

## 2023-12-20 DIAGNOSIS — H9191 Unspecified hearing loss, right ear: Secondary | ICD-10-CM | POA: Diagnosis not present

## 2023-12-20 DIAGNOSIS — H4089 Other specified glaucoma: Secondary | ICD-10-CM | POA: Diagnosis not present

## 2023-12-20 DIAGNOSIS — F0393 Unspecified dementia, unspecified severity, with mood disturbance: Secondary | ICD-10-CM | POA: Diagnosis not present

## 2023-12-20 DIAGNOSIS — Z86718 Personal history of other venous thrombosis and embolism: Secondary | ICD-10-CM | POA: Diagnosis not present

## 2023-12-30 DIAGNOSIS — H353131 Nonexudative age-related macular degeneration, bilateral, early dry stage: Secondary | ICD-10-CM | POA: Diagnosis not present

## 2023-12-30 DIAGNOSIS — Z86711 Personal history of pulmonary embolism: Secondary | ICD-10-CM | POA: Diagnosis not present

## 2023-12-30 DIAGNOSIS — I10 Essential (primary) hypertension: Secondary | ICD-10-CM | POA: Diagnosis not present

## 2023-12-30 DIAGNOSIS — Z86718 Personal history of other venous thrombosis and embolism: Secondary | ICD-10-CM | POA: Diagnosis not present

## 2023-12-30 DIAGNOSIS — H9191 Unspecified hearing loss, right ear: Secondary | ICD-10-CM | POA: Diagnosis not present

## 2023-12-30 DIAGNOSIS — Z7901 Long term (current) use of anticoagulants: Secondary | ICD-10-CM | POA: Diagnosis not present

## 2023-12-30 DIAGNOSIS — D649 Anemia, unspecified: Secondary | ICD-10-CM | POA: Diagnosis not present

## 2023-12-30 DIAGNOSIS — H18503 Unspecified hereditary corneal dystrophies, bilateral: Secondary | ICD-10-CM | POA: Diagnosis not present

## 2023-12-30 DIAGNOSIS — F0393 Unspecified dementia, unspecified severity, with mood disturbance: Secondary | ICD-10-CM | POA: Diagnosis not present

## 2023-12-30 DIAGNOSIS — H53143 Visual discomfort, bilateral: Secondary | ICD-10-CM | POA: Diagnosis not present

## 2023-12-30 DIAGNOSIS — H4089 Other specified glaucoma: Secondary | ICD-10-CM | POA: Diagnosis not present

## 2023-12-30 DIAGNOSIS — F329 Major depressive disorder, single episode, unspecified: Secondary | ICD-10-CM | POA: Diagnosis not present

## 2023-12-30 DIAGNOSIS — H3530 Unspecified macular degeneration: Secondary | ICD-10-CM | POA: Diagnosis not present

## 2024-01-10 DIAGNOSIS — Z86718 Personal history of other venous thrombosis and embolism: Secondary | ICD-10-CM | POA: Diagnosis not present

## 2024-01-10 DIAGNOSIS — I10 Essential (primary) hypertension: Secondary | ICD-10-CM | POA: Diagnosis not present

## 2024-01-10 DIAGNOSIS — H9191 Unspecified hearing loss, right ear: Secondary | ICD-10-CM | POA: Diagnosis not present

## 2024-01-10 DIAGNOSIS — D649 Anemia, unspecified: Secondary | ICD-10-CM | POA: Diagnosis not present

## 2024-01-10 DIAGNOSIS — F329 Major depressive disorder, single episode, unspecified: Secondary | ICD-10-CM | POA: Diagnosis not present

## 2024-01-10 DIAGNOSIS — Z7901 Long term (current) use of anticoagulants: Secondary | ICD-10-CM | POA: Diagnosis not present

## 2024-01-10 DIAGNOSIS — F0393 Unspecified dementia, unspecified severity, with mood disturbance: Secondary | ICD-10-CM | POA: Diagnosis not present

## 2024-01-10 DIAGNOSIS — H4089 Other specified glaucoma: Secondary | ICD-10-CM | POA: Diagnosis not present

## 2024-01-10 DIAGNOSIS — Z86711 Personal history of pulmonary embolism: Secondary | ICD-10-CM | POA: Diagnosis not present

## 2024-01-12 DIAGNOSIS — Z7901 Long term (current) use of anticoagulants: Secondary | ICD-10-CM | POA: Diagnosis not present

## 2024-01-12 DIAGNOSIS — H9191 Unspecified hearing loss, right ear: Secondary | ICD-10-CM | POA: Diagnosis not present

## 2024-01-12 DIAGNOSIS — I10 Essential (primary) hypertension: Secondary | ICD-10-CM | POA: Diagnosis not present

## 2024-01-12 DIAGNOSIS — F329 Major depressive disorder, single episode, unspecified: Secondary | ICD-10-CM | POA: Diagnosis not present

## 2024-01-12 DIAGNOSIS — H4089 Other specified glaucoma: Secondary | ICD-10-CM | POA: Diagnosis not present

## 2024-01-12 DIAGNOSIS — D649 Anemia, unspecified: Secondary | ICD-10-CM | POA: Diagnosis not present

## 2024-01-12 DIAGNOSIS — Z86718 Personal history of other venous thrombosis and embolism: Secondary | ICD-10-CM | POA: Diagnosis not present

## 2024-01-12 DIAGNOSIS — F0393 Unspecified dementia, unspecified severity, with mood disturbance: Secondary | ICD-10-CM | POA: Diagnosis not present

## 2024-01-12 DIAGNOSIS — Z86711 Personal history of pulmonary embolism: Secondary | ICD-10-CM | POA: Diagnosis not present

## 2024-01-17 DIAGNOSIS — D649 Anemia, unspecified: Secondary | ICD-10-CM | POA: Diagnosis not present

## 2024-01-17 DIAGNOSIS — F3341 Major depressive disorder, recurrent, in partial remission: Secondary | ICD-10-CM | POA: Diagnosis not present

## 2024-01-17 DIAGNOSIS — I10 Essential (primary) hypertension: Secondary | ICD-10-CM | POA: Diagnosis not present

## 2024-01-17 DIAGNOSIS — F0390 Unspecified dementia without behavioral disturbance: Secondary | ICD-10-CM | POA: Diagnosis not present

## 2024-01-17 DIAGNOSIS — Z681 Body mass index (BMI) 19 or less, adult: Secondary | ICD-10-CM | POA: Diagnosis not present

## 2024-01-23 DIAGNOSIS — W010XXA Fall on same level from slipping, tripping and stumbling without subsequent striking against object, initial encounter: Secondary | ICD-10-CM | POA: Diagnosis not present

## 2024-01-23 DIAGNOSIS — R296 Repeated falls: Secondary | ICD-10-CM | POA: Diagnosis not present

## 2024-01-23 DIAGNOSIS — S0181XA Laceration without foreign body of other part of head, initial encounter: Secondary | ICD-10-CM | POA: Diagnosis not present

## 2024-01-23 DIAGNOSIS — S0993XA Unspecified injury of face, initial encounter: Secondary | ICD-10-CM | POA: Diagnosis not present

## 2024-01-23 DIAGNOSIS — S0990XA Unspecified injury of head, initial encounter: Secondary | ICD-10-CM | POA: Diagnosis not present

## 2024-01-23 DIAGNOSIS — I10 Essential (primary) hypertension: Secondary | ICD-10-CM | POA: Diagnosis not present

## 2024-01-23 DIAGNOSIS — Z23 Encounter for immunization: Secondary | ICD-10-CM | POA: Diagnosis not present

## 2024-01-25 DIAGNOSIS — H401122 Primary open-angle glaucoma, left eye, moderate stage: Secondary | ICD-10-CM | POA: Diagnosis not present

## 2024-01-25 DIAGNOSIS — S0081XA Abrasion of other part of head, initial encounter: Secondary | ICD-10-CM | POA: Diagnosis not present

## 2024-01-25 DIAGNOSIS — H401113 Primary open-angle glaucoma, right eye, severe stage: Secondary | ICD-10-CM | POA: Diagnosis not present

## 2024-01-27 DIAGNOSIS — R0602 Shortness of breath: Secondary | ICD-10-CM | POA: Diagnosis not present

## 2024-01-27 DIAGNOSIS — R0789 Other chest pain: Secondary | ICD-10-CM | POA: Diagnosis not present

## 2024-01-27 DIAGNOSIS — Z79899 Other long term (current) drug therapy: Secondary | ICD-10-CM | POA: Diagnosis not present

## 2024-01-27 DIAGNOSIS — R071 Chest pain on breathing: Secondary | ICD-10-CM | POA: Diagnosis not present

## 2024-01-27 DIAGNOSIS — M79602 Pain in left arm: Secondary | ICD-10-CM | POA: Diagnosis not present

## 2024-01-27 DIAGNOSIS — S0003XA Contusion of scalp, initial encounter: Secondary | ICD-10-CM | POA: Diagnosis not present

## 2024-01-27 DIAGNOSIS — J984 Other disorders of lung: Secondary | ICD-10-CM | POA: Diagnosis not present

## 2024-01-27 DIAGNOSIS — R42 Dizziness and giddiness: Secondary | ICD-10-CM | POA: Diagnosis not present

## 2024-01-27 DIAGNOSIS — S0512XA Contusion of eyeball and orbital tissues, left eye, initial encounter: Secondary | ICD-10-CM | POA: Diagnosis not present

## 2024-01-27 DIAGNOSIS — R079 Chest pain, unspecified: Secondary | ICD-10-CM | POA: Diagnosis not present

## 2024-01-27 DIAGNOSIS — I10 Essential (primary) hypertension: Secondary | ICD-10-CM | POA: Diagnosis not present

## 2024-02-02 DIAGNOSIS — R296 Repeated falls: Secondary | ICD-10-CM | POA: Diagnosis not present

## 2024-02-02 DIAGNOSIS — R0789 Other chest pain: Secondary | ICD-10-CM | POA: Diagnosis not present

## 2024-02-02 DIAGNOSIS — Z86711 Personal history of pulmonary embolism: Secondary | ICD-10-CM | POA: Diagnosis not present

## 2024-02-02 DIAGNOSIS — R636 Underweight: Secondary | ICD-10-CM | POA: Diagnosis not present

## 2024-02-02 DIAGNOSIS — Z86718 Personal history of other venous thrombosis and embolism: Secondary | ICD-10-CM | POA: Diagnosis not present

## 2024-02-02 DIAGNOSIS — Z7689 Persons encountering health services in other specified circumstances: Secondary | ICD-10-CM | POA: Diagnosis not present

## 2024-02-02 DIAGNOSIS — S0181XD Laceration without foreign body of other part of head, subsequent encounter: Secondary | ICD-10-CM | POA: Diagnosis not present

## 2024-02-02 DIAGNOSIS — H409 Unspecified glaucoma: Secondary | ICD-10-CM | POA: Diagnosis not present

## 2024-02-02 DIAGNOSIS — Z09 Encounter for follow-up examination after completed treatment for conditions other than malignant neoplasm: Secondary | ICD-10-CM | POA: Diagnosis not present

## 2024-02-07 DIAGNOSIS — H401122 Primary open-angle glaucoma, left eye, moderate stage: Secondary | ICD-10-CM | POA: Diagnosis not present

## 2024-02-07 DIAGNOSIS — H401113 Primary open-angle glaucoma, right eye, severe stage: Secondary | ICD-10-CM | POA: Diagnosis not present

## 2024-02-14 DIAGNOSIS — H9190 Unspecified hearing loss, unspecified ear: Secondary | ICD-10-CM | POA: Diagnosis not present

## 2024-02-14 DIAGNOSIS — Z85828 Personal history of other malignant neoplasm of skin: Secondary | ICD-10-CM | POA: Diagnosis not present

## 2024-02-14 DIAGNOSIS — F0393 Unspecified dementia, unspecified severity, with mood disturbance: Secondary | ICD-10-CM | POA: Diagnosis not present

## 2024-02-14 DIAGNOSIS — H543 Unqualified visual loss, both eyes: Secondary | ICD-10-CM | POA: Diagnosis not present

## 2024-02-14 DIAGNOSIS — R296 Repeated falls: Secondary | ICD-10-CM | POA: Diagnosis not present

## 2024-02-14 DIAGNOSIS — I10 Essential (primary) hypertension: Secondary | ICD-10-CM | POA: Diagnosis not present

## 2024-02-14 DIAGNOSIS — F3341 Major depressive disorder, recurrent, in partial remission: Secondary | ICD-10-CM | POA: Diagnosis not present

## 2024-02-14 DIAGNOSIS — D649 Anemia, unspecified: Secondary | ICD-10-CM | POA: Diagnosis not present

## 2024-02-14 DIAGNOSIS — H409 Unspecified glaucoma: Secondary | ICD-10-CM | POA: Diagnosis not present

## 2024-02-15 DIAGNOSIS — M25562 Pain in left knee: Secondary | ICD-10-CM | POA: Diagnosis not present

## 2024-02-15 DIAGNOSIS — M9702XD Periprosthetic fracture around internal prosthetic left hip joint, subsequent encounter: Secondary | ICD-10-CM | POA: Diagnosis not present

## 2024-03-08 DIAGNOSIS — R0989 Other specified symptoms and signs involving the circulatory and respiratory systems: Secondary | ICD-10-CM | POA: Diagnosis not present

## 2024-03-08 DIAGNOSIS — Z86718 Personal history of other venous thrombosis and embolism: Secondary | ICD-10-CM | POA: Diagnosis not present

## 2024-03-08 DIAGNOSIS — R630 Anorexia: Secondary | ICD-10-CM | POA: Diagnosis not present

## 2024-03-08 DIAGNOSIS — I10 Essential (primary) hypertension: Secondary | ICD-10-CM | POA: Diagnosis not present

## 2024-03-15 DIAGNOSIS — Z85828 Personal history of other malignant neoplasm of skin: Secondary | ICD-10-CM | POA: Diagnosis not present

## 2024-03-15 DIAGNOSIS — H9190 Unspecified hearing loss, unspecified ear: Secondary | ICD-10-CM | POA: Diagnosis not present

## 2024-03-15 DIAGNOSIS — I10 Essential (primary) hypertension: Secondary | ICD-10-CM | POA: Diagnosis not present

## 2024-03-15 DIAGNOSIS — H409 Unspecified glaucoma: Secondary | ICD-10-CM | POA: Diagnosis not present

## 2024-03-15 DIAGNOSIS — F0393 Unspecified dementia, unspecified severity, with mood disturbance: Secondary | ICD-10-CM | POA: Diagnosis not present

## 2024-03-15 DIAGNOSIS — F3341 Major depressive disorder, recurrent, in partial remission: Secondary | ICD-10-CM | POA: Diagnosis not present

## 2024-03-15 DIAGNOSIS — D649 Anemia, unspecified: Secondary | ICD-10-CM | POA: Diagnosis not present

## 2024-03-15 DIAGNOSIS — R296 Repeated falls: Secondary | ICD-10-CM | POA: Diagnosis not present

## 2024-03-15 DIAGNOSIS — H543 Unqualified visual loss, both eyes: Secondary | ICD-10-CM | POA: Diagnosis not present

## 2024-03-16 DIAGNOSIS — H9190 Unspecified hearing loss, unspecified ear: Secondary | ICD-10-CM | POA: Diagnosis not present

## 2024-03-16 DIAGNOSIS — H409 Unspecified glaucoma: Secondary | ICD-10-CM | POA: Diagnosis not present

## 2024-03-16 DIAGNOSIS — F3341 Major depressive disorder, recurrent, in partial remission: Secondary | ICD-10-CM | POA: Diagnosis not present

## 2024-03-16 DIAGNOSIS — R296 Repeated falls: Secondary | ICD-10-CM | POA: Diagnosis not present

## 2024-03-16 DIAGNOSIS — D649 Anemia, unspecified: Secondary | ICD-10-CM | POA: Diagnosis not present

## 2024-03-16 DIAGNOSIS — Z85828 Personal history of other malignant neoplasm of skin: Secondary | ICD-10-CM | POA: Diagnosis not present

## 2024-03-16 DIAGNOSIS — F0393 Unspecified dementia, unspecified severity, with mood disturbance: Secondary | ICD-10-CM | POA: Diagnosis not present

## 2024-03-16 DIAGNOSIS — H543 Unqualified visual loss, both eyes: Secondary | ICD-10-CM | POA: Diagnosis not present

## 2024-03-16 DIAGNOSIS — I10 Essential (primary) hypertension: Secondary | ICD-10-CM | POA: Diagnosis not present

## 2024-03-23 DIAGNOSIS — R296 Repeated falls: Secondary | ICD-10-CM | POA: Diagnosis not present

## 2024-03-28 DIAGNOSIS — H9190 Unspecified hearing loss, unspecified ear: Secondary | ICD-10-CM | POA: Diagnosis not present

## 2024-03-28 DIAGNOSIS — Z85828 Personal history of other malignant neoplasm of skin: Secondary | ICD-10-CM | POA: Diagnosis not present

## 2024-03-28 DIAGNOSIS — R296 Repeated falls: Secondary | ICD-10-CM | POA: Diagnosis not present

## 2024-04-03 DIAGNOSIS — H401122 Primary open-angle glaucoma, left eye, moderate stage: Secondary | ICD-10-CM | POA: Diagnosis not present

## 2024-04-03 DIAGNOSIS — H401113 Primary open-angle glaucoma, right eye, severe stage: Secondary | ICD-10-CM | POA: Diagnosis not present

## 2024-04-04 DIAGNOSIS — M79609 Pain in unspecified limb: Secondary | ICD-10-CM | POA: Diagnosis not present

## 2024-04-04 DIAGNOSIS — M79674 Pain in right toe(s): Secondary | ICD-10-CM | POA: Diagnosis not present

## 2024-04-04 DIAGNOSIS — B351 Tinea unguium: Secondary | ICD-10-CM | POA: Diagnosis not present

## 2024-04-04 DIAGNOSIS — M79675 Pain in left toe(s): Secondary | ICD-10-CM | POA: Diagnosis not present

## 2024-04-10 DIAGNOSIS — R519 Headache, unspecified: Secondary | ICD-10-CM | POA: Diagnosis not present

## 2024-04-10 DIAGNOSIS — M170 Bilateral primary osteoarthritis of knee: Secondary | ICD-10-CM | POA: Diagnosis not present

## 2024-04-10 DIAGNOSIS — R630 Anorexia: Secondary | ICD-10-CM | POA: Diagnosis not present

## 2024-04-10 DIAGNOSIS — K1379 Other lesions of oral mucosa: Secondary | ICD-10-CM | POA: Diagnosis not present

## 2024-05-25 DIAGNOSIS — Z0000 Encounter for general adult medical examination without abnormal findings: Secondary | ICD-10-CM | POA: Diagnosis not present

## 2024-05-25 DIAGNOSIS — H6121 Impacted cerumen, right ear: Secondary | ICD-10-CM | POA: Diagnosis not present

## 2024-05-25 DIAGNOSIS — I10 Essential (primary) hypertension: Secondary | ICD-10-CM | POA: Diagnosis not present

## 2024-05-25 DIAGNOSIS — H04123 Dry eye syndrome of bilateral lacrimal glands: Secondary | ICD-10-CM | POA: Diagnosis not present

## 2024-05-25 DIAGNOSIS — R3915 Urgency of urination: Secondary | ICD-10-CM | POA: Diagnosis not present

## 2024-06-06 DIAGNOSIS — H6121 Impacted cerumen, right ear: Secondary | ICD-10-CM | POA: Diagnosis not present

## 2024-06-06 DIAGNOSIS — N3000 Acute cystitis without hematuria: Secondary | ICD-10-CM | POA: Diagnosis not present

## 2024-06-06 DIAGNOSIS — M25552 Pain in left hip: Secondary | ICD-10-CM | POA: Diagnosis not present

## 2024-09-07 DIAGNOSIS — Z23 Encounter for immunization: Secondary | ICD-10-CM | POA: Diagnosis not present

## 2024-09-07 DIAGNOSIS — R8290 Unspecified abnormal findings in urine: Secondary | ICD-10-CM | POA: Diagnosis not present

## 2024-09-07 DIAGNOSIS — K5900 Constipation, unspecified: Secondary | ICD-10-CM | POA: Diagnosis not present

## 2024-09-07 DIAGNOSIS — L989 Disorder of the skin and subcutaneous tissue, unspecified: Secondary | ICD-10-CM | POA: Diagnosis not present

## 2024-09-07 DIAGNOSIS — I10 Essential (primary) hypertension: Secondary | ICD-10-CM | POA: Diagnosis not present
